# Patient Record
Sex: Female | Born: 1950 | Race: White | Hispanic: No | Marital: Married | Smoking: Former smoker
Health system: Southern US, Community
[De-identification: ages and names within clinical notes are randomized; demographics above are authoritative.]

## PROBLEM LIST (undated history)

## (undated) DIAGNOSIS — E785 Hyperlipidemia, unspecified: Secondary | ICD-10-CM

## (undated) DIAGNOSIS — F419 Anxiety disorder, unspecified: Secondary | ICD-10-CM

## (undated) DIAGNOSIS — I7300 Raynaud's syndrome without gangrene: Secondary | ICD-10-CM

## (undated) DIAGNOSIS — M8580 Other specified disorders of bone density and structure, unspecified site: Secondary | ICD-10-CM

## (undated) DIAGNOSIS — M47816 Spondylosis without myelopathy or radiculopathy, lumbar region: Secondary | ICD-10-CM

## (undated) DIAGNOSIS — H9193 Unspecified hearing loss, bilateral: Secondary | ICD-10-CM

## (undated) DIAGNOSIS — I8000 Phlebitis and thrombophlebitis of superficial vessels of unspecified lower extremity: Secondary | ICD-10-CM

## (undated) DIAGNOSIS — S5420XA Injury of radial nerve at forearm level, unspecified arm, initial encounter: Secondary | ICD-10-CM

## (undated) DIAGNOSIS — R7302 Impaired glucose tolerance (oral): Secondary | ICD-10-CM

## (undated) DIAGNOSIS — C801 Malignant (primary) neoplasm, unspecified: Secondary | ICD-10-CM

## (undated) DIAGNOSIS — K5792 Diverticulitis of intestine, part unspecified, without perforation or abscess without bleeding: Secondary | ICD-10-CM

## (undated) DIAGNOSIS — Z9889 Other specified postprocedural states: Secondary | ICD-10-CM

## (undated) DIAGNOSIS — I82409 Acute embolism and thrombosis of unspecified deep veins of unspecified lower extremity: Secondary | ICD-10-CM

## (undated) DIAGNOSIS — K5730 Diverticulosis of large intestine without perforation or abscess without bleeding: Secondary | ICD-10-CM

## (undated) DIAGNOSIS — R112 Nausea with vomiting, unspecified: Secondary | ICD-10-CM

## (undated) HISTORY — DX: Phlebitis and thrombophlebitis of superficial vessels of unspecified lower extremity: I80.00

## (undated) HISTORY — DX: Injury of radial nerve at forearm level, unspecified arm, initial encounter: S54.20XA

## (undated) HISTORY — DX: Impaired glucose tolerance (oral): R73.02

## (undated) HISTORY — DX: Anxiety disorder, unspecified: F41.9

## (undated) HISTORY — DX: Raynaud's syndrome without gangrene: I73.00

## (undated) HISTORY — DX: Acute embolism and thrombosis of unspecified deep veins of unspecified lower extremity: I82.409

## (undated) HISTORY — PX: BREAST SURGERY: SHX581

## (undated) HISTORY — DX: Spondylosis without myelopathy or radiculopathy, lumbar region: M47.816

## (undated) HISTORY — DX: Diverticulosis of large intestine without perforation or abscess without bleeding: K57.30

## (undated) HISTORY — DX: Diverticulitis of intestine, part unspecified, without perforation or abscess without bleeding: K57.92

## (undated) HISTORY — PX: TONSILLECTOMY: SHX5217

## (undated) HISTORY — DX: Malignant (primary) neoplasm, unspecified: C80.1

## (undated) HISTORY — DX: Unspecified hearing loss, bilateral: H91.93

## (undated) HISTORY — DX: Other specified postprocedural states: R11.2

## (undated) HISTORY — DX: Other specified postprocedural states: Z98.890

## (undated) HISTORY — DX: Hyperlipidemia, unspecified: E78.5

## (undated) HISTORY — DX: Other specified disorders of bone density and structure, unspecified site: M85.80

---

## 1975-06-17 DIAGNOSIS — I82409 Acute embolism and thrombosis of unspecified deep veins of unspecified lower extremity: Secondary | ICD-10-CM

## 1975-06-17 HISTORY — DX: Acute embolism and thrombosis of unspecified deep veins of unspecified lower extremity: I82.409

## 1981-06-16 HISTORY — PX: TOTAL ABDOMINAL HYSTERECTOMY W/ BILATERAL SALPINGOOPHORECTOMY: SHX83

## 1981-06-16 HISTORY — PX: HEMORRHOID SURGERY: SHX153

## 1998-06-07 ENCOUNTER — Other Ambulatory Visit: Admission: RE | Admit: 1998-06-07 | Discharge: 1998-06-07 | Payer: Self-pay | Admitting: Gynecology

## 1999-06-12 ENCOUNTER — Ambulatory Visit (HOSPITAL_COMMUNITY): Admission: RE | Admit: 1999-06-12 | Discharge: 1999-06-12 | Payer: Self-pay | Admitting: Neurosurgery

## 1999-06-12 ENCOUNTER — Encounter: Payer: Self-pay | Admitting: Neurosurgery

## 1999-06-24 ENCOUNTER — Other Ambulatory Visit: Admission: RE | Admit: 1999-06-24 | Discharge: 1999-06-24 | Payer: Self-pay | Admitting: Gynecology

## 1999-06-28 ENCOUNTER — Ambulatory Visit (HOSPITAL_COMMUNITY): Admission: RE | Admit: 1999-06-28 | Discharge: 1999-06-28 | Payer: Self-pay | Admitting: Neurosurgery

## 1999-06-28 ENCOUNTER — Encounter: Payer: Self-pay | Admitting: Neurosurgery

## 1999-07-12 ENCOUNTER — Ambulatory Visit (HOSPITAL_COMMUNITY): Admission: RE | Admit: 1999-07-12 | Discharge: 1999-07-12 | Payer: Self-pay | Admitting: Neurosurgery

## 1999-07-12 ENCOUNTER — Encounter: Payer: Self-pay | Admitting: Neurosurgery

## 2000-06-16 HISTORY — PX: BACK SURGERY: SHX140

## 2000-06-29 ENCOUNTER — Other Ambulatory Visit: Admission: RE | Admit: 2000-06-29 | Discharge: 2000-06-29 | Payer: Self-pay | Admitting: Gynecology

## 2000-09-26 ENCOUNTER — Ambulatory Visit (HOSPITAL_COMMUNITY): Admission: RE | Admit: 2000-09-26 | Discharge: 2000-09-26 | Payer: Self-pay | Admitting: Neurosurgery

## 2000-09-26 ENCOUNTER — Encounter: Payer: Self-pay | Admitting: Neurosurgery

## 2000-12-18 ENCOUNTER — Encounter: Payer: Self-pay | Admitting: Neurosurgery

## 2000-12-22 ENCOUNTER — Encounter: Payer: Self-pay | Admitting: Neurosurgery

## 2000-12-22 ENCOUNTER — Inpatient Hospital Stay (HOSPITAL_COMMUNITY): Admission: RE | Admit: 2000-12-22 | Discharge: 2000-12-26 | Payer: Self-pay | Admitting: Neurosurgery

## 2001-01-06 ENCOUNTER — Encounter: Admission: RE | Admit: 2001-01-06 | Discharge: 2001-01-06 | Payer: Self-pay | Admitting: Neurosurgery

## 2001-01-06 ENCOUNTER — Encounter: Payer: Self-pay | Admitting: Neurosurgery

## 2001-07-05 ENCOUNTER — Other Ambulatory Visit: Admission: RE | Admit: 2001-07-05 | Discharge: 2001-07-05 | Payer: Self-pay | Admitting: Gynecology

## 2002-07-14 ENCOUNTER — Other Ambulatory Visit: Admission: RE | Admit: 2002-07-14 | Discharge: 2002-07-14 | Payer: Self-pay | Admitting: Gynecology

## 2002-11-11 ENCOUNTER — Ambulatory Visit (HOSPITAL_COMMUNITY): Admission: RE | Admit: 2002-11-11 | Discharge: 2002-11-11 | Payer: Self-pay | Admitting: Internal Medicine

## 2003-07-31 ENCOUNTER — Other Ambulatory Visit: Admission: RE | Admit: 2003-07-31 | Discharge: 2003-07-31 | Payer: Self-pay | Admitting: Gynecology

## 2004-08-14 ENCOUNTER — Other Ambulatory Visit: Admission: RE | Admit: 2004-08-14 | Discharge: 2004-08-14 | Payer: Self-pay | Admitting: Gynecology

## 2004-10-15 ENCOUNTER — Ambulatory Visit: Payer: Self-pay | Admitting: Internal Medicine

## 2004-10-28 ENCOUNTER — Ambulatory Visit: Payer: Self-pay | Admitting: Internal Medicine

## 2004-11-05 ENCOUNTER — Ambulatory Visit (HOSPITAL_COMMUNITY): Admission: RE | Admit: 2004-11-05 | Discharge: 2004-11-05 | Payer: Self-pay | Admitting: Internal Medicine

## 2005-03-25 ENCOUNTER — Ambulatory Visit: Payer: Self-pay | Admitting: Internal Medicine

## 2005-05-01 ENCOUNTER — Ambulatory Visit (HOSPITAL_COMMUNITY): Admission: RE | Admit: 2005-05-01 | Discharge: 2005-05-02 | Payer: Self-pay | Admitting: Ophthalmology

## 2005-08-19 ENCOUNTER — Other Ambulatory Visit: Admission: RE | Admit: 2005-08-19 | Discharge: 2005-08-19 | Payer: Self-pay | Admitting: Gynecology

## 2006-08-24 ENCOUNTER — Other Ambulatory Visit: Admission: RE | Admit: 2006-08-24 | Discharge: 2006-08-24 | Payer: Self-pay | Admitting: Gynecology

## 2006-12-03 ENCOUNTER — Ambulatory Visit: Payer: Self-pay | Admitting: Internal Medicine

## 2006-12-03 LAB — CONVERTED CEMR LAB
ALT: 54 units/L — ABNORMAL HIGH (ref 0–40)
AST: 50 units/L — ABNORMAL HIGH (ref 0–37)
Basophils Absolute: 0 10*3/uL (ref 0.0–0.1)
Bilirubin Urine: NEGATIVE
Bilirubin, Direct: 0.1 mg/dL (ref 0.0–0.3)
CO2: 31 meq/L (ref 19–32)
Calcium: 9.4 mg/dL (ref 8.4–10.5)
Direct LDL: 139.4 mg/dL
Eosinophils Absolute: 0.1 10*3/uL (ref 0.0–0.6)
Eosinophils Relative: 2.5 % (ref 0.0–5.0)
GFR calc Af Amer: 95 mL/min
Glucose, Bld: 87 mg/dL (ref 70–99)
HCT: 41.3 % (ref 36.0–46.0)
Hemoglobin, Urine: NEGATIVE
Ketones, ur: NEGATIVE mg/dL
Lymphocytes Relative: 20.6 % (ref 12.0–46.0)
MCHC: 34 g/dL (ref 30.0–36.0)
MCV: 89.3 fL (ref 78.0–100.0)
Monocytes Absolute: 0.3 10*3/uL (ref 0.2–0.7)
Mucus, UA: NEGATIVE
Neutro Abs: 3.3 10*3/uL (ref 1.4–7.7)
Neutrophils Relative %: 68.6 % (ref 43.0–77.0)
Nitrite: NEGATIVE
RBC: 4.63 M/uL (ref 3.87–5.11)
Sodium: 144 meq/L (ref 135–145)
Total Protein, Urine: NEGATIVE mg/dL
Total Protein: 6.6 g/dL (ref 6.0–8.3)
Triglycerides: 181 mg/dL — ABNORMAL HIGH (ref 0–149)
Urine Glucose: NEGATIVE mg/dL
Urobilinogen, UA: 0.2 (ref 0.0–1.0)
WBC: 4.6 10*3/uL (ref 4.5–10.5)
pH: 6.5 (ref 5.0–8.0)

## 2006-12-07 ENCOUNTER — Ambulatory Visit: Payer: Self-pay | Admitting: Internal Medicine

## 2006-12-07 LAB — CONVERTED CEMR LAB
Albumin: 4.1 g/dL (ref 3.5–5.2)
Hep A IgM: NEGATIVE
Hep B C IgM: NEGATIVE
Hepatitis B Surface Ag: NEGATIVE
Total Bilirubin: 0.4 mg/dL (ref 0.3–1.2)
Total Protein: 6.7 g/dL (ref 6.0–8.3)

## 2006-12-09 ENCOUNTER — Ambulatory Visit: Payer: Self-pay | Admitting: Gastroenterology

## 2007-09-01 ENCOUNTER — Ambulatory Visit: Payer: Self-pay | Admitting: Internal Medicine

## 2007-09-01 DIAGNOSIS — I8000 Phlebitis and thrombophlebitis of superficial vessels of unspecified lower extremity: Secondary | ICD-10-CM | POA: Insufficient documentation

## 2007-09-01 DIAGNOSIS — E739 Lactose intolerance, unspecified: Secondary | ICD-10-CM

## 2007-09-01 DIAGNOSIS — K649 Unspecified hemorrhoids: Secondary | ICD-10-CM | POA: Insufficient documentation

## 2007-09-01 DIAGNOSIS — J189 Pneumonia, unspecified organism: Secondary | ICD-10-CM

## 2007-09-01 DIAGNOSIS — K5730 Diverticulosis of large intestine without perforation or abscess without bleeding: Secondary | ICD-10-CM | POA: Insufficient documentation

## 2007-09-01 DIAGNOSIS — E785 Hyperlipidemia, unspecified: Secondary | ICD-10-CM | POA: Insufficient documentation

## 2007-09-01 DIAGNOSIS — I82409 Acute embolism and thrombosis of unspecified deep veins of unspecified lower extremity: Secondary | ICD-10-CM | POA: Insufficient documentation

## 2007-09-01 DIAGNOSIS — Z8719 Personal history of other diseases of the digestive system: Secondary | ICD-10-CM

## 2007-09-01 DIAGNOSIS — I7300 Raynaud's syndrome without gangrene: Secondary | ICD-10-CM | POA: Insufficient documentation

## 2007-09-01 DIAGNOSIS — Z87898 Personal history of other specified conditions: Secondary | ICD-10-CM

## 2007-09-01 DIAGNOSIS — Z86718 Personal history of other venous thrombosis and embolism: Secondary | ICD-10-CM

## 2007-09-01 DIAGNOSIS — F411 Generalized anxiety disorder: Secondary | ICD-10-CM

## 2007-09-01 DIAGNOSIS — J309 Allergic rhinitis, unspecified: Secondary | ICD-10-CM

## 2007-09-01 DIAGNOSIS — J9801 Acute bronchospasm: Secondary | ICD-10-CM | POA: Insufficient documentation

## 2007-09-01 HISTORY — DX: Phlebitis and thrombophlebitis of superficial vessels of unspecified lower extremity: I80.00

## 2007-12-20 ENCOUNTER — Telehealth: Payer: Self-pay | Admitting: Internal Medicine

## 2007-12-20 ENCOUNTER — Ambulatory Visit: Payer: Self-pay | Admitting: Internal Medicine

## 2007-12-23 LAB — CONVERTED CEMR LAB
ALT: 17 units/L (ref 0–35)
Basophils Relative: 0.6 % (ref 0.0–1.0)
Bilirubin, Direct: 0.1 mg/dL (ref 0.0–0.3)
CO2: 31 meq/L (ref 19–32)
Calcium: 9.6 mg/dL (ref 8.4–10.5)
Eosinophils Relative: 3.1 % (ref 0.0–5.0)
Glucose, Bld: 91 mg/dL (ref 70–99)
Hemoglobin: 14.1 g/dL (ref 12.0–15.0)
Lymphocytes Relative: 25.1 % (ref 12.0–46.0)
Monocytes Relative: 8.3 % (ref 3.0–12.0)
Neutro Abs: 2.5 10*3/uL (ref 1.4–7.7)
Nitrite: NEGATIVE
RBC: 4.5 M/uL (ref 3.87–5.11)
Sodium: 146 meq/L — ABNORMAL HIGH (ref 135–145)
Specific Gravity, Urine: 1.015 (ref 1.000–1.03)
TSH: 1.89 microintl units/mL (ref 0.35–5.50)
Total CHOL/HDL Ratio: 5.2
Total Protein: 6.4 g/dL (ref 6.0–8.3)
WBC: 3.9 10*3/uL — ABNORMAL LOW (ref 4.5–10.5)
pH: 8 (ref 5.0–8.0)

## 2008-01-04 ENCOUNTER — Ambulatory Visit: Payer: Self-pay | Admitting: Internal Medicine

## 2008-01-04 DIAGNOSIS — J019 Acute sinusitis, unspecified: Secondary | ICD-10-CM

## 2008-03-03 ENCOUNTER — Ambulatory Visit: Payer: Self-pay | Admitting: Internal Medicine

## 2008-03-03 LAB — CONVERTED CEMR LAB
HDL: 43.7 mg/dL (ref >39.0)
LDL Cholesterol: 103 mg/dL — ABNORMAL HIGH (ref 0–99)
Total Bilirubin: 0.6 mg/dL (ref 0.3–1.2)
Total CHOL/HDL Ratio: 3.8
VLDL: 17 mg/dL (ref 0–40)

## 2008-03-13 ENCOUNTER — Telehealth (INDEPENDENT_AMBULATORY_CARE_PROVIDER_SITE_OTHER): Payer: Self-pay | Admitting: *Deleted

## 2008-03-14 ENCOUNTER — Ambulatory Visit: Payer: Self-pay | Admitting: Internal Medicine

## 2008-03-14 DIAGNOSIS — S5420XA Injury of radial nerve at forearm level, unspecified arm, initial encounter: Secondary | ICD-10-CM | POA: Insufficient documentation

## 2009-06-07 ENCOUNTER — Ambulatory Visit: Payer: Self-pay | Admitting: Internal Medicine

## 2009-06-07 DIAGNOSIS — R062 Wheezing: Secondary | ICD-10-CM

## 2009-06-16 HISTORY — PX: BREAST LUMPECTOMY: SHX2

## 2009-06-27 ENCOUNTER — Encounter: Payer: Self-pay | Admitting: Internal Medicine

## 2009-07-09 ENCOUNTER — Encounter: Payer: Self-pay | Admitting: Internal Medicine

## 2009-07-13 ENCOUNTER — Encounter: Payer: Self-pay | Admitting: Internal Medicine

## 2009-07-16 ENCOUNTER — Encounter: Payer: Self-pay | Admitting: Internal Medicine

## 2009-07-18 ENCOUNTER — Encounter: Admission: RE | Admit: 2009-07-18 | Discharge: 2009-07-18 | Payer: Self-pay | Admitting: Radiology

## 2009-07-18 ENCOUNTER — Ambulatory Visit: Payer: Self-pay | Admitting: Oncology

## 2009-07-23 ENCOUNTER — Encounter: Payer: Self-pay | Admitting: Internal Medicine

## 2009-07-25 ENCOUNTER — Telehealth: Payer: Self-pay | Admitting: Internal Medicine

## 2009-07-25 ENCOUNTER — Ambulatory Visit (HOSPITAL_COMMUNITY): Admission: RE | Admit: 2009-07-25 | Discharge: 2009-07-25 | Payer: Self-pay | Admitting: Oncology

## 2009-07-25 DIAGNOSIS — C50919 Malignant neoplasm of unspecified site of unspecified female breast: Secondary | ICD-10-CM | POA: Insufficient documentation

## 2009-08-01 ENCOUNTER — Encounter: Payer: Self-pay | Admitting: Internal Medicine

## 2009-08-01 LAB — CBC WITH DIFFERENTIAL/PLATELET
Basophils Absolute: 0 10*3/uL (ref 0.0–0.1)
Eosinophils Absolute: 0 10*3/uL (ref 0.0–0.5)
HCT: 39.4 % (ref 34.8–46.6)
HGB: 13.6 g/dL (ref 11.6–15.9)
LYMPH%: 24.2 % (ref 14.0–49.7)
MCV: 90 fL (ref 79.5–101.0)
MONO#: 0.2 10*3/uL (ref 0.1–0.9)
MONO%: 6.7 % (ref 0.0–14.0)
NEUT#: 2.5 10*3/uL (ref 1.5–6.5)
Platelets: 259 10*3/uL (ref 145–400)

## 2009-08-01 LAB — COMPREHENSIVE METABOLIC PANEL
Albumin: 4.7 g/dL (ref 3.5–5.2)
Alkaline Phosphatase: 46 U/L (ref 39–117)
BUN: 10 mg/dL (ref 6–23)
CO2: 26 mEq/L (ref 19–32)
Glucose, Bld: 90 mg/dL (ref 70–99)
Total Bilirubin: 0.4 mg/dL (ref 0.3–1.2)

## 2009-08-01 LAB — CANCER ANTIGEN 27.29: CA 27.29: 9 U/mL (ref 0–39)

## 2009-08-08 ENCOUNTER — Ambulatory Visit: Admission: RE | Admit: 2009-08-08 | Discharge: 2009-08-30 | Payer: Self-pay | Admitting: Radiation Oncology

## 2009-08-08 ENCOUNTER — Ambulatory Visit (HOSPITAL_COMMUNITY): Admission: RE | Admit: 2009-08-08 | Discharge: 2009-08-08 | Payer: Self-pay | Admitting: General Surgery

## 2009-08-10 ENCOUNTER — Encounter: Payer: Self-pay | Admitting: Internal Medicine

## 2009-08-15 ENCOUNTER — Encounter: Payer: Self-pay | Admitting: Oncology

## 2009-08-15 ENCOUNTER — Ambulatory Visit: Payer: Self-pay | Admitting: Cardiovascular Disease

## 2009-08-15 ENCOUNTER — Ambulatory Visit: Admission: RE | Admit: 2009-08-15 | Discharge: 2009-08-15 | Payer: Self-pay | Admitting: Oncology

## 2009-08-18 ENCOUNTER — Ambulatory Visit: Payer: Self-pay | Admitting: Oncology

## 2009-08-23 LAB — CBC WITH DIFFERENTIAL/PLATELET
BASO%: 2.2 % — ABNORMAL HIGH (ref 0.0–2.0)
EOS%: 10.8 % — ABNORMAL HIGH (ref 0.0–7.0)
HCT: 32.8 % — ABNORMAL LOW (ref 34.8–46.6)
LYMPH%: 48.4 % (ref 14.0–49.7)
MCH: 29.9 pg (ref 25.1–34.0)
MCHC: 34.1 g/dL (ref 31.5–36.0)
MONO#: 0.1 10*3/uL (ref 0.1–0.9)
NEUT%: 30 % — ABNORMAL LOW (ref 38.4–76.8)
Platelets: 114 10*3/uL — ABNORMAL LOW (ref 145–400)
RBC: 3.75 10*6/uL (ref 3.70–5.45)
WBC: 0.9 10*3/uL — CL (ref 3.9–10.3)
nRBC: 0 % (ref 0–0)

## 2009-08-26 ENCOUNTER — Encounter: Payer: Self-pay | Admitting: Internal Medicine

## 2009-08-30 ENCOUNTER — Encounter: Payer: Self-pay | Admitting: Internal Medicine

## 2009-08-30 LAB — CBC WITH DIFFERENTIAL/PLATELET
Basophils Absolute: 0 10*3/uL (ref 0.0–0.1)
EOS%: 0.1 % (ref 0.0–7.0)
HCT: 38.7 % (ref 34.8–46.6)
HGB: 12.9 g/dL (ref 11.6–15.9)
MCH: 29.5 pg (ref 25.1–34.0)
MCV: 88.6 fL (ref 79.5–101.0)
MONO%: 6.3 % (ref 0.0–14.0)
NEUT%: 80.5 % — ABNORMAL HIGH (ref 38.4–76.8)

## 2009-09-06 ENCOUNTER — Encounter: Payer: Self-pay | Admitting: Internal Medicine

## 2009-09-06 LAB — CBC WITH DIFFERENTIAL/PLATELET
Basophils Absolute: 0 10*3/uL (ref 0.0–0.1)
EOS%: 0 % (ref 0.0–7.0)
HGB: 10.3 g/dL — ABNORMAL LOW (ref 11.6–15.9)
MCH: 29.8 pg (ref 25.1–34.0)
MCV: 89 fL (ref 79.5–101.0)
MONO%: 20.7 % — ABNORMAL HIGH (ref 0.0–14.0)
RBC: 3.46 10*6/uL — ABNORMAL LOW (ref 3.70–5.45)
RDW: 12.5 % (ref 11.2–14.5)

## 2009-09-13 ENCOUNTER — Encounter: Payer: Self-pay | Admitting: Internal Medicine

## 2009-09-13 LAB — CBC WITH DIFFERENTIAL/PLATELET
BASO%: 0.9 % (ref 0.0–2.0)
EOS%: 0.1 % (ref 0.0–7.0)
LYMPH%: 10.2 % — ABNORMAL LOW (ref 14.0–49.7)
MCH: 30 pg (ref 25.1–34.0)
MCHC: 33.2 g/dL (ref 31.5–36.0)
MONO#: 1 10*3/uL — ABNORMAL HIGH (ref 0.1–0.9)
MONO%: 10.7 % (ref 0.0–14.0)
Platelets: 184 10*3/uL (ref 145–400)
RBC: 4.03 10*6/uL (ref 3.70–5.45)
WBC: 9.3 10*3/uL (ref 3.9–10.3)
nRBC: 0 % (ref 0–0)

## 2009-09-13 LAB — COMPREHENSIVE METABOLIC PANEL
ALT: 51 U/L — ABNORMAL HIGH (ref 0–35)
AST: 34 U/L (ref 0–37)
Alkaline Phosphatase: 110 U/L (ref 39–117)
Chloride: 103 mEq/L (ref 96–112)
Creatinine, Ser: 0.6 mg/dL (ref 0.40–1.20)
Total Bilirubin: 0.4 mg/dL (ref 0.3–1.2)

## 2009-09-18 ENCOUNTER — Ambulatory Visit: Payer: Self-pay | Admitting: Oncology

## 2009-09-20 ENCOUNTER — Encounter: Payer: Self-pay | Admitting: Internal Medicine

## 2009-09-20 LAB — CBC WITH DIFFERENTIAL/PLATELET
Basophils Absolute: 0 10*3/uL (ref 0.0–0.1)
EOS%: 0.1 % (ref 0.0–7.0)
Eosinophils Absolute: 0 10*3/uL (ref 0.0–0.5)
HCT: 31 % — ABNORMAL LOW (ref 34.8–46.6)
HGB: 10.5 g/dL — ABNORMAL LOW (ref 11.6–15.9)
MCH: 30.8 pg (ref 25.1–34.0)
MCV: 91.2 fL (ref 79.5–101.0)
MONO%: 4.7 % (ref 0.0–14.0)
NEUT%: 81.5 % — ABNORMAL HIGH (ref 38.4–76.8)
lymph#: 0.4 10*3/uL — ABNORMAL LOW (ref 0.9–3.3)

## 2009-09-27 ENCOUNTER — Encounter: Payer: Self-pay | Admitting: Internal Medicine

## 2009-09-27 LAB — CBC WITH DIFFERENTIAL/PLATELET
Basophils Absolute: 0.2 10*3/uL — ABNORMAL HIGH (ref 0.0–0.1)
EOS%: 0.1 % (ref 0.0–7.0)
HGB: 12.3 g/dL (ref 11.6–15.9)
LYMPH%: 9.8 % — ABNORMAL LOW (ref 14.0–49.7)
MCH: 30.3 pg (ref 25.1–34.0)
MCV: 90.4 fL (ref 79.5–101.0)
MONO%: 9.5 % (ref 0.0–14.0)
Platelets: 198 10*3/uL (ref 145–400)
RDW: 15.5 % — ABNORMAL HIGH (ref 11.2–14.5)

## 2009-10-04 ENCOUNTER — Ambulatory Visit (HOSPITAL_COMMUNITY): Admission: RE | Admit: 2009-10-04 | Discharge: 2009-10-04 | Payer: Self-pay | Admitting: Oncology

## 2009-10-08 ENCOUNTER — Encounter: Payer: Self-pay | Admitting: Internal Medicine

## 2009-10-08 LAB — CBC WITH DIFFERENTIAL/PLATELET
BASO%: 0.2 % (ref 0.0–2.0)
EOS%: 0.1 % (ref 0.0–7.0)
LYMPH%: 5.6 % — ABNORMAL LOW (ref 14.0–49.7)
MCH: 31.8 pg (ref 25.1–34.0)
MCHC: 34 g/dL (ref 31.5–36.0)
MCV: 93.6 fL (ref 79.5–101.0)
MONO#: 0.9 10*3/uL (ref 0.1–0.9)
MONO%: 5.6 % (ref 0.0–14.0)
NEUT%: 88.5 % — ABNORMAL HIGH (ref 38.4–76.8)
Platelets: 299 10*3/uL (ref 145–400)
RBC: 3.52 10*6/uL — ABNORMAL LOW (ref 3.70–5.45)
WBC: 16.6 10*3/uL — ABNORMAL HIGH (ref 3.9–10.3)

## 2009-10-08 LAB — COMPREHENSIVE METABOLIC PANEL
ALT: 29 U/L (ref 0–35)
Alkaline Phosphatase: 112 U/L (ref 39–117)
Creatinine, Ser: 0.6 mg/dL (ref 0.40–1.20)
Sodium: 142 mEq/L (ref 135–145)
Total Bilirubin: 0.5 mg/dL (ref 0.3–1.2)
Total Protein: 6.6 g/dL (ref 6.0–8.3)

## 2009-10-12 LAB — CBC WITH DIFFERENTIAL/PLATELET
BASO%: 0.4 % (ref 0.0–2.0)
Eosinophils Absolute: 0 10*3/uL (ref 0.0–0.5)
HCT: 36.3 % (ref 34.8–46.6)
LYMPH%: 7.4 % — ABNORMAL LOW (ref 14.0–49.7)
MCHC: 33.3 g/dL (ref 31.5–36.0)
MCV: 91.4 fL (ref 79.5–101.0)
MONO#: 1.3 10*3/uL — ABNORMAL HIGH (ref 0.1–0.9)
MONO%: 7.5 % (ref 0.0–14.0)
NEUT%: 84.6 % — ABNORMAL HIGH (ref 38.4–76.8)
Platelets: 179 10*3/uL (ref 145–400)
RBC: 3.97 10*6/uL (ref 3.70–5.45)

## 2009-10-18 ENCOUNTER — Ambulatory Visit: Payer: Self-pay | Admitting: Oncology

## 2009-10-19 ENCOUNTER — Encounter: Payer: Self-pay | Admitting: Internal Medicine

## 2009-10-19 LAB — CBC WITH DIFFERENTIAL/PLATELET
Eosinophils Absolute: 0 10*3/uL (ref 0.0–0.5)
LYMPH%: 14.2 % (ref 14.0–49.7)
MONO#: 0.4 10*3/uL (ref 0.1–0.9)
NEUT#: 4.4 10*3/uL (ref 1.5–6.5)
Platelets: 260 10*3/uL (ref 145–400)
RBC: 3.5 10*6/uL — ABNORMAL LOW (ref 3.70–5.45)
WBC: 5.7 10*3/uL (ref 3.9–10.3)
lymph#: 0.8 10*3/uL — ABNORMAL LOW (ref 0.9–3.3)

## 2009-10-26 ENCOUNTER — Encounter: Payer: Self-pay | Admitting: Internal Medicine

## 2009-10-26 LAB — CBC WITH DIFFERENTIAL/PLATELET
Basophils Absolute: 0 10*3/uL (ref 0.0–0.1)
EOS%: 0.1 % (ref 0.0–7.0)
HGB: 10.8 g/dL — ABNORMAL LOW (ref 11.6–15.9)
MCH: 32.1 pg (ref 25.1–34.0)
MCHC: 35 g/dL (ref 31.5–36.0)
MCV: 91.6 fL (ref 79.5–101.0)
MONO%: 7.4 % (ref 0.0–14.0)
RBC: 3.38 10*6/uL — ABNORMAL LOW (ref 3.70–5.45)
RDW: 16.6 % — ABNORMAL HIGH (ref 11.2–14.5)

## 2009-10-26 LAB — COMPREHENSIVE METABOLIC PANEL
AST: 55 U/L — ABNORMAL HIGH (ref 0–37)
Albumin: 3.7 g/dL (ref 3.5–5.2)
Alkaline Phosphatase: 58 U/L (ref 39–117)
BUN: 6 mg/dL (ref 6–23)
Potassium: 3.2 mEq/L — ABNORMAL LOW (ref 3.5–5.3)

## 2009-11-01 ENCOUNTER — Encounter: Payer: Self-pay | Admitting: Internal Medicine

## 2009-11-01 LAB — CBC WITH DIFFERENTIAL/PLATELET
Basophils Absolute: 0 10*3/uL (ref 0.0–0.1)
EOS%: 3 % (ref 0.0–7.0)
HCT: 33.5 % — ABNORMAL LOW (ref 34.8–46.6)
HGB: 11.4 g/dL — ABNORMAL LOW (ref 11.6–15.9)
LYMPH%: 21.3 % (ref 14.0–49.7)
MCH: 31 pg (ref 25.1–34.0)
MCV: 90.7 fL (ref 79.5–101.0)
MONO%: 12.4 % (ref 0.0–14.0)
NEUT%: 62.6 % (ref 38.4–76.8)
RDW: 16.6 % — ABNORMAL HIGH (ref 11.2–14.5)

## 2009-11-01 LAB — COMPREHENSIVE METABOLIC PANEL
AST: 26 U/L (ref 0–37)
Alkaline Phosphatase: 67 U/L (ref 39–117)
BUN: 9 mg/dL (ref 6–23)
Creatinine, Ser: 0.64 mg/dL (ref 0.40–1.20)

## 2009-11-06 ENCOUNTER — Inpatient Hospital Stay (HOSPITAL_COMMUNITY): Admission: EM | Admit: 2009-11-06 | Discharge: 2009-11-08 | Payer: Self-pay | Admitting: Emergency Medicine

## 2009-11-16 ENCOUNTER — Encounter: Payer: Self-pay | Admitting: Internal Medicine

## 2009-11-16 LAB — CBC WITH DIFFERENTIAL/PLATELET
Basophils Absolute: 0 10*3/uL (ref 0.0–0.1)
Eosinophils Absolute: 0.2 10*3/uL (ref 0.0–0.5)
HGB: 12 g/dL (ref 11.6–15.9)
MCV: 90 fL (ref 79.5–101.0)
MONO%: 13 % (ref 0.0–14.0)
NEUT#: 2.9 10*3/uL (ref 1.5–6.5)
RBC: 4.02 10*6/uL (ref 3.70–5.45)
RDW: 14.8 % — ABNORMAL HIGH (ref 11.2–14.5)
WBC: 4.4 10*3/uL (ref 3.9–10.3)
lymph#: 0.7 10*3/uL — ABNORMAL LOW (ref 0.9–3.3)

## 2009-11-21 ENCOUNTER — Ambulatory Visit: Payer: Self-pay | Admitting: Oncology

## 2009-11-23 ENCOUNTER — Encounter: Payer: Self-pay | Admitting: Internal Medicine

## 2009-11-23 LAB — CBC WITH DIFFERENTIAL/PLATELET
Basophils Absolute: 0 10*3/uL (ref 0.0–0.1)
Eosinophils Absolute: 0 10*3/uL (ref 0.0–0.5)
HGB: 10.7 g/dL — ABNORMAL LOW (ref 11.6–15.9)
LYMPH%: 53 % — ABNORMAL HIGH (ref 14.0–49.7)
MCV: 88.7 fL (ref 79.5–101.0)
MONO#: 0 10*3/uL — ABNORMAL LOW (ref 0.1–0.9)
MONO%: 4.5 % (ref 0.0–14.0)
NEUT#: 0.3 10*3/uL — CL (ref 1.5–6.5)
Platelets: 87 10*3/uL — ABNORMAL LOW (ref 145–400)
RBC: 3.63 10*6/uL — ABNORMAL LOW (ref 3.70–5.45)
RDW: 13.6 % (ref 11.2–14.5)
WBC: 0.7 10*3/uL — CL (ref 3.9–10.3)

## 2009-11-30 ENCOUNTER — Encounter: Payer: Self-pay | Admitting: Internal Medicine

## 2009-11-30 LAB — COMPREHENSIVE METABOLIC PANEL
ALT: 99 U/L — ABNORMAL HIGH (ref 0–35)
Alkaline Phosphatase: 130 U/L — ABNORMAL HIGH (ref 39–117)
CO2: 27 mEq/L (ref 19–32)
Creatinine, Ser: 0.66 mg/dL (ref 0.40–1.20)
Total Bilirubin: 0.2 mg/dL — ABNORMAL LOW (ref 0.3–1.2)

## 2009-11-30 LAB — CBC WITH DIFFERENTIAL/PLATELET
BASO%: 0 % (ref 0.0–2.0)
EOS%: 0.2 % (ref 0.0–7.0)
HCT: 29 % — ABNORMAL LOW (ref 34.8–46.6)
LYMPH%: 8.1 % — ABNORMAL LOW (ref 14.0–49.7)
MCH: 31.1 pg (ref 25.1–34.0)
MCHC: 34.7 g/dL (ref 31.5–36.0)
MCV: 89.8 fL (ref 79.5–101.0)
MONO#: 0.5 10*3/uL (ref 0.1–0.9)
MONO%: 4.5 % (ref 0.0–14.0)
NEUT%: 87.2 % — ABNORMAL HIGH (ref 38.4–76.8)
Platelets: 56 10*3/uL — ABNORMAL LOW (ref 145–400)
RBC: 3.23 10*6/uL — ABNORMAL LOW (ref 3.70–5.45)

## 2009-12-07 ENCOUNTER — Encounter: Payer: Self-pay | Admitting: Internal Medicine

## 2009-12-07 LAB — CBC WITH DIFFERENTIAL/PLATELET
Eosinophils Absolute: 0 10*3/uL (ref 0.0–0.5)
MONO#: 0.5 10*3/uL (ref 0.1–0.9)
NEUT#: 5.6 10*3/uL (ref 1.5–6.5)
Platelets: 190 10*3/uL (ref 145–400)
RBC: 3.54 10*6/uL — ABNORMAL LOW (ref 3.70–5.45)
RDW: 16.3 % — ABNORMAL HIGH (ref 11.2–14.5)
WBC: 6.9 10*3/uL (ref 3.9–10.3)

## 2009-12-07 LAB — COMPREHENSIVE METABOLIC PANEL
Albumin: 4.2 g/dL (ref 3.5–5.2)
CO2: 23 mEq/L (ref 19–32)
Glucose, Bld: 77 mg/dL (ref 70–99)
Potassium: 3.9 mEq/L (ref 3.5–5.3)
Sodium: 140 mEq/L (ref 135–145)
Total Protein: 6.4 g/dL (ref 6.0–8.3)

## 2009-12-10 ENCOUNTER — Encounter: Payer: Self-pay | Admitting: Internal Medicine

## 2009-12-10 LAB — CBC WITH DIFFERENTIAL/PLATELET
Eosinophils Absolute: 0 10*3/uL (ref 0.0–0.5)
MCV: 93.8 fL (ref 79.5–101.0)
MONO#: 0.1 10*3/uL (ref 0.1–0.9)
MONO%: 0.6 % (ref 0.0–14.0)
NEUT#: 21.2 10*3/uL — ABNORMAL HIGH (ref 1.5–6.5)
RBC: 3.25 10*6/uL — ABNORMAL LOW (ref 3.70–5.45)
RDW: 17 % — ABNORMAL HIGH (ref 11.2–14.5)
WBC: 21.8 10*3/uL — ABNORMAL HIGH (ref 3.9–10.3)

## 2009-12-14 ENCOUNTER — Encounter: Payer: Self-pay | Admitting: Internal Medicine

## 2009-12-14 LAB — CBC WITH DIFFERENTIAL/PLATELET
BASO%: 2.7 % — ABNORMAL HIGH (ref 0.0–2.0)
Basophils Absolute: 0.1 10*3/uL (ref 0.0–0.1)
Eosinophils Absolute: 0 10*3/uL (ref 0.0–0.5)
HCT: 30.3 % — ABNORMAL LOW (ref 34.8–46.6)
HGB: 10.2 g/dL — ABNORMAL LOW (ref 11.6–15.9)
LYMPH%: 27.5 % (ref 14.0–49.7)
MCHC: 33.7 g/dL (ref 31.5–36.0)
MONO#: 0.1 10*3/uL (ref 0.1–0.9)
NEUT%: 65.7 % (ref 38.4–76.8)
Platelets: 98 10*3/uL — ABNORMAL LOW (ref 145–400)
WBC: 2.2 10*3/uL — ABNORMAL LOW (ref 3.9–10.3)
lymph#: 0.6 10*3/uL — ABNORMAL LOW (ref 0.9–3.3)

## 2009-12-14 LAB — COMPREHENSIVE METABOLIC PANEL
BUN: 12 mg/dL (ref 6–23)
CO2: 25 mEq/L (ref 19–32)
Calcium: 8.8 mg/dL (ref 8.4–10.5)
Chloride: 106 mEq/L (ref 96–112)
Creatinine, Ser: 0.44 mg/dL (ref 0.40–1.20)
Glucose, Bld: 87 mg/dL (ref 70–99)
Total Bilirubin: 0.6 mg/dL (ref 0.3–1.2)

## 2009-12-21 ENCOUNTER — Encounter (HOSPITAL_COMMUNITY): Admission: RE | Admit: 2009-12-21 | Discharge: 2009-12-21 | Payer: Self-pay | Admitting: Oncology

## 2009-12-21 ENCOUNTER — Encounter: Payer: Self-pay | Admitting: Internal Medicine

## 2009-12-21 ENCOUNTER — Ambulatory Visit: Payer: Self-pay | Admitting: Surgery

## 2009-12-21 ENCOUNTER — Ambulatory Visit: Payer: Self-pay | Admitting: Oncology

## 2009-12-21 ENCOUNTER — Encounter: Payer: Self-pay | Admitting: Oncology

## 2009-12-21 LAB — CBC WITH DIFFERENTIAL/PLATELET
BASO%: 0.1 % (ref 0.0–2.0)
Eosinophils Absolute: 0 10*3/uL (ref 0.0–0.5)
HCT: 24.8 % — ABNORMAL LOW (ref 34.8–46.6)
LYMPH%: 5.7 % — ABNORMAL LOW (ref 14.0–49.7)
MCHC: 33.5 g/dL (ref 31.5–36.0)
MONO#: 0.5 10*3/uL (ref 0.1–0.9)
NEUT#: 6.4 10*3/uL (ref 1.5–6.5)
NEUT%: 87.7 % — ABNORMAL HIGH (ref 38.4–76.8)
Platelets: <6 10*3/uL — CL (ref 145–400)
WBC: 7.3 10*3/uL (ref 3.9–10.3)
lymph#: 0.4 10*3/uL — ABNORMAL LOW (ref 0.9–3.3)
nRBC: 0 % (ref 0–0)

## 2009-12-22 ENCOUNTER — Ambulatory Visit: Payer: Self-pay | Admitting: Oncology

## 2009-12-22 ENCOUNTER — Inpatient Hospital Stay (HOSPITAL_COMMUNITY): Admission: AD | Admit: 2009-12-22 | Discharge: 2009-12-23 | Payer: Self-pay | Admitting: Oncology

## 2009-12-24 LAB — CBC WITH DIFFERENTIAL/PLATELET
Basophils Absolute: 0 10*3/uL (ref 0.0–0.1)
Eosinophils Absolute: 0 10*3/uL (ref 0.0–0.5)
HGB: 10 g/dL — ABNORMAL LOW (ref 11.6–15.9)
MCV: 90.3 fL (ref 79.5–101.0)
MONO#: 0.9 10*3/uL (ref 0.1–0.9)
NEUT#: 7.7 10*3/uL — ABNORMAL HIGH (ref 1.5–6.5)
Platelets: 45 10*3/uL — ABNORMAL LOW (ref 145–400)
RBC: 3.16 10*6/uL — ABNORMAL LOW (ref 3.70–5.45)
RDW: 16 % — ABNORMAL HIGH (ref 11.2–14.5)
WBC: 9.4 10*3/uL (ref 3.9–10.3)

## 2009-12-28 ENCOUNTER — Encounter: Payer: Self-pay | Admitting: Internal Medicine

## 2009-12-28 LAB — COMPREHENSIVE METABOLIC PANEL
Albumin: 4 g/dL (ref 3.5–5.2)
BUN: 7 mg/dL (ref 6–23)
CO2: 30 mEq/L (ref 19–32)
Calcium: 8.9 mg/dL (ref 8.4–10.5)
Chloride: 103 mEq/L (ref 96–112)
Glucose, Bld: 89 mg/dL (ref 70–99)
Potassium: 3.9 mEq/L (ref 3.5–5.3)
Sodium: 140 mEq/L (ref 135–145)
Total Protein: 6.4 g/dL (ref 6.0–8.3)

## 2009-12-28 LAB — CBC WITH DIFFERENTIAL/PLATELET
Basophils Absolute: 0.1 10*3/uL (ref 0.0–0.1)
Eosinophils Absolute: 0 10*3/uL (ref 0.0–0.5)
HCT: 33.6 % — ABNORMAL LOW (ref 34.8–46.6)
LYMPH%: 6.3 % — ABNORMAL LOW (ref 14.0–49.7)
MCV: 91.8 fL (ref 79.5–101.0)
MONO#: 2.2 10*3/uL — ABNORMAL HIGH (ref 0.1–0.9)
MONO%: 16.6 % — ABNORMAL HIGH (ref 0.0–14.0)
NEUT#: 10.3 10*3/uL — ABNORMAL HIGH (ref 1.5–6.5)
NEUT%: 76.3 % (ref 38.4–76.8)
Platelets: 138 10*3/uL — ABNORMAL LOW (ref 145–400)
WBC: 13.5 10*3/uL — ABNORMAL HIGH (ref 3.9–10.3)

## 2009-12-31 ENCOUNTER — Ambulatory Visit (HOSPITAL_COMMUNITY): Admission: RE | Admit: 2009-12-31 | Discharge: 2009-12-31 | Payer: Self-pay | Admitting: Oncology

## 2010-01-24 ENCOUNTER — Ambulatory Visit: Payer: Self-pay | Admitting: Oncology

## 2010-01-28 ENCOUNTER — Encounter: Payer: Self-pay | Admitting: Internal Medicine

## 2010-01-28 LAB — CBC WITH DIFFERENTIAL/PLATELET
Eosinophils Absolute: 0.2 10*3/uL (ref 0.0–0.5)
HCT: 37.8 % (ref 34.8–46.6)
LYMPH%: 16.9 % (ref 14.0–49.7)
MONO#: 0.4 10*3/uL (ref 0.1–0.9)
NEUT#: 2.6 10*3/uL (ref 1.5–6.5)
Platelets: 181 10*3/uL (ref 145–400)
RBC: 4.06 10*6/uL (ref 3.70–5.45)
WBC: 3.9 10*3/uL (ref 3.9–10.3)
lymph#: 0.6 10*3/uL — ABNORMAL LOW (ref 0.9–3.3)

## 2010-02-08 ENCOUNTER — Ambulatory Visit (HOSPITAL_COMMUNITY): Admission: RE | Admit: 2010-02-08 | Discharge: 2010-02-09 | Payer: Self-pay | Admitting: General Surgery

## 2010-02-08 ENCOUNTER — Encounter (INDEPENDENT_AMBULATORY_CARE_PROVIDER_SITE_OTHER): Payer: Self-pay | Admitting: General Surgery

## 2010-02-15 ENCOUNTER — Ambulatory Visit
Admission: RE | Admit: 2010-02-15 | Discharge: 2010-05-02 | Payer: Self-pay | Source: Home / Self Care | Admitting: Radiation Oncology

## 2010-02-27 ENCOUNTER — Encounter: Admission: RE | Admit: 2010-02-27 | Discharge: 2010-04-04 | Payer: Self-pay | Admitting: General Surgery

## 2010-03-06 ENCOUNTER — Encounter: Payer: Self-pay | Admitting: Internal Medicine

## 2010-04-15 ENCOUNTER — Ambulatory Visit: Payer: Self-pay | Admitting: Oncology

## 2010-04-15 LAB — CBC WITH DIFFERENTIAL/PLATELET
Eosinophils Absolute: 0.1 10*3/uL (ref 0.0–0.5)
HCT: 38.5 % (ref 34.8–46.6)
HGB: 13.1 g/dL (ref 11.6–15.9)
LYMPH%: 19.8 % (ref 14.0–49.7)
MONO#: 0.3 10*3/uL (ref 0.1–0.9)
NEUT#: 1.9 10*3/uL (ref 1.5–6.5)
NEUT%: 66.8 % (ref 38.4–76.8)
Platelets: 131 10*3/uL — ABNORMAL LOW (ref 145–400)
WBC: 2.8 10*3/uL — ABNORMAL LOW (ref 3.9–10.3)

## 2010-04-15 LAB — COMPREHENSIVE METABOLIC PANEL
CO2: 27 mEq/L (ref 19–32)
Calcium: 9.3 mg/dL (ref 8.4–10.5)
Creatinine, Ser: 0.73 mg/dL (ref 0.40–1.20)
Glucose, Bld: 74 mg/dL (ref 70–99)
Total Bilirubin: 0.3 mg/dL (ref 0.3–1.2)
Total Protein: 6.3 g/dL (ref 6.0–8.3)

## 2010-04-18 ENCOUNTER — Ambulatory Visit (HOSPITAL_COMMUNITY): Admission: RE | Admit: 2010-04-18 | Discharge: 2010-04-18 | Payer: Self-pay | Admitting: Radiation Oncology

## 2010-05-14 ENCOUNTER — Encounter: Payer: Self-pay | Admitting: Internal Medicine

## 2010-05-22 ENCOUNTER — Encounter: Payer: Self-pay | Admitting: Internal Medicine

## 2010-07-07 ENCOUNTER — Encounter: Payer: Self-pay | Admitting: Oncology

## 2010-07-16 ENCOUNTER — Ambulatory Visit
Admission: RE | Admit: 2010-07-16 | Discharge: 2010-07-16 | Payer: Self-pay | Source: Home / Self Care | Attending: Internal Medicine | Admitting: Internal Medicine

## 2010-07-16 DIAGNOSIS — Z170 Estrogen receptor positive status [ER+]: Secondary | ICD-10-CM

## 2010-07-16 DIAGNOSIS — C50411 Malignant neoplasm of upper-outer quadrant of right female breast: Secondary | ICD-10-CM | POA: Insufficient documentation

## 2010-07-16 DIAGNOSIS — H918X9 Other specified hearing loss, unspecified ear: Secondary | ICD-10-CM | POA: Insufficient documentation

## 2010-07-16 NOTE — Letter (Signed)
[facility]   Im[facility] By: Lester [place] 10/03/2009 10:45:46  _____________________________________________________________________  External Attachment:    Type:   Image     Comment:   External Document

## 2010-07-16 NOTE — Letter (Signed)
[facility]   Imported By: Lester [place] 09/14/2009 11:47:12  _____________________________________________________________________  External Attachment:    Type:   Image     Comment:   External Document

## 2010-07-16 NOTE — Letter (Signed)
[facility]   Imported By: Sherian Rein 12/26/2009 13:16:19  _____________________________________________________________________  External Attachment:    Type:   Image     Comment:   External Document

## 2010-07-16 NOTE — Letter (Signed)
[facility]   Imported By: Sherian Rein 12/20/2009 11:19:11  _____________________________________________________________________  External Attachment:    Type:   Image     Comment:   External Document

## 2010-07-16 NOTE — Letter (Signed)
Summary: [facility]  [facility]   Imported By: Lennie Odor 03/19/2010 09:53:16  _____________________________________________________________________  External Attachment:    Type:   Image     Comment:   External Document

## 2010-07-16 NOTE — Letter (Signed)
[facility]   Imported By: Lennie Odor 01/01/2010 10:46:20  _____________________________________________________________________  External Attachment:    Type:   Image     Comment:   External Document

## 2010-07-16 NOTE — Letter (Signed)
[facility]   Imported By: Sherian Rein 01/07/2010 12:15:06  _____________________________________________________________________  External Attachment:    Type:   Image     Comment:   External Document

## 2010-07-16 NOTE — Letter (Signed)
[facility]   Imported By: Lennie Odor 08/24/2009 13:49:59  _____________________________________________________________________  External Attachment:    Type:   Image     Comment:   External Document

## 2010-07-16 NOTE — Letter (Signed)
[facility]   Imported By: Lennie Odor 12/12/2009 10:56:10  _____________________________________________________________________  External Attachment:    Type:   Image     Comment:   External Document

## 2010-07-16 NOTE — Letter (Signed)
[facility]   Imported By: Sherian Rein 11/08/2009 08:17:10  _____________________________________________________________________  External Attachment:    Type:   Image     Comment:   External Document

## 2010-07-16 NOTE — Letter (Signed)
[facility]   Imported By: Lester  09/14/2009 11:44:48  _____________________________________________________________________  External Attachment:    Type:   Image     Comment:   External Document

## 2010-07-16 NOTE — Letter (Signed)
[facility]   Imported By: Lester [place] 09/14/2009 11:45:51  _____________________________________________________________________  External Attachment:    Type:   Image     Comment:   External Document

## 2010-07-16 NOTE — Letter (Signed)
[facility]   Imported By: Sherian Rein 12/04/2009 15:08:11  _____________________________________________________________________  External Attachment:    Type:   Image     Comment:   External Document

## 2010-07-16 NOTE — Letter (Signed)
[facility]   Imported By: Sherian Rein 10/23/2009 14:15:23  _____________________________________________________________________  External Attachment:    Type:   Image     Comment:   External Document

## 2010-07-16 NOTE — Letter (Signed)
[facility]   Imported By: Lennie Odor 10/09/2009 11:01:33  _____________________________________________________________________  External Attachment:    Type:   Image     Comment:   External Document

## 2010-07-16 NOTE — Progress Notes (Signed)
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  Initial call taken by: Margaret Pyle, CMA,  July 25, 2009 3:44 PM  Follow-up for Phone Call        noted Follow-up by: Corwin Levins MD,  July 25, 2009 4:45 PM  New Problems: BREAST CANCER (ICD-174.9)   New Problems: BREAST CANCER (ICD-174.9)

## 2010-07-16 NOTE — Letter (Signed)
[facility]   Imported By: Sherian Rein 11/07/2009 14:58:28  _____________________________________________________________________  External Attachment:    Type:   Image     Comment:   External Document

## 2010-07-16 NOTE — Letter (Signed)
[facility]   Imported By: Lester [place] 09/19/2009 09:58:09  _____________________________________________________________________  External Attachment:    Type:   Image     Comment:   External Document

## 2010-07-16 NOTE — Letter (Signed)
[facility]   Imported By: Lester [place] 11/29/2009 08:16:47  _____________________________________________________________________  External Attachment:    Type:   Image     Comment:   External Document

## 2010-07-16 NOTE — Letter (Signed)
[facility]   Imported By: Lennie Odor 01/01/2010 10:50:42  _____________________________________________________________________  External Attachment:    Type:   Image     Comment:   External Document

## 2010-07-16 NOTE — Letter (Signed)
[facility]   Imported By: Sherian Rein 12/04/2009 15:07:16  _____________________________________________________________________  External Attachment:    Type:   Image     Comment:   External Document

## 2010-07-16 NOTE — Letter (Signed)
[facility]   Imported By: Lester [place] 09/27/2009 08:40:38  _____________________________________________________________________  External Attachment:    Type:   Image     Comment:   External Document

## 2010-07-16 NOTE — Letter (Signed)
Summary: [facility]  [facility]   Imported By: Lester [place] 02/28/2010 08:21:01  _____________________________________________________________________  External Attachment:    Type:   Image     Comment:   External Document

## 2010-07-16 NOTE — Letter (Signed)
[facility]   Imported By: Lester [place] 08/30/2009 08:44:58  _____________________________________________________________________  External Attachment:    Type:   Image     Comment:   External Document

## 2010-07-16 NOTE — Letter (Signed)
Summary: [facility]   Imported By: Sherian Rein 08/13/2009 14:16:28  _____________________________________________________________________  External Attachment:    Type:   Image     Comment:   External Document

## 2010-07-16 NOTE — Letter (Signed)
Summary: [facility]  [facility]   Imported By: Sherian Rein 07/13/2009 12:15:46  _____________________________________________________________________  External Attachment:    Type:   Image     Comment:   External Document

## 2010-07-18 NOTE — Letter (Signed)
Summary: [facility]   Imported By: Lester [place] 06/11/2010 09:08:00  _____________________________________________________________________  External Attachment:    Type:   Image     Comment:   External Document

## 2010-07-18 NOTE — Letter (Signed)
Summary: [facility]  [facility]   Imported By: Sherian Rein 06/05/2010 07:24:06  _____________________________________________________________________  External Attachment:    Type:   Image     Comment:   External Document

## 2010-07-22 ENCOUNTER — Other Ambulatory Visit: Payer: Self-pay | Admitting: Gynecology
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
## 2010-08-01 ENCOUNTER — Other Ambulatory Visit (HOSPITAL_COMMUNITY): Payer: Self-pay | Admitting: Radiology

## 2010-08-01 DIAGNOSIS — R922 Inconclusive mammogram: Secondary | ICD-10-CM

## 2010-08-01 DIAGNOSIS — C50919 Malignant neoplasm of unspecified site of unspecified female breast: Secondary | ICD-10-CM

## 2010-08-06 ENCOUNTER — Encounter (HOSPITAL_BASED_OUTPATIENT_CLINIC_OR_DEPARTMENT_OTHER): Payer: BC Managed Care – PPO | Admitting: Oncology

## 2010-08-06 ENCOUNTER — Other Ambulatory Visit: Payer: Self-pay | Admitting: Oncology

## 2010-08-06 DIAGNOSIS — Z170 Estrogen receptor positive status [ER+]: Secondary | ICD-10-CM

## 2010-08-06 DIAGNOSIS — C50219 Malignant neoplasm of upper-inner quadrant of unspecified female breast: Secondary | ICD-10-CM

## 2010-08-06 LAB — CBC WITH DIFFERENTIAL/PLATELET
Basophils Absolute: 0 10*3/uL (ref 0.0–0.1)
Eosinophils Absolute: 0.1 10*3/uL (ref 0.0–0.5)
HCT: 38.4 % (ref 34.8–46.6)
LYMPH%: 27.1 % (ref 14.0–49.7)
MCV: 93.3 fL (ref 79.5–101.0)
MONO#: 0.3 10*3/uL (ref 0.1–0.9)
MONO%: 8.2 % (ref 0.0–14.0)
NEUT#: 1.8 10*3/uL (ref 1.5–6.5)
NEUT%: 60.4 % (ref 38.4–76.8)
Platelets: 156 10*3/uL (ref 145–400)
WBC: 3.1 10*3/uL — ABNORMAL LOW (ref 3.9–10.3)

## 2010-08-06 LAB — COMPREHENSIVE METABOLIC PANEL
Albumin: 4.3 g/dL (ref 3.5–5.2)
BUN: 10 mg/dL (ref 6–23)
CO2: 31 mEq/L (ref 19–32)
Glucose, Bld: 99 mg/dL (ref 70–99)
Sodium: 141 mEq/L (ref 135–145)
Total Bilirubin: 0.4 mg/dL (ref 0.3–1.2)
Total Protein: 6.8 g/dL (ref 6.0–8.3)

## 2010-08-13 ENCOUNTER — Encounter: Payer: Self-pay | Admitting: Internal Medicine

## 2010-08-13 ENCOUNTER — Encounter (HOSPITAL_BASED_OUTPATIENT_CLINIC_OR_DEPARTMENT_OTHER): Payer: BC Managed Care – PPO | Admitting: Oncology

## 2010-08-13 DIAGNOSIS — Z170 Estrogen receptor positive status [ER+]: Secondary | ICD-10-CM

## 2010-08-13 DIAGNOSIS — C50219 Malignant neoplasm of upper-inner quadrant of unspecified female breast: Secondary | ICD-10-CM

## 2010-08-15 ENCOUNTER — Ambulatory Visit (HOSPITAL_COMMUNITY)
Admission: RE | Admit: 2010-08-15 | Discharge: 2010-08-15 | Disposition: A | Discharge status: Home / Self Care | Payer: BC Managed Care – PPO | Source: Ambulatory Visit | Attending: Radiology | Admitting: Radiology

## 2010-08-15 ENCOUNTER — Other Ambulatory Visit (HOSPITAL_COMMUNITY): Payer: Self-pay

## 2010-08-15 DIAGNOSIS — C50919 Malignant neoplasm of unspecified site of unspecified female breast: Secondary | ICD-10-CM

## 2010-08-15 DIAGNOSIS — R922 Inconclusive mammogram: Secondary | ICD-10-CM

## 2010-08-15 DIAGNOSIS — Z9010 Acquired absence of unspecified breast and nipple: Secondary | ICD-10-CM | POA: Insufficient documentation

## 2010-08-15 DIAGNOSIS — Z9221 Personal history of antineoplastic chemotherapy: Secondary | ICD-10-CM | POA: Insufficient documentation

## 2010-08-15 DIAGNOSIS — Z853 Personal history of malignant neoplasm of breast: Secondary | ICD-10-CM | POA: Insufficient documentation

## 2010-08-27 NOTE — Letter (Signed)
[facility]  [facility]   Imported By: Lennie Odor 08/21/2010 10:39:28  _____________________________________________________________________  External Attachment:    Type:   Image     Comment:   External Document

## 2010-08-30 LAB — COMPREHENSIVE METABOLIC PANEL
ALT: 25 U/L (ref 0–35)
AST: 30 U/L (ref 0–37)
Alkaline Phosphatase: 73 U/L (ref 39–117)
CO2: 33 mEq/L — ABNORMAL HIGH (ref 19–32)
Calcium: 9.2 mg/dL (ref 8.4–10.5)
Chloride: 106 mEq/L (ref 96–112)
GFR calc Af Amer: >60 mL/min (ref >60)
GFR calc non Af Amer: >60 mL/min (ref >60)
Glucose, Bld: 95 mg/dL (ref 70–99)
Potassium: 3.6 mEq/L (ref 3.5–5.1)
Sodium: 143 mEq/L (ref 135–145)

## 2010-08-30 LAB — DIFFERENTIAL
Basophils Absolute: 0 10*3/uL (ref 0.0–0.1)
Basophils Relative: 1 % (ref 0–1)
Eosinophils Absolute: 0.2 10*3/uL (ref 0.0–0.7)
Eosinophils Relative: 4 % (ref 0–5)
Lymphocytes Relative: 24 % (ref 12–46)
Lymphs Abs: 1.1 10*3/uL (ref 0.7–4.0)
Monocytes Absolute: 0.6 10*3/uL (ref 0.1–1.0)
Monocytes Relative: 12 % (ref 3–12)
Neutro Abs: 2.6 10*3/uL (ref 1.7–7.7)
Neutrophils Relative %: 58 % (ref 43–77)

## 2010-08-30 LAB — CBC
HCT: 37.6 % (ref 36.0–46.0)
Hemoglobin: 12.6 g/dL (ref 12.0–15.0)
MCHC: 33.5 g/dL (ref 30.0–36.0)
RBC: 4.09 MIL/uL (ref 3.87–5.11)
WBC: 4.5 10*3/uL (ref 4.0–10.5)

## 2010-08-30 LAB — PROTIME-INR: Prothrombin Time: 13.4 seconds (ref 11.6–15.2)

## 2010-08-30 LAB — SURGICAL PCR SCREEN: MRSA, PCR: NEGATIVE

## 2010-09-01 LAB — CBC
HCT: 25.9 % — ABNORMAL LOW (ref 36.0–46.0)
HCT: 26.9 % — ABNORMAL LOW (ref 36.0–46.0)
Hemoglobin: 9.6 g/dL — ABNORMAL LOW (ref 12.0–15.0)
MCH: 31.8 pg (ref 26.0–34.0)
MCV: 89.3 fL (ref 78.0–100.0)
Platelets: 37 10*3/uL — ABNORMAL LOW (ref 150–400)
RBC: 3.01 MIL/uL — ABNORMAL LOW (ref 3.87–5.11)
RDW: 15.6 % — ABNORMAL HIGH (ref 11.5–15.5)
WBC: 4 10*3/uL (ref 4.0–10.5)

## 2010-09-01 LAB — DIFFERENTIAL
Basophils Absolute: 0 10*3/uL (ref 0.0–0.1)
Basophils Relative: 2 % — ABNORMAL HIGH (ref 0–1)
Eosinophils Relative: 0 % (ref 0–5)
Lymphocytes Relative: 10 % — ABNORMAL LOW (ref 12–46)
Lymphs Abs: 0.4 10*3/uL — ABNORMAL LOW (ref 0.7–4.0)
Monocytes Relative: 6 % (ref 3–12)
Monocytes Relative: 9 % (ref 3–12)
Neutro Abs: 3.2 10*3/uL (ref 1.7–7.7)
Neutro Abs: 3.9 10*3/uL (ref 1.7–7.7)

## 2010-09-01 LAB — DIC (DISSEMINATED INTRAVASCULAR COAGULATION)PANEL
D-Dimer, Quant: 0.35 ug/mL-FEU (ref 0.00–0.48)
INR: 1.11 (ref 0.00–1.49)
Prothrombin Time: 14.2 seconds (ref 11.6–15.2)
aPTT: 31 seconds (ref 24–37)

## 2010-09-01 LAB — ABO/RH: ABO/RH(D): O POS

## 2010-09-01 LAB — BASIC METABOLIC PANEL
Chloride: 107 mEq/L (ref 96–112)
Creatinine, Ser: 0.55 mg/dL (ref 0.4–1.2)
GFR calc Af Amer: >60 mL/min (ref >60)
Sodium: 140 mEq/L (ref 135–145)

## 2010-09-01 LAB — PREPARE PLATELETS

## 2010-09-01 LAB — HEMOCCULT GUIAC POC 1CARD (OFFICE): Fecal Occult Bld: NEGATIVE

## 2010-09-01 LAB — PLT GLYCOPROTEIN (INDIRECT) AUTOABS: Plt GP Ia/IIa Autoabs: NOT DETECTED

## 2010-09-01 LAB — PLATELET COUNT: Platelets: 49 10*3/uL — ABNORMAL LOW (ref 150–400)

## 2010-09-01 LAB — CROSSMATCH: ABO/RH(D): O POS

## 2010-09-01 LAB — PLATELET ANTIBODIES, DIRECT: Platelet IgG Ab, Direct: NEGATIVE

## 2010-09-02 LAB — BLOOD GAS, ARTERIAL
Acid-Base Excess: 0.3 mmol/L (ref 0.0–2.0)
Drawn by: 324021
Expiratory PAP: 5
Inspiratory PAP: 14
Mode: POSITIVE
pCO2 arterial: 34.4 mmHg — ABNORMAL LOW (ref 35.0–45.0)
pH, Arterial: 7.448 — ABNORMAL HIGH (ref 7.350–7.400)
pO2, Arterial: 190 mmHg — ABNORMAL HIGH (ref 80.0–100.0)

## 2010-09-02 LAB — COMPREHENSIVE METABOLIC PANEL
AST: 36 U/L (ref 0–37)
Albumin: 3.7 g/dL (ref 3.5–5.2)
BUN: 8 mg/dL (ref 6–23)
Creatinine, Ser: 0.71 mg/dL (ref 0.4–1.2)
GFR calc Af Amer: >60 mL/min (ref >60)
Potassium: 4.3 mEq/L (ref 3.5–5.1)
Total Protein: 6 g/dL (ref 6.0–8.3)

## 2010-09-02 LAB — BASIC METABOLIC PANEL
CO2: 25 mEq/L (ref 19–32)
CO2: 26 mEq/L (ref 19–32)
Calcium: 8.7 mg/dL (ref 8.4–10.5)
Calcium: 8.8 mg/dL (ref 8.4–10.5)
Chloride: 111 mEq/L (ref 96–112)
Chloride: 111 mEq/L (ref 96–112)
Creatinine, Ser: 0.6 mg/dL (ref 0.4–1.2)
GFR calc Af Amer: >60 mL/min (ref >60)
GFR calc Af Amer: >60 mL/min (ref >60)
GFR calc non Af Amer: >60 mL/min (ref >60)
GFR calc non Af Amer: >60 mL/min (ref >60)
GFR calc non Af Amer: >60 mL/min (ref >60)
Glucose, Bld: 79 mg/dL (ref 70–99)
Glucose, Bld: 90 mg/dL (ref 70–99)
Potassium: 3.6 mEq/L (ref 3.5–5.1)
Potassium: 4.2 mEq/L (ref 3.5–5.1)
Sodium: 141 mEq/L (ref 135–145)
Sodium: 142 mEq/L (ref 135–145)
Sodium: 145 mEq/L (ref 135–145)

## 2010-09-02 LAB — CBC
HCT: 32.4 % — ABNORMAL LOW (ref 36.0–46.0)
HCT: 36.3 % (ref 36.0–46.0)
Hemoglobin: 11.8 g/dL — ABNORMAL LOW (ref 12.0–15.0)
Hemoglobin: 9.3 g/dL — ABNORMAL LOW (ref 12.0–15.0)
Hemoglobin: 9.8 g/dL — ABNORMAL LOW (ref 12.0–15.0)
MCHC: 32.5 g/dL (ref 30.0–36.0)
MCHC: 32.7 g/dL (ref 30.0–36.0)
MCV: 93.3 fL (ref 78.0–100.0)
MCV: 93.7 fL (ref 78.0–100.0)
Platelets: 234 10*3/uL (ref 150–400)
Platelets: 238 10*3/uL (ref 150–400)
RBC: 3.8 MIL/uL — ABNORMAL LOW (ref 3.87–5.11)
RDW: 16.2 % — ABNORMAL HIGH (ref 11.5–15.5)
RDW: 16.4 % — ABNORMAL HIGH (ref 11.5–15.5)
RDW: 16.5 % — ABNORMAL HIGH (ref 11.5–15.5)
RDW: 16.7 % — ABNORMAL HIGH (ref 11.5–15.5)
WBC: 6.6 10*3/uL (ref 4.0–10.5)

## 2010-09-02 LAB — DIFFERENTIAL
Basophils Absolute: 0.1 10*3/uL (ref 0.0–0.1)
Eosinophils Relative: 0 % (ref 0–5)
Eosinophils Relative: 2 % (ref 0–5)
Lymphocytes Relative: 1 % — ABNORMAL LOW (ref 12–46)
Lymphocytes Relative: 17 % (ref 12–46)
Lymphs Abs: 1.1 10*3/uL (ref 0.7–4.0)
Monocytes Absolute: 0 10*3/uL — ABNORMAL LOW (ref 0.1–1.0)
Monocytes Absolute: 0.3 10*3/uL (ref 0.1–1.0)
Monocytes Relative: 0 % — ABNORMAL LOW (ref 3–12)
Monocytes Relative: 4 % (ref 3–12)
Neutro Abs: 11.9 10*3/uL — ABNORMAL HIGH (ref 1.7–7.7)
Neutro Abs: 5 10*3/uL (ref 1.7–7.7)

## 2010-09-02 LAB — MRSA PCR SCREENING: MRSA by PCR: NEGATIVE

## 2010-09-04 ENCOUNTER — Telehealth: Payer: Self-pay

## 2010-09-04 NOTE — Telephone Encounter (Signed)
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

## 2010-09-04 NOTE — Telephone Encounter (Signed)
Phone call from patient

## 2010-09-05 LAB — BASIC METABOLIC PANEL
Calcium: 10 mg/dL (ref 8.4–10.5)
GFR calc Af Amer: >60 mL/min (ref >60)
GFR calc non Af Amer: >60 mL/min (ref >60)
Sodium: 145 mEq/L (ref 135–145)

## 2010-09-05 LAB — DIFFERENTIAL
Lymphocytes Relative: 26 % (ref 12–46)
Monocytes Absolute: 0.2 10*3/uL (ref 0.1–1.0)
Monocytes Relative: 7 % (ref 3–12)
Neutro Abs: 2.1 10*3/uL (ref 1.7–7.7)

## 2010-09-05 LAB — CBC
Hemoglobin: 13.8 g/dL (ref 12.0–15.0)
RBC: 4.47 MIL/uL (ref 3.87–5.11)
WBC: 3.3 10*3/uL — ABNORMAL LOW (ref 4.0–10.5)

## 2010-09-05 LAB — APTT: aPTT: 34 seconds (ref 24–37)

## 2010-09-18 ENCOUNTER — Encounter: Payer: Self-pay | Admitting: Internal Medicine

## 2010-09-18 DIAGNOSIS — M8580 Other specified disorders of bone density and structure, unspecified site: Secondary | ICD-10-CM

## 2010-09-18 HISTORY — DX: Other specified disorders of bone density and structure, unspecified site: M85.80

## 2010-10-23 ENCOUNTER — Other Ambulatory Visit: Payer: Self-pay | Admitting: Oncology

## 2010-10-23 ENCOUNTER — Encounter (HOSPITAL_BASED_OUTPATIENT_CLINIC_OR_DEPARTMENT_OTHER): Payer: BC Managed Care – PPO | Admitting: Oncology

## 2010-10-23 DIAGNOSIS — C50219 Malignant neoplasm of upper-inner quadrant of unspecified female breast: Secondary | ICD-10-CM

## 2010-10-23 DIAGNOSIS — Z170 Estrogen receptor positive status [ER+]: Secondary | ICD-10-CM

## 2010-10-23 LAB — CANCER ANTIGEN 27.29: CA 27.29: 17 U/mL (ref 0–39)

## 2010-10-23 LAB — CBC WITH DIFFERENTIAL/PLATELET
BASO%: 0.6 % (ref 0.0–2.0)
Basophils Absolute: 0 10*3/uL (ref 0.0–0.1)
HCT: 36.7 % (ref 34.8–46.6)
HGB: 12.6 g/dL (ref 11.6–15.9)
MCHC: 34.4 g/dL (ref 31.5–36.0)
MONO#: 0.3 10*3/uL (ref 0.1–0.9)
NEUT%: 55.7 % (ref 38.4–76.8)
RDW: 12.7 % (ref 11.2–14.5)
WBC: 3.5 10*3/uL — ABNORMAL LOW (ref 3.9–10.3)
lymph#: 1.1 10*3/uL (ref 0.9–3.3)

## 2010-10-23 LAB — COMPREHENSIVE METABOLIC PANEL
ALT: 16 U/L (ref 0–35)
Albumin: 4.3 g/dL (ref 3.5–5.2)
CO2: 32 mEq/L (ref 19–32)
Calcium: 9.3 mg/dL (ref 8.4–10.5)
Chloride: 102 mEq/L (ref 96–112)
Creatinine, Ser: 0.82 mg/dL (ref 0.40–1.20)
Potassium: 3.5 mEq/L (ref 3.5–5.3)

## 2010-10-28 IMAGING — PT NM PET TUM IMG INITIAL (PI) SKULL BASE T - THIGH
1 of 6 series · 1 of 25 positions shown · non-contrast
Comparison: CT scans dated 07/25/2009

CLINICAL DATA: Newly diagnosed breast cancer

NUCLEAR MEDICINE PET/CT
TECHNIQUE: 16.0 mCi F-18 FDG was injected intravenously.  Full-
ring PET imaging was performed from the skull base through the mid-
thighs.  CT data was obtained and used for attenuation correction
and anatomic localization only.  (This was not acquired as a
diagnostic CT examination.)  Data regarding site of injection,
patient weight, post injection waiting period, and fasting blood
sugar is available in the PACS documentation.

[Series 2: ct images · axial · 3.8mm · 0.98mm/px · 1 of 223 slices shown]
[im 223/223  brain]
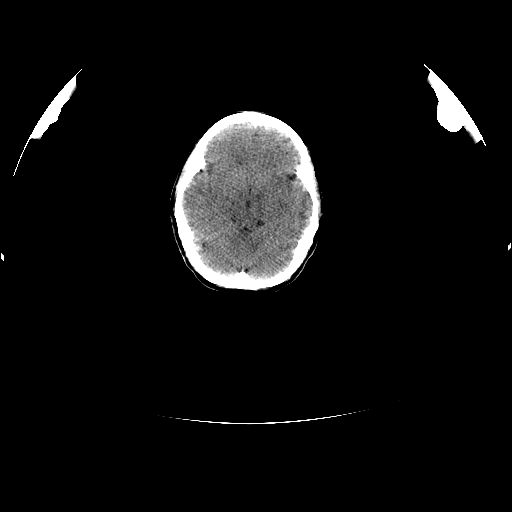

[1 of 25 positions shown; findings below may reference images not displayed]

FINDINGS: Posterior paraspinal activity along the upper cervical
spine is symmetric and thought to be incidental.  Moreover, there
is considerable activity in the supraclavicular adipose tissue
attributed to brown fat.  The patient also appears to have brown
fat in the axillary regions, with hypermetabolic activity
corresponding only to adipose tissue bilaterally.  Paraspinal
activity at the T2 level is bilaterally symmetric in the vicinity
of the transverse processes, and also probably due to adjacent
brown fat.

A right axillary structure resembling a lymph node measures the
x 2.5 cm but is not hypermetabolic.  This could represent a
hematoma if the patient has had recent axillary biopsy.  Anterior
to this structure, a smaller 1.3 by 1.0 cm structure is mildly
hypermetabolic, with a maximum standard uptake value of 4.0,
suspicious for malignant lymph node.  There is also a mildly
hypermetabolic left subpectoral lymph node adjacent to the
subclavian vasculature.  This only has a maximum standard uptake
value of 2.6 and is thus indeterminate, but the node itself appears
abnormally large

Bilateral breast implants noted.  In the upper portion of the right
breast, a focus of abnormal hypermetabolic activity has a maximum
standard uptake value of 3.9, likely representing the primary
breast cancer.

Postoperative findings in the lower lumbar spine noted. Mild
biapical scarring is present.
IMPRESSION: 1.  Right breast activity suspicious for malignancy.
2.  Right subpectoral and right axillary lymph nodes are mildly
hypermetabolic, probably representing metastatic disease.  There is
also a higher density structure in the right axilla which is not
hypermetabolic and may represent localized hemorrhage.
3.  Patchy paravertebral, axillary, supraclavicular, and lower neck
activity is compatible with benign brown fat.

## 2010-10-30 ENCOUNTER — Encounter (HOSPITAL_BASED_OUTPATIENT_CLINIC_OR_DEPARTMENT_OTHER): Payer: BC Managed Care – PPO | Admitting: Oncology

## 2010-10-30 DIAGNOSIS — Z170 Estrogen receptor positive status [ER+]: Secondary | ICD-10-CM

## 2010-10-30 DIAGNOSIS — C50219 Malignant neoplasm of upper-inner quadrant of unspecified female breast: Secondary | ICD-10-CM

## 2010-11-01 NOTE — H&P (Signed)
[place]. [facility]  Patient:    Jaime Young, Jaime Young                       MRN: [id number] Adm. Date:  12/22/00 Attending:  Payton Doughty, M.D.                         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  HISTORY OF PRESENT ILLNESS:  This is a 60 year old right-handed white female who has had low back pain.  An MRI in the past showed bulging disk.  Told at that time she probably would need an operation sometime in the future.  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  She has been having increased difficulties, complaints down her legs when driving when seated, and bladder has not been affected.  I visited with her in December 2000, tried epidural steroids which helped for a while.  However, she has had continued deterioration in her back pain.  MRI shows profound changes at both L4-5 and L5-S1, and she is now admitted for a lumbar laminectomy, diskectomy, and posterior lumbar body fusion.  PAST MEDICAL HISTORY:  Hormone replacement.  Estradiol 1 mg q.d. and vitamins.  CURRENT MEDICATIONS: 1. Vioxx p.r.n. 2. Tylenol p.r.n. 3. Oxycodone p.r.n.  ALLERGIES:  PENICILLIN, SULFA, CODEINE.  PAST SURGICAL HISTORY: 1. Hysterectomy in 1983. 2. Hemorrhoidectomy in 1983.  SOCIAL HISTORY:  She does not smoke or drink.  She is an Scientist, water quality for [employer].  FAMILY HISTORY:  Mom died at 76, daddy died at 59.  REVIEW OF SYSTEMS:  Remarkable for back pain, wearing glasses.  PHYSICAL EXAMINATION:  HEENT:  Within normal limits.  NECK:  She has reasonable range of motion of the neck.  CHEST:  Clear.  CARDIAC:  Regular rate and rhythm, no murmur.  ABDOMEN:  Nontender, no hepatosplenomegaly.  EXTREMITIES:  Without clubbing or cyanosis.  GASTROURINARY:  Deferred.  NEUROLOGIC:  Peripheral pulses are good.  She is awake, alert  and oriented. Cranial nerves II-XII grossly intact.  Motor exam shows 5/5 strength throughout the upper and lower extremities.  On strength testing of the upper extremities, pulling causes a great deal more discomfort than pushing.  Lower extremity strength is full, with sensory deficits in S1 distribution.  Deep tendon reflexes are 2 at the knees, 1 at the ankles.  Toes are downgoing bilaterally.  Straight leg raise is mildly positive on the left.  Straight leg raising on the right is positive for right leg pain.  LABORATORY DATA:  Her MRI shows profound changes at both L4-5 and L5-S1  in terms of facet arthropathy and degenerative change.  IMPRESSION:  Spondylosis with intractable back pain.  PLAN:  A lumbar laminectomy, diskectomy, posterior lumbar interbody fusion with a ______ cage.  The risks and benefits of this approach have been discussed with her, and she wishes to proceed.DD:  12/22/00 TD:  12/22/00 Job: [id number]

## 2010-11-01 NOTE — Discharge Summary (Signed)
[place]  Patient:    ALINE, WESCHE Visit Number: [id number] MRN: [id number]          Service Type: SUR Location: [id number] Attending Physician:  Emeterio Reeve Admit Date:  12/22/2000 Discharge Date: 12/26/2000                             Discharge Summary  For full details of this admission, please refer to Dr. Rolan Bucco typed history and physical.  HOSPITAL COURSE:  Dr. Channing Mutters admitted the patient to [facility] on December 22, 2000, with the diagnosis of L4-5 and L5-S1 spondylosis.  On the day of admission, he performed L4-5, L5-S1 laminectomy, diskectomy, posterior lumbar interbody fusion with insertion Ray threaded fusion cages.  The surgery went well without complications (for full details of this operation, please refer to the typed operative note).  POSTOPERATIVE COURSE:  The patients postoperative course was essentially unremarkable and by December 26, 2000, the patient was afebrile, her vital signs were stable, she was eating well, ambulating well, and her wound was healing well without signs of infection.  She was requested discharge to home.  She was therefore discharged home on December 26, 2000.  DISCHARGE INSTRUCTIONS:  The patient was given written discharge instructions and instructed to follow up with Dr. Channing Mutters for suture removal.  DISCHARGE MEDICATIONS:  The patient was discharged to resume her outpatient medical regimen and given a prescription for Darvocet-N 100, #60, to use p.r.n. for pain.  DIAGNOSIS:  L4-5 and L5-S1 spondylosis.  PROCEDURES:  L4-5 and L5-S1 laminectomy, diskectomy, and posterior lumbar interbody fusion with Ray threaded fusion cages. Attending Physician:  Emeterio Reeve DD:  02/11/01 TD:  02/12/01 Job: [id number]

## 2010-11-01 NOTE — Op Note (Signed)
Jaime Young, Jaime Young              ACCOUNT NO.:  [id number]   MEDICAL RECORD NO.:  [id number]          PATIENT TYPE:  OIB   LOCATION:  5705                         FACILITY:  [facility]   PHYSICIAN:  John D. Ashley Royalty, M.D. 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   PROCEDURES:  Pars plana vitrectomy, retinal photocoagulation, membrane peel,  intravitreal Kenalog injection, left eye.   SURGEON:  Alan Mulder, MD   ASSISTANT:  Bryan Lemma. Lundquist, PA   ANESTHESIA:  General.   DETAILS:  Usual prep and drape, peritomies at 10, 2 and 4 o'clock, 5-mm  infusion port anchored into place at 4 o'clock, the contact lens ring  anchored into place at 6 and 12 o'clock, Provisc placed on the corneal  surface and the flat contact lens was placed.  Pars plana vitrectomy was  begun just behind the crystalline lens.  Vitreous strands were encountered  and carefully removed under low suction and rapid cutting.  The vitrectomy  was carried down to the macular surface, where a white glistening membrane  was encountered.  The 27-gauge pick and the 20-gauge MVR blade were used to  engage the membrane and lift it free.  The silicone-tipped suction device  and the lighted pick were used to engage the membrane and extend its  elevation out to the equator for 360 degrees.  Once the membrane was  elevated, it was removed with a vitreous cutter.  The contact lens ring was  removed and the biome viewing system was moved into place.  The vitrectomy  was carried out into the far periphery, where all membranes were removed.  The indirect ophthalmoscope laser was moved into place and 936 burns placed  around the retinal periphery with a power between 600 and 800 milliwatts,  1000 microns each and 0.1 seconds each.  Intravitreal Kenalog, 0.1 mL, was  then  injected into the vitreous cavity.  The instruments were removed from  the eye and 9-0 nylon was used to close the sclerotomy sites; they were  tested and found to be tight.  The conjunctiva was closed with wet-field  cautery.  Polymyxin and gentamicin were irrigated into Tenon's space.  Atropine solution was applied.  Decadron 10 mg was injected to the lower  subconjunctival space.  Marcaine was injected around the globe for postop  pain.  The closing tension was 15 with a Barraquer keratometer.  TobraDex  ophthalmic ointment, a patch and shield were placed.  The patient was  awakened and taken to Recovery in satisfactory condition.   COMPLICATIONS:  None.   DURATION:  One hour.     Beulah Gandy. Ashley Royalty, M.D.  Electronically Signed    JDM/MEDQ  D:  05/01/2005  T:  05/02/2005  Job:  [id number]

## 2010-11-17 ENCOUNTER — Encounter: Payer: Self-pay | Admitting: Internal Medicine

## 2010-11-17 DIAGNOSIS — R7302 Impaired glucose tolerance (oral): Secondary | ICD-10-CM

## 2010-11-17 DIAGNOSIS — Z0000 Encounter for general adult medical examination without abnormal findings: Secondary | ICD-10-CM | POA: Insufficient documentation

## 2010-11-17 HISTORY — DX: Impaired glucose tolerance (oral): R73.02

## 2010-11-18 ENCOUNTER — Other Ambulatory Visit: Payer: Self-pay | Admitting: Internal Medicine

## 2010-11-18 ENCOUNTER — Other Ambulatory Visit (INDEPENDENT_AMBULATORY_CARE_PROVIDER_SITE_OTHER): Payer: BC Managed Care – PPO

## 2010-11-18 DIAGNOSIS — Z0000 Encounter for general adult medical examination without abnormal findings: Secondary | ICD-10-CM

## 2010-11-18 LAB — CBC WITH DIFFERENTIAL/PLATELET
Basophils Absolute: 0 10*3/uL (ref 0.0–0.1)
Eosinophils Absolute: 0.1 10*3/uL (ref 0.0–0.7)
HCT: 39 % (ref 36.0–46.0)
Hemoglobin: 13.3 g/dL (ref 12.0–15.0)
Lymphs Abs: 0.6 10*3/uL — ABNORMAL LOW (ref 0.7–4.0)
MCHC: 34.2 g/dL (ref 30.0–36.0)
MCV: 96.7 fl (ref 78.0–100.0)
Monocytes Absolute: 0.2 10*3/uL (ref 0.1–1.0)
Monocytes Relative: 6 % (ref 3.0–12.0)
Neutro Abs: 3 10*3/uL (ref 1.4–7.7)
RDW: 13.1 % (ref 11.5–14.6)

## 2010-11-18 LAB — HEPATIC FUNCTION PANEL
ALT: 21 U/L (ref 0–35)
AST: 27 U/L (ref 0–37)
Alkaline Phosphatase: 47 U/L (ref 39–117)
Bilirubin, Direct: 0.1 mg/dL (ref 0.0–0.3)
Total Bilirubin: 0.5 mg/dL (ref 0.3–1.2)

## 2010-11-18 LAB — URINALYSIS
Nitrite: NEGATIVE
Total Protein, Urine: NEGATIVE
Urine Glucose: NEGATIVE
pH: 7.5 (ref 5.0–8.0)

## 2010-11-18 LAB — BASIC METABOLIC PANEL
BUN: 11 mg/dL (ref 6–23)
Calcium: 8.8 mg/dL (ref 8.4–10.5)
GFR: 84.82 mL/min (ref >60.00)
Glucose, Bld: 85 mg/dL (ref 70–99)
Potassium: 4 mEq/L (ref 3.5–5.1)
Sodium: 143 mEq/L (ref 135–145)

## 2010-11-18 LAB — LIPID PANEL: Cholesterol: 185 mg/dL (ref 0–200)

## 2010-11-19 ENCOUNTER — Encounter (INDEPENDENT_AMBULATORY_CARE_PROVIDER_SITE_OTHER): Payer: Self-pay | Admitting: General Surgery

## 2010-11-19 ENCOUNTER — Encounter: Payer: Self-pay | Admitting: Internal Medicine

## 2010-11-22 ENCOUNTER — Encounter: Payer: Self-pay | Admitting: Internal Medicine

## 2010-11-22 ENCOUNTER — Ambulatory Visit (INDEPENDENT_AMBULATORY_CARE_PROVIDER_SITE_OTHER): Payer: BC Managed Care – PPO | Admitting: Internal Medicine

## 2010-11-22 VITALS — BP 112/70 | HR 65 | Temp 98.1°F | Ht 62.0 in | Wt 128.2 lb

## 2010-11-22 DIAGNOSIS — R609 Edema, unspecified: Secondary | ICD-10-CM

## 2010-11-22 DIAGNOSIS — Z0000 Encounter for general adult medical examination without abnormal findings: Secondary | ICD-10-CM

## 2010-11-22 DIAGNOSIS — R7309 Other abnormal glucose: Secondary | ICD-10-CM

## 2010-11-22 DIAGNOSIS — R600 Localized edema: Secondary | ICD-10-CM | POA: Insufficient documentation

## 2010-11-22 DIAGNOSIS — R7302 Impaired glucose tolerance (oral): Secondary | ICD-10-CM

## 2010-11-22 MED ORDER — FUROSEMIDE 20 MG PO TABS: 20.0000 mg | ORAL_TABLET | Freq: Every day | ORAL | Status: DC

## 2010-11-22 NOTE — Assessment & Plan Note (Signed)
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
## 2010-11-22 NOTE — Patient Instructions (Signed)
Take all new medications as prescribed Continue all other medications as before You will be contacted regarding the referral for: echocardiogram Please keep your appointments with your specialists as you have planned - Dr Magrinat/oncology Please return in 1 year for your yearly visit, or sooner if needed, with Lab testing done 3-5 days before

## 2010-11-22 NOTE — Assessment & Plan Note (Signed)
Likely some element of venous insuff, but  Cant r/o other such as diast dysfunction or other cardiac s/p chemo - for echo to further eval, and lasix 20 mg qd

## 2010-11-28 ENCOUNTER — Encounter: Payer: Self-pay | Admitting: *Deleted

## 2010-12-03 ENCOUNTER — Ambulatory Visit (HOSPITAL_COMMUNITY): Payer: BC Managed Care – PPO | Attending: Internal Medicine

## 2010-12-03 DIAGNOSIS — E785 Hyperlipidemia, unspecified: Secondary | ICD-10-CM | POA: Insufficient documentation

## 2010-12-03 DIAGNOSIS — I059 Rheumatic mitral valve disease, unspecified: Secondary | ICD-10-CM | POA: Insufficient documentation

## 2010-12-03 DIAGNOSIS — I079 Rheumatic tricuspid valve disease, unspecified: Secondary | ICD-10-CM | POA: Insufficient documentation

## 2010-12-03 DIAGNOSIS — R609 Edema, unspecified: Secondary | ICD-10-CM

## 2011-01-01 ENCOUNTER — Encounter (INDEPENDENT_AMBULATORY_CARE_PROVIDER_SITE_OTHER): Payer: Self-pay | Admitting: General Surgery

## 2011-01-23 ENCOUNTER — Other Ambulatory Visit: Payer: Self-pay | Admitting: Oncology

## 2011-01-23 ENCOUNTER — Encounter: Payer: BC Managed Care – PPO | Admitting: Oncology

## 2011-01-23 DIAGNOSIS — Z170 Estrogen receptor positive status [ER+]: Secondary | ICD-10-CM

## 2011-01-23 DIAGNOSIS — C50219 Malignant neoplasm of upper-inner quadrant of unspecified female breast: Secondary | ICD-10-CM

## 2011-01-23 LAB — COMPREHENSIVE METABOLIC PANEL
Albumin: 4.2 g/dL (ref 3.5–5.2)
BUN: 17 mg/dL (ref 6–23)
CO2: 29 mEq/L (ref 19–32)
Calcium: 9.1 mg/dL (ref 8.4–10.5)
Chloride: 104 mEq/L (ref 96–112)
Glucose, Bld: 89 mg/dL (ref 70–99)
Potassium: 3.8 mEq/L (ref 3.5–5.3)
Sodium: 140 mEq/L (ref 135–145)
Total Protein: 6.7 g/dL (ref 6.0–8.3)

## 2011-01-23 LAB — CBC WITH DIFFERENTIAL/PLATELET
Basophils Absolute: 0 10*3/uL (ref 0.0–0.1)
Eosinophils Absolute: 0.1 10*3/uL (ref 0.0–0.5)
HGB: 13.4 g/dL (ref 11.6–15.9)
MCV: 94.5 fL (ref 79.5–101.0)
MONO#: 0.3 10*3/uL (ref 0.1–0.9)
NEUT#: 2.3 10*3/uL (ref 1.5–6.5)
RBC: 4.11 10*6/uL (ref 3.70–5.45)
RDW: 12.9 % (ref 11.2–14.5)
WBC: 3.7 10*3/uL — ABNORMAL LOW (ref 3.9–10.3)
lymph#: 1 10*3/uL (ref 0.9–3.3)

## 2011-01-30 ENCOUNTER — Encounter (HOSPITAL_BASED_OUTPATIENT_CLINIC_OR_DEPARTMENT_OTHER): Payer: BC Managed Care – PPO | Admitting: Oncology

## 2011-01-30 DIAGNOSIS — C50219 Malignant neoplasm of upper-inner quadrant of unspecified female breast: Secondary | ICD-10-CM

## 2011-01-30 DIAGNOSIS — Z170 Estrogen receptor positive status [ER+]: Secondary | ICD-10-CM

## 2011-01-31 ENCOUNTER — Other Ambulatory Visit: Payer: Self-pay | Admitting: Oncology

## 2011-01-31 DIAGNOSIS — C50919 Malignant neoplasm of unspecified site of unspecified female breast: Secondary | ICD-10-CM

## 2011-02-03 ENCOUNTER — Encounter (INDEPENDENT_AMBULATORY_CARE_PROVIDER_SITE_OTHER): Payer: Self-pay | Admitting: General Surgery

## 2011-02-03 ENCOUNTER — Ambulatory Visit (INDEPENDENT_AMBULATORY_CARE_PROVIDER_SITE_OTHER): Payer: BC Managed Care – PPO | Admitting: General Surgery

## 2011-02-03 DIAGNOSIS — Z853 Personal history of malignant neoplasm of breast: Secondary | ICD-10-CM

## 2011-02-03 NOTE — Progress Notes (Signed)
Subjective:     Patient ID: Jaime Young, female   DOB: 02/18/1951, 60 y.o.   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  She reports no complaints since our last visit.  She reports some soreness in her right breast at the operative site.  She is currently on tamoxifen and tolerating that well.  She has a negative mammogram in February and a normal MRI in March.    Review of Systems     Objective:   Physical Exam  Constitutional: She appears well-developed and well-nourished.  Neck: Neck supple. No thyromegaly present.  Pulmonary/Chest: Right breast exhibits no inverted nipple, no mass, no nipple discharge, no skin change and no tenderness. Left breast exhibits no inverted nipple, no mass, no nipple discharge, no skin change and no tenderness. Breasts are symmetrical.         Well healed right axillary incision, no axillary adenopathy bilaterally Bilateral implants  Lymphadenopathy:    She has no cervical adenopathy.       Assessment:    History of right breast cancer s/p lumpectomy and alnd    Plan:    She is doing well on her antiestrogen therapy.  She has normal exam and radiologic studies.  She is also following up with Dr. Darnelle Catalan and I will have her see me in a year for an exam.  I think the soreness she has in the right breast is normal postoperative.  I asked her to call if she has any concerns before her next visit.

## 2011-02-08 IMAGING — CR DG NECK SOFT TISSUE
2 series · 2 of 2 positions shown · non-contrast
Comparison: None.

CLINICAL DATA: Cough, shortness of breath, history of breast cancer

NECK SOFT TISSUES - 1+ VIEW

[w soft tissue neck * (1 of 2)]
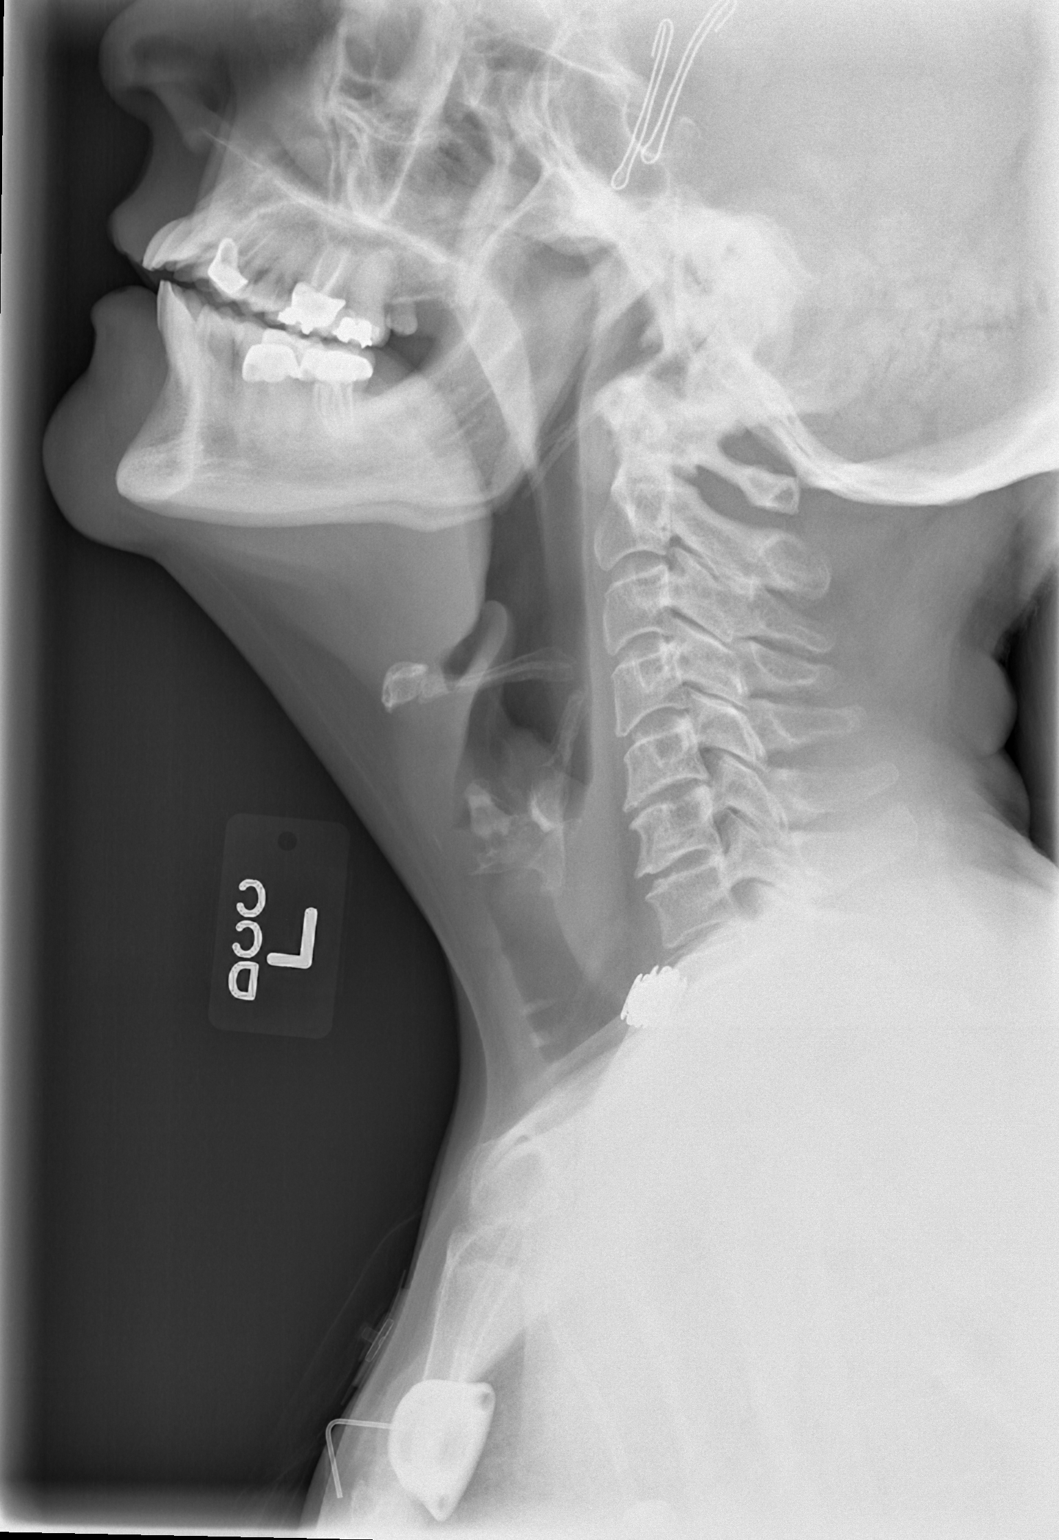

[w soft tissue neck * (2 of 2)]
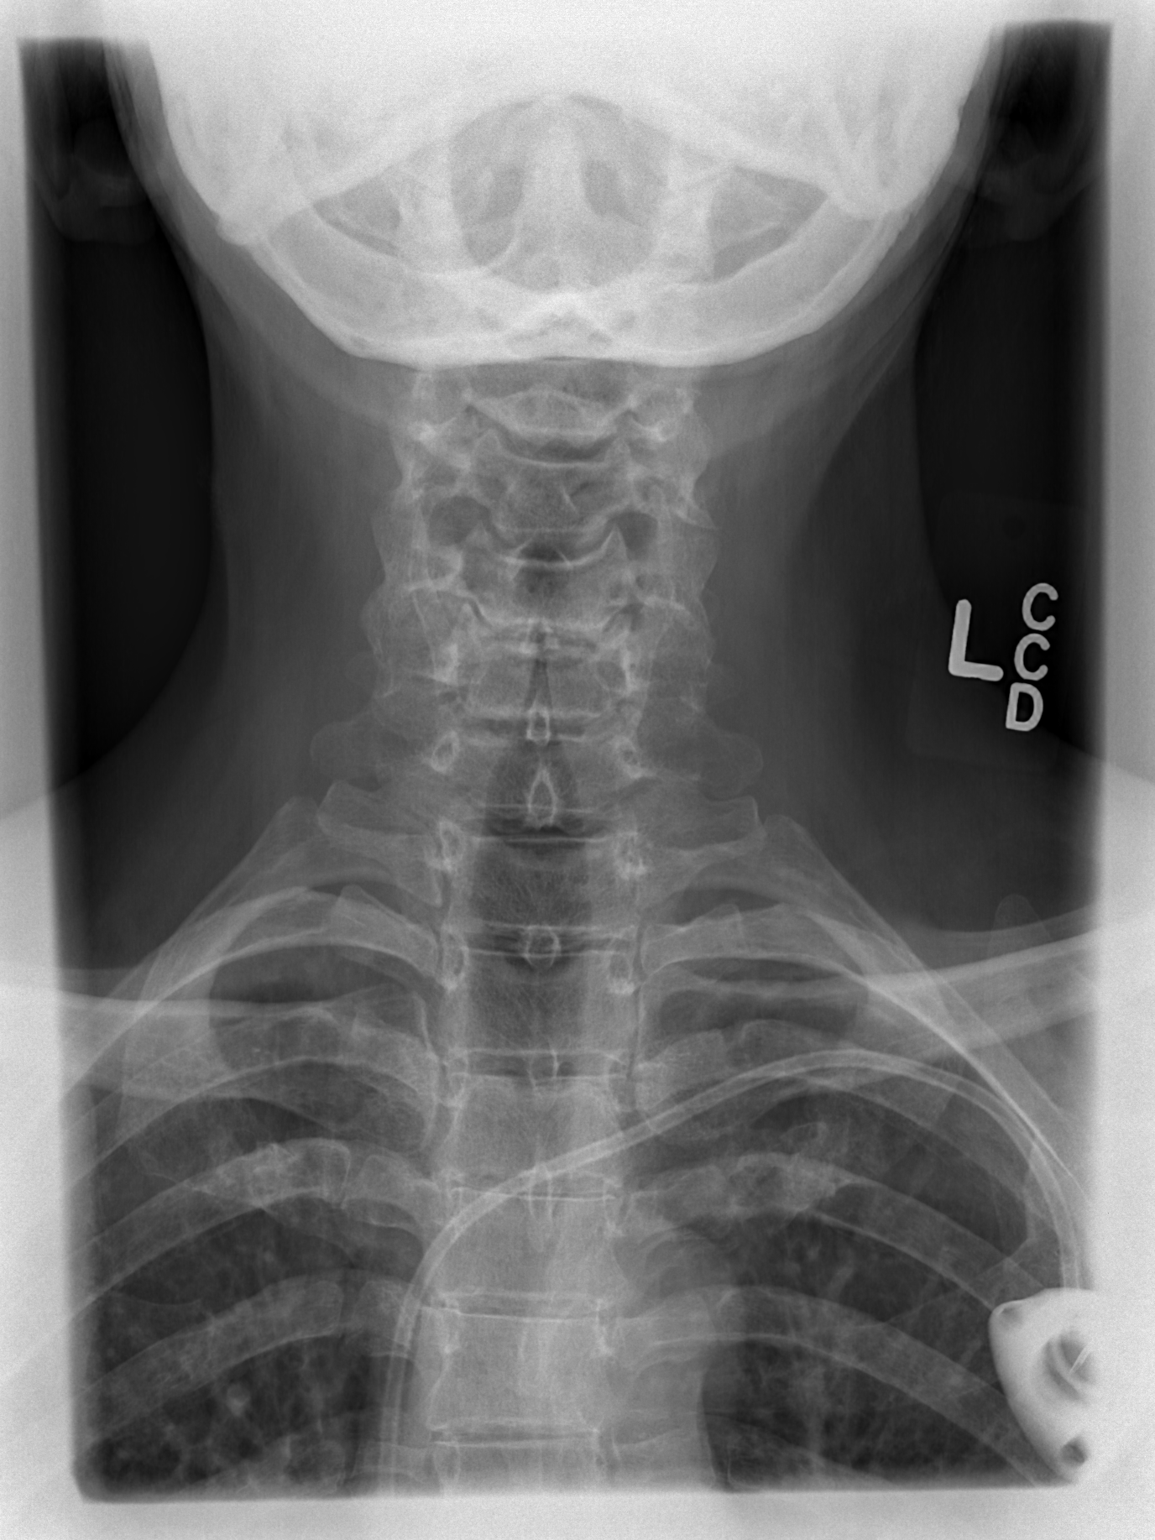

[2 of 2 positions shown; findings below may reference images not displayed]

FINDINGS: The epiglottis is normal in appearance. No precervical
soft tissue widening is present. The airway appears patent.  Port-A-
Cath partly visualized.  Mild inferior cervical spine disc
degenerative change partly visualized.
IMPRESSION: No abnormality of the soft tissue of the neck visualized.

## 2011-03-13 ENCOUNTER — Telehealth: Payer: Self-pay

## 2011-03-13 NOTE — Telephone Encounter (Signed)
Called the patient informed of MD's instructions. 

## 2011-03-13 NOTE — Telephone Encounter (Signed)
Patient has health screening form to complete and includes getting hgba1c for completion. The patient would like to come to the lab today and have the lab done.

## 2011-03-13 NOTE — Telephone Encounter (Signed)
I would have to decline, since there is no medical reason for having the test done  It is only done for those persons who have had an elevated BS, to assess overall control, and I dont think she has had this in the past   (we cant order tests simply b/c it was listed on a form)

## 2011-03-25 ENCOUNTER — Ambulatory Visit (INDEPENDENT_AMBULATORY_CARE_PROVIDER_SITE_OTHER): Payer: BC Managed Care – PPO | Admitting: Internal Medicine

## 2011-03-25 ENCOUNTER — Encounter: Payer: Self-pay | Admitting: Internal Medicine

## 2011-03-25 VITALS — BP 118/68 | HR 78 | Temp 98.8°F | Ht 62.0 in

## 2011-03-25 DIAGNOSIS — J029 Acute pharyngitis, unspecified: Secondary | ICD-10-CM

## 2011-03-25 MED ORDER — AZITHROMYCIN 250 MG PO TABS: ORAL_TABLET | ORAL | Status: AC

## 2011-03-25 MED ORDER — PREDNISONE (PAK) 10 MG PO TABS: 10.0000 mg | ORAL_TABLET | ORAL | Status: AC

## 2011-03-25 NOTE — Patient Instructions (Signed)
It was good to see you today. Pred pak and Zpack (if needed to take IF you develop worsening symptoms or fever -it does not appear necessary to use antibiotics at this time) Your prescription(s) have been submitted to your pharmacy. Please take as directed and contact our office if you believe you are having problem(s) with the medication(s). Use tylenol or advil as needed for aches and fatigue symptoms and stay hydrated Sore Throat Sore throats may be caused by bacteria and viruses. They may also be caused by:  Smoking.   Pollution.   Allergies.  If a sore throat is due to Strep infection (a bacterial infection), you may need:  A throat swab and/or   A culture test to verify the strep infection.  You will need one of these:  An antibiotic shot.   Oral medicine for a full 10 days to cure it.  Strep infection is very contagious. A doctor should check any close contacts who have a sore throat or fever. A sore throat caused by a virus infection will usually last only 3-4 days. Antibiotics will not treat a viral sore throat.   Infectious mononucleosis (a viral disease), however, can cause a sore throat that last for up to 3 weeks. Mononucleosis can be diagnosed with blood tests. You must have been sick for at least 1 week for the test to give accurate results. HOME CARE INSTRUCTIONS  To treat a sore throat, take mild pain medicine.   Increase your fluids.   Eat a soft diet.   Do not smoke.   Gargling with warm water or salt water (1 tsp. salt in 8 oz. water) can be helpful.   Try throat sprays or lozenges or sucking on hard candy will also ease the symptoms.  Call your doctor if your sore throat lasts longer than 1 week.  SEEK IMMEDIATE MEDICAL CARE IF YOU HAVE:    Breathing difficulty.   Increased swelling in the throat.   Pain so severe you are unable to swallow fluids or your saliva.   A severe headache, high fever, vomiting, or red rash.  Document Released: 07/10/2004  Document Re-Released: 08/29/2008 [facility] Patient Information 2011 [place], [place].

## 2011-03-25 NOTE — Progress Notes (Signed)
  Subjective:    Patient ID: Jaime Young, female    DOB: 11-Feb-1951, 60 y.o.   MRN: [id number]  Sore Throat  This is a new problem. The current episode started in the past 7 days. The problem has been gradually worsening. Neither side of throat is experiencing more pain than the other. There has been no fever. The pain is moderate. Associated symptoms include congestion (nasal), ear pain (right), swollen glands and trouble swallowing. Pertinent negatives include no coughing, diarrhea or shortness of breath. She has tried gargles for the symptoms. The treatment provided no relief.   Past Medical History  Diagnosis Date  . Osteopenia 09/18/2010  . Impaired glucose tolerance 11/17/2010  . Hyperlipidemia   . Anxiety   . Allergic rhinitis   . DJD (degenerative joint disease) of lumbar spine   . Diverticulitis   . Diverticulosis of colon   . Migraine   . Raynaud's disease   . DVT (deep venous thrombosis) 1977  . Cancer     stage I right breast cancer  . RADIAL NERVE INJURY   . RAYNAUD'S DISEASE   . SUPERFICIAL PHLEBITIS 09/01/2007     Review of Systems  HENT: Positive for ear pain (right), congestion (nasal) and trouble swallowing.   Respiratory: Negative for cough and shortness of breath.   Gastrointestinal: Negative for diarrhea.       Objective:   Physical Exam BP 118/68  Pulse 78  Temp(Src) 98.8 F (37.1 C) (Oral)  Ht 5\' 2"  (1.575 m)  SpO2 98% Constitutional: She is well-developed and well-nourished. No distress. hoarse HENT: Head: Normocephalic and atraumatic. Mild Ears: B TMs ok, no erythema or effusion; Nose: Nose normal. Mouth/Throat: Oropharynx is red but clear and moist. No oropharyngeal exudate.  Eyes: Conjunctivae and EOM are normal. Pupils are equal, round, and reactive to light. No scleral icterus.  Neck: Normal range of motion. Neck supple. No JVD  Present, mild anterior LAD with tenderness. No thyromegaly present.  Cardiovascular: Normal rate, regular rhythm and  normal heart sounds.  No murmur heard. No BLE edema. Pulmonary/Chest: Effort normal and breath sounds normal. No respiratory distress. She has few exp wheezes. Psychiatric: She has a normal mood and affect. Her behavior is normal. Judgment and thought content normal.   Lab Results  Component Value Date   WBC 4.0* 11/18/2010   HGB 13.4 01/23/2011   HCT 38.9 01/23/2011   PLT 152 01/23/2011   GLUCOSE 89 01/23/2011   CHOL 185 11/18/2010   TRIG 114.0 11/18/2010   HDL 51.20 11/18/2010   LDLDIRECT 160.6 12/20/2007   LDLCALC 111* 11/18/2010   ALT 12 01/23/2011   AST 18 01/23/2011   NA 140 01/23/2011   K 3.8 01/23/2011   CL 104 01/23/2011   CREATININE 0.72 01/23/2011   BUN 17 01/23/2011   CO2 29 01/23/2011   TSH 6.85* 11/18/2010   INR 1.00 02/04/2010       Assessment & Plan:   Pharyngitis, acute - likely viral but associated with hoarseness and "sinus drainage" - tx with pred as before to help inflammation and empiric Zpak to use if fever or increase sputum/congestion - reviewed same with pt who understands and agrees

## 2011-03-27 ENCOUNTER — Telehealth: Payer: Self-pay

## 2011-03-27 MED ORDER — BENZONATATE 100 MG PO CAPS: ORAL_CAPSULE | ORAL | Status: DC

## 2011-03-27 NOTE — Telephone Encounter (Signed)
Done per emr 

## 2011-03-27 NOTE — Telephone Encounter (Signed)
Called patient informed prescription sent to pharmacy

## 2011-03-27 NOTE — Telephone Encounter (Signed)
The patient saw Dr. Felicity Coyer on  03/25/2011 and has developed a cough since that OV. She cannot take OTC meds. And would like a prescription sent in for her cough. Call back number is [phone] or [phone]

## 2011-04-30 DIAGNOSIS — H18599 Other hereditary corneal dystrophies, unspecified eye: Secondary | ICD-10-CM | POA: Insufficient documentation

## 2011-05-22 ENCOUNTER — Telehealth: Payer: Self-pay | Admitting: Oncology

## 2011-05-22 NOTE — Telephone Encounter (Signed)
called pt and scheduled appts for [phone]

## 2011-06-17 HISTORY — PX: PARS PLANA VITRECTOMY: SHX2166

## 2011-06-24 DIAGNOSIS — H35359 Cystoid macular degeneration, unspecified eye: Secondary | ICD-10-CM | POA: Insufficient documentation

## 2011-06-24 DIAGNOSIS — H35379 Puckering of macula, unspecified eye: Secondary | ICD-10-CM | POA: Insufficient documentation

## 2011-07-24 ENCOUNTER — Other Ambulatory Visit (HOSPITAL_BASED_OUTPATIENT_CLINIC_OR_DEPARTMENT_OTHER): Payer: BC Managed Care – PPO | Admitting: Lab

## 2011-07-24 DIAGNOSIS — Z170 Estrogen receptor positive status [ER+]: Secondary | ICD-10-CM

## 2011-07-24 DIAGNOSIS — C50219 Malignant neoplasm of upper-inner quadrant of unspecified female breast: Secondary | ICD-10-CM

## 2011-07-24 LAB — COMPREHENSIVE METABOLIC PANEL
ALT: 10 U/L (ref 0–35)
Albumin: 4.2 g/dL (ref 3.5–5.2)
CO2: 28 mEq/L (ref 19–32)
Calcium: 9.3 mg/dL (ref 8.4–10.5)
Chloride: 104 mEq/L (ref 96–112)
Glucose, Bld: 81 mg/dL (ref 70–99)
Sodium: 141 mEq/L (ref 135–145)
Total Protein: 6.9 g/dL (ref 6.0–8.3)

## 2011-07-24 LAB — CBC WITH DIFFERENTIAL/PLATELET
BASO%: 0.4 % (ref 0.0–2.0)
Eosinophils Absolute: 0.1 10*3/uL (ref 0.0–0.5)
HCT: 38.7 % (ref 34.8–46.6)
MCHC: 33.8 g/dL (ref 31.5–36.0)
MONO#: 0.3 10*3/uL (ref 0.1–0.9)
NEUT#: 2.3 10*3/uL (ref 1.5–6.5)
NEUT%: 55.9 % (ref 38.4–76.8)
Platelets: 152 10*3/uL (ref 145–400)
RBC: 4.04 10*6/uL (ref 3.70–5.45)
WBC: 4.2 10*3/uL (ref 3.9–10.3)
lymph#: 1.5 10*3/uL (ref 0.9–3.3)

## 2011-07-31 ENCOUNTER — Ambulatory Visit (HOSPITAL_BASED_OUTPATIENT_CLINIC_OR_DEPARTMENT_OTHER): Payer: BC Managed Care – PPO | Admitting: Physician Assistant

## 2011-07-31 ENCOUNTER — Telehealth: Payer: Self-pay | Admitting: Oncology

## 2011-07-31 ENCOUNTER — Encounter: Payer: Self-pay | Admitting: Physician Assistant

## 2011-07-31 VITALS — BP 113/71 | HR 74 | Temp 97.7°F | Wt 127.3 lb

## 2011-07-31 DIAGNOSIS — Z853 Personal history of malignant neoplasm of breast: Secondary | ICD-10-CM

## 2011-07-31 MED ORDER — TAMOXIFEN CITRATE 20 MG PO TABS: 20.0000 mg | ORAL_TABLET | Freq: Every day | ORAL | Status: DC

## 2011-07-31 NOTE — Telephone Encounter (Signed)
gve the pt her may,aug 2013 appt calendar with the scan appts

## 2011-07-31 NOTE — Progress Notes (Signed)
Hematology and Oncology Follow Up Visit  Jaime Young [id number] 06/19/1950 61 y.o. 07/31/2011    HPI: Jaime Young herself palpated a mass in her right breast, and brought it to Dr. Johnn Hai attention.  He set her up for a screening diagnostic mammography on January 24 of this year.  Dr. Samuella Cota performed the procedure, found no definite mammographic abnormality corresponding to the palpable complaint.  The ultrasound of the right breast did show a mixed echogenic mass at approximately 12 o'clock, extending to the implant capsule, and measuring 2.5 cm.  There was at least one abnormal lymph node in the right axilla, measuring 2.2 cm.    With this information, the patient was recalled for biopsy performed January 31, and this showed ([id number]) an invasive ductal carcinoma with micropapillary features with the lymph node biopsy positive for a very similar looking tumor.  Grade of this tumor is not estimated.  The prognostic panel showed it to be estrogen receptor positive at 56%, progesterone receptor positive at 25%, with a borderline MIB-1 at 19%.  There was no amplification of HER2 by CISH with a ratio of 1.50.    With this information, the patient met with Dr. Dwain Sarna, and bilateral breast MRIs were obtained July 18, 2009.  There was an enhancing 2.8 cm mass corresponding to the previously biopsied malignancy, and an enlarged right axillary lymph node measuring 2.1 cm.  The left breast was unremarkable.   Jaime Young was treated in the neoadjuvant setting, and received 4 cycles of dose dense doxorubicin/cyclophosphamide followed by 3 doses of weekly paclitaxel. 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 This was followed by carboplatin and gemcitabine for 2 cycles, but with very poor tolerance. Chemotherapy was completed in July of 2011.  The patient underwent right lumpectomy and axillary lymph node dissection in August of 2011, for residual 1.5 cm invasive ductal carcinoma, grade  2, with 2 of 11 lymph nodes involved. Margins were negative.  Patient proceeded to radiation therapy, completed in November of 2011, at which time she began on letrozole. This was poorly tolerated and was discontinued in April of 2012. She began on tamoxifen in May of 2012, 20 mg daily, which she is tolerating well. The plan is to continue tamoxifen for a minimum of 2-3 years at which point we may switch her to exemestane.  Interim History:   Jaime Young returns today for routine followup of her right breast carcinoma. Interval history is basically unremarkable, with the exception of a lot of stress at work. Jaime Young is working lots of hours, and is under a lot of pressure. This is causing her some anxiety, and occasionally some problems sleeping.  Otherwise, her family is doing great, and she is enjoying spending time with her newest granddaughter, Jaime Young, who is now 44 months old.  Jaime Young is tolerating the tamoxifen well. She has some occasional hot flashes. Her skin is a little dry, and she has some vaginal dryness. 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 She does still have some numbness in the right axilla and upper arm secondary to her surgery. Her right breast is somewhat tender at times. She's noted no abnormalities in the breast. She's had no change in vision and continues to be followed by Dr. Eustaquio Maize. She's had no peripheral swelling, and no evidence of abnormal clotting.  A detailed review of systems is otherwise noncontributory as noted below.  Review of Systems: Constitutional: anxiety, fatigue, no weight loss, fever, night sweats Eyes: uses glasses [id number] Cardiovascular: no  chest pain or dyspnea on exertion Respiratory: no cough, shortness of breath, or wheezing Neurological: no TIA or stroke symptoms negative Dermatological: negative Gastrointestinal: no abdominal pain, change in bowel habits, or black or bloody stools Genito-Urinary: no dysuria, trouble  voiding, or hematuria Hematological and Lymphatic: negative Breast: negative Musculoskeletal: negative Remaining ROS negative.  PAST MEDICAL HISTORY:  The past medical history is significant for endometriosis.  The patient being status post TAH-BSO in 1983.  She underwent a hemorrhoidectomy remotely under [employer].  Of course she has breast implants in place.  These were removed and replaced in 2001 because of contractures.  She is status post tonsillectomy and adenoidectomy.    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 Eustaquio Maize.  She has also had left eye cataract surgery.  She has a history of migraines, a history of mild hypercholesterolemia, and a history of osteopenia.   FAMILY HISTORY:  The patient's  father died at the age of 62 from what sounds like a gastroesophageal junction tumor.  The patient's  mother died at the age of 37 after multiple strokes.  The patient has five brothers and three sisters, all of her siblings are half-siblings.  The problems are all on her father's side.  The sister, [name], had breast and ovarian cancer.  She is deceased, and was never tested for BRCA1 and 2.  Second sister had throat cancer.  There is no other history of cancer in the immediate family to her knowledge.    GYNECOLOGIC HISTORY:  She is GXP1.  First pregnancy to term age 15.  She took estrogen replacement between 1983 and 2010.    SOCIAL HISTORY:  She works for [employer] here in town.  Her husband, Jaime Young, works for [employer] in the Teaching laboratory technician.  Daughter, [name], now 65, is a stay-at-home mom with two boys and a girl at home.  The patient attends a [employer].    Medications:   I have reviewed the patient's current medications.  Current Outpatient Prescriptions  Medication Sig Dispense Refill  . Calcium Carbonate  (CALCIUM 600) 1500 MG TABS Take 1 tablet by mouth daily.        . Cholecalciferol (VITAMIN D3) 1000 UNITS tablet Take 1,000 Units by mouth daily.        . Coenzyme Q10 100 MG capsule Take 100 mg by mouth daily.        . nepafenac (NEVANAC) 0.1 % ophthalmic suspension Place 1 drop into both eyes 2 (two) times daily.        . Omega-3 Fatty Acids (FISH OIL) 1000 MG CAPS Take 1 capsule by mouth daily.        . Red Yeast Rice 600 MG TABS Take 1 tablet by mouth daily.        . tamoxifen (NOLVADEX) 20 MG tablet Take 1 tablet (20 mg total) by mouth daily.  90 tablet  3  . DISCONTD: tamoxifen (NOLVADEX) 20 MG tablet Take by mouth daily.          Allergies:  Allergies  Allergen Reactions  . Ciprofloxacin   . Codeine   . Penicillins   . Sulfonamide Derivatives     Physical Exam: Filed Vitals:   07/31/11 1510  BP: 113/71  Pulse: 74  Temp: 97.7 F (36.5 C)   HEENT:  Sclerae anicteric, conjunctivae pink.  Oropharynx clear.  No mucositis or candidiasis.   Nodes:  No cervical, supraclavicular, or axillary lymphadenopathy palpated.  Breast Exam:  Right breast status post lumpectomy. No suspicious nodularities or skin changes. No evidence of local recurrence. Left breast is benign.   Lungs:  Clear to auscultation bilaterally.  No crackles, rhonchi, or wheezes.   Heart:  Regular rate and rhythm.  No gallops, murmurs, or rubs. Abdomen:  Soft, nontender.  Positive bowel sounds.  No organomegaly or masses palpated.   Musculoskeletal:  No focal spinal tenderness to palpation.  Extremities:  Benign.  No peripheral edema or cyanosis.   Skin:  Benign.   Neuro:  Nonfocal.   Lab Results: Lab Results  Component Value Date   WBC 4.2 07/24/2011   HGB 13.1 07/24/2011   HCT 38.7 07/24/2011   MCV 95.8 07/24/2011   PLT 152 07/24/2011   NEUTROABS 2.3 07/24/2011     Chemistry      Component Value Date/Time   NA 141 07/24/2011 1609   K 3.4* 07/24/2011 1609   CL 104 07/24/2011 1609   CO2 28 07/24/2011 1609   BUN 11  07/24/2011 1609   CREATININE 0.76 07/24/2011 1609      Component Value Date/Time   CALCIUM 9.3 07/24/2011 1609   ALKPHOS 39 07/24/2011 1609   AST 17 07/24/2011 1609   ALT 10 07/24/2011 1609   BILITOT 0.3 07/24/2011 1609        Radiological Studies:  Mammogram is scheduled at [facility] for February 28.   Assessment:  A 61 year old [place] woman   (1)  status post right breast and axillary lymph node biopsy January 2011, both positive for a clinical T2 N1 invasive ductal carcinoma which was estrogen and progesterone receptor-positive, HER-2/neu-negative with an MIB-1 of 19%.   (2) Neoadjuvantly she received 4 cycles of dose-dense doxorubicin and cyclophosphamide followed by 3 doses of weekly paclitaxel discontinued secondary to neuropathy (which has resolved), followed by carboplatin and gemcitabine for 2 cycles with very poor tolerance.    (3)  She completed chemotherapy in July 2011 and underwent right lumpectomy and axillary lymph node dissection August 2011 for a residual 1.5 cm invasive ductal carcinoma, grade 2, involving 2/11 lymph nodes.  Margins were negative.    (4)  After completing radiation therapy in November 2011, she started letrozole but this had to be discontinued April 2012 because of tolerance issues.  She was started on tamoxifen May 2012.   Plan:  With regards to her breast cancer, Jaime Young is doing extremely well with no evidence of disease recurrence at this time. She'll continue on tamoxifen as before and I have refilled this prescription for her.  Cindy and I spent time together discussing her anxiety. We did discuss the possibility of an anti-anxiety medication such as Ativan, or perhaps something she would take every day like Celexa. At this time, she does not want to take another medication but would like to "think about it for the future". If we did start her on something, I would recommend Celexa, perhaps beginning at a low dose of 10 mg daily which I think she would  tolerate well.  I will see Jaime Young again in 3 months. She will then have restaging scans at the 2 year mark in August of 2013, including a chest CT and bone scan. She'll see Dr. Darnelle Catalan soon thereafter to review those results, and if all is well, we will likely begin seeing her on a every 6 month basis at that time.   This  plan was reviewed with the patient, who voices understanding and agreement.  She knows to call with any changes or problems.    Shaiden Aldous, PA-C 07/31/2011

## 2011-08-04 ENCOUNTER — Other Ambulatory Visit: Payer: Self-pay | Admitting: Gynecology

## 2011-08-21 ENCOUNTER — Encounter: Payer: Self-pay | Admitting: Oncology

## 2011-10-28 ENCOUNTER — Other Ambulatory Visit (HOSPITAL_BASED_OUTPATIENT_CLINIC_OR_DEPARTMENT_OTHER): Payer: BC Managed Care – PPO

## 2011-10-28 DIAGNOSIS — Z853 Personal history of malignant neoplasm of breast: Secondary | ICD-10-CM

## 2011-10-28 DIAGNOSIS — D649 Anemia, unspecified: Secondary | ICD-10-CM

## 2011-10-28 LAB — CBC WITH DIFFERENTIAL/PLATELET
BASO%: 0.5 % (ref 0.0–2.0)
LYMPH%: 31.3 % (ref 14.0–49.7)
MCHC: 34 g/dL (ref 31.5–36.0)
MONO#: 0.3 10*3/uL (ref 0.1–0.9)
NEUT#: 2.4 10*3/uL (ref 1.5–6.5)
RBC: 4.1 10*6/uL (ref 3.70–5.45)
RDW: 12.6 % (ref 11.2–14.5)
WBC: 3.9 10*3/uL (ref 3.9–10.3)
lymph#: 1.2 10*3/uL (ref 0.9–3.3)
nRBC: 0 % (ref 0–0)

## 2011-10-28 LAB — COMPREHENSIVE METABOLIC PANEL
ALT: 10 U/L (ref 0–35)
AST: 17 U/L (ref 0–37)
Calcium: 8.8 mg/dL (ref 8.4–10.5)
Chloride: 103 mEq/L (ref 96–112)
Creatinine, Ser: 0.75 mg/dL (ref 0.50–1.10)
Potassium: 3.4 mEq/L — ABNORMAL LOW (ref 3.5–5.3)
Sodium: 140 mEq/L (ref 135–145)
Total Protein: 6.6 g/dL (ref 6.0–8.3)

## 2011-11-04 ENCOUNTER — Ambulatory Visit (HOSPITAL_BASED_OUTPATIENT_CLINIC_OR_DEPARTMENT_OTHER): Payer: BC Managed Care – PPO | Admitting: Physician Assistant

## 2011-11-04 ENCOUNTER — Encounter: Payer: Self-pay | Admitting: Physician Assistant

## 2011-11-04 VITALS — BP 118/72 | HR 76 | Temp 98.2°F | Ht 62.0 in | Wt 129.1 lb

## 2011-11-04 DIAGNOSIS — Z79810 Long term (current) use of selective estrogen receptor modulators (SERMs): Secondary | ICD-10-CM

## 2011-11-04 DIAGNOSIS — Z853 Personal history of malignant neoplasm of breast: Secondary | ICD-10-CM

## 2011-11-04 DIAGNOSIS — Z170 Estrogen receptor positive status [ER+]: Secondary | ICD-10-CM

## 2011-11-04 DIAGNOSIS — C50919 Malignant neoplasm of unspecified site of unspecified female breast: Secondary | ICD-10-CM

## 2011-11-04 DIAGNOSIS — E876 Hypokalemia: Secondary | ICD-10-CM

## 2011-11-04 MED ORDER — POTASSIUM CHLORIDE CRYS ER 10 MEQ PO TBCR: 10.0000 meq | EXTENDED_RELEASE_TABLET | Freq: Every day | ORAL | Status: DC

## 2011-11-04 NOTE — Progress Notes (Signed)
ID: Jaime Young   DOB: 1950-07-06  MR#: [id number]  CSN#:[id number]  HISTORY OF PRESENT ILLNESS: Jaime Young herself palpated a mass in her right breast, and brought it to Dr. Johnn Hai attention. He set her up for a screening diagnostic mammography on January 24 of this year. Dr. Samuella Cota performed the procedure, found no definite mammographic abnormality corresponding to the palpable complaint. The ultrasound of the right breast did show a mixed echogenic mass at approximately 12 o'clock, extending to the implant capsule, and measuring 2.5 cm. There was at least one abnormal lymph node in the right axilla, measuring 2.2 cm.  With this information, the patient was recalled for biopsy performed January 31, and this showed ([id number]) an invasive ductal carcinoma with micropapillary features with the lymph node biopsy positive for a very similar looking tumor. Grade of this tumor is not estimated. The prognostic panel showed it to be estrogen receptor positive at 56%, progesterone receptor positive at 25%, with a borderline MIB-1 at 19%. There was no amplification of HER2 by CISH with a ratio of 1.50.  With this information, the patient met with Dr. Dwain Sarna, and bilateral breast MRIs were obtained July 18, 2009. There was an enhancing 2.8 cm mass corresponding to the previously biopsied malignancy, and an enlarged right axillary lymph node measuring 2.1 cm. The left breast was unremarkable.  Jaime Young was treated in the neoadjuvant setting, and received 4 cycles of dose dense doxorubicin/cyclophosphamide followed by 3 doses of weekly paclitaxel. 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 This was followed by carboplatin and gemcitabine for 2 cycles, but with very poor tolerance. Chemotherapy was completed in July of 2011.  The patient underwent right lumpectomy and axillary lymph node dissection in August of 2011, for residual 1.5 cm invasive ductal carcinoma, grade 2, with 2 of 11 lymph nodes  involved. Margins were negative.  Patient proceeded to radiation therapy, completed in November of 2011, at which time she began on letrozole. This was poorly tolerated and was discontinued in April of 2012. She began on tamoxifen in May of 2012, 20 mg daily, which she is tolerating well. The plan is to continue tamoxifen for a minimum of 2-3 years at which point we may switch her to exemestane.  INTERVAL HISTORY: Jaime Young returns today for routine followup of her right breast carcinoma. Interval history is unremarkable. Her family is doing well. She and Jaime Young recently took a weekend trip to [place] and [place] and are [employer] trip this summer. Jaime Young is still working a lot hours, and is under a lot of stress at work. She seems to be coping with this in a healthy way. She's participating in a "health challenge" at work, and is trying to walk 70,000 steps per day.  Jaime Young continues on tamoxifen, 20 mg daily. She's had no increased hot flashes. No vaginal dryness, discharge, or bleeding. She has chronic peripheral swelling, but nothing that has changed, and no evidence of abnormal clotting. No change in vision.  REVIEW OF SYSTEMS: Jaime Young also denies any recent illnesses and has had no fevers or chills. No rashes or abnormal bleeding. No nausea or change in bowel habits. No chest pain or shortness of breath. No abnormal headaches or dizziness. No unusual myalgias, arthralgias, or bony pain. She does have occasional cramping in her toes.  A detailed review of systems is otherwise noncontributory.  PAST MEDICAL HISTORY: Past Medical History  Diagnosis Date  . Osteopenia 09/18/2010  . Impaired glucose tolerance 11/17/2010  . Hyperlipidemia   .  Anxiety   . Allergic rhinitis   . DJD (degenerative joint disease) of lumbar spine   . Diverticulitis   . Diverticulosis of colon   . Migraine   . Raynaud's disease   . DVT (deep venous thrombosis) 1977  . Cancer     stage I right breast cancer  . RADIAL  NERVE INJURY   . RAYNAUD'S DISEASE   . SUPERFICIAL PHLEBITIS 09/01/2007    PAST SURGICAL HISTORY: Past Surgical History  Procedure Date  . Total abdominal hysterectomy w/ bilateral salpingoophorectomy 1983    secondary to edometriosis  . Young surgery 2002  . Hemorrhoid surgery 1983  . Tonsillectomy   . Breast surgery 1989/2001    implants  . Breast lumpectomy     axillary node dissection    FAMILY HISTORY Family History  Problem Relation Age of Onset  . Stroke Mother   . Heart disease Father   . Cancer Father   . Cancer Sister   . Hypertension Mother   . Breast cancer Other   . Bone cancer Other   . Stomach cancer Other   The patient's father died at the age of 62 from what sounds like a gastroesophageal junction tumor. The patient's mother died at the age of 15 after multiple strokes. The patient has five brothers and three sisters, all of her siblings are half-siblings. The problems are all on her father's side. The sister, Jaime Young, had breast and ovarian cancer. She is deceased, and was never tested for BRCA1 and 2. Second sister had throat cancer. There is no other history of cancer in the immediate family to her knowledge.    GYNECOLOGIC HISTORY: She is GXP1. First pregnancy to term age 23. She took estrogen replacement between 1983 and 2010.   SOCIAL HISTORY: She works for [employer] here in town. Her husband, Jaime Young, works for [employer] in the Teaching laboratory technician. Daughter, Jaime Young, now 74, is a stay-at-home mom with two boys and a girl at home. The patient attends a [employer].     ADVANCED DIRECTIVES:  HEALTH MAINTENANCE: History  Substance Use Topics  . Smoking status: Former Games developer  . Smokeless tobacco: Not on file  . Alcohol Use: No     Colonoscopy:  PAP:  Bone density:  Lipid panel: controlled  Allergies  Allergen Reactions  . Ciprofloxacin   . Codeine   . Penicillins   . Sulfonamide Derivatives     Current Outpatient  Prescriptions  Medication Sig Dispense Refill  . Calcium Carbonate (CALCIUM 600) 1500 MG TABS Take 1 tablet by mouth daily.        . Cholecalciferol (VITAMIN D3) 1000 UNITS tablet Take 1,000 Units by mouth daily.        . Coenzyme Q10 100 MG capsule Take 100 mg by mouth daily.        . nepafenac (NEVANAC) 0.1 % ophthalmic suspension Place 1 drop into both eyes daily.       . Omega-3 Fatty Acids (FISH OIL) 1000 MG CAPS Take 1 capsule by mouth daily.        . Red Yeast Rice 600 MG TABS Take 1 tablet by mouth daily.        . tamoxifen (NOLVADEX) 20 MG tablet Take 1 tablet (20 mg total) by mouth daily.  90 tablet  3  . potassium chloride SA (K-DUR,KLOR-CON) 10 MEQ tablet Take 1 tablet (10 mEq total) by mouth daily.  90 tablet  1    OBJECTIVE: Filed Vitals:  11/04/11 1509  BP: 118/72  Pulse: 76  Temp: 98.2 F (36.8 C)     Body mass index is 23.61 kg/(m^2).    ECOG FS: 0  Filed Weights   11/04/11 1509  Weight: 129 lb 1.6 oz (58.559 kg)   Physical Exam: HEENT:  Sclerae anicteric, conjunctivae pink.  Oropharynx clear.    Nodes:  No cervical, supraclavicular, or axillary lymphadenopathy palpated.  Breast Exam:  Implants are intact bilaterally. Right breast is status post lumpectomy with no suspicious nodularity or skin changes. No evidence of local recurrence. Left breast is benign. Lungs:  Clear to auscultation bilaterally.  No crackles, rhonchi, or wheezes.   Heart:  Regular rate and rhythm.   Abdomen:  Soft, nontender.  Positive bowel sounds.  No organomegaly or masses palpated.   Musculoskeletal:  No focal spinal tenderness to palpation.  Extremities:  Benign.  No peripheral edema or cyanosis.   Skin:  Benign.   Neuro:  Nonfocal. Alert and oriented x3.    LAB RESULTS: Lab Results  Component Value Date   WBC 3.9 10/28/2011   NEUTROABS 2.4 10/28/2011   HGB 13.3 10/28/2011   HCT 39.0 10/28/2011   MCV 95.0 10/28/2011   PLT 154 10/28/2011      Chemistry      Component Value  Date/Time   NA 140 10/28/2011 1552   K 3.4* 10/28/2011 1552   CL 103 10/28/2011 1552   CO2 29 10/28/2011 1552   BUN 11 10/28/2011 1552   CREATININE 0.75 10/28/2011 1552      Component Value Date/Time   CALCIUM 8.8 10/28/2011 1552   ALKPHOS 36* 10/28/2011 1552   AST 17 10/28/2011 1552   ALT 10 10/28/2011 1552   BILITOT 0.2* 10/28/2011 1552       Lab Results  Component Value Date   LABCA2 10 10/28/2011    STUDIES:  Most recent bilateral mammogram at [facility] on 08/11/2011 was unremarkable.  ASSESSMENT: A 61 year old [place] woman  (1) status post right breast and axillary lymph node biopsy January 2011, both positive for a clinical T2 N1 invasive ductal carcinoma which was estrogen and progesterone receptor-positive, HER-2/neu-negative with an MIB-1 of 19%.  (2) Neoadjuvantly she received 4 cycles of dose-dense doxorubicin and cyclophosphamide followed by 3 doses of weekly paclitaxel discontinued secondary to neuropathy (which has resolved), followed by carboplatin and gemcitabine for 2 cycles with very poor tolerance.  (3) She completed chemotherapy in July 2011 and underwent right lumpectomy and axillary lymph node dissection August 2011 for a residual 1.5 cm invasive ductal carcinoma, grade 2, involving 2/11 lymph nodes. Margins were negative.  (4) After completing radiation therapy in November 2011, she started letrozole but this had to be discontinued April 2012 because of tolerance issues. She was started on tamoxifen May 2012.   PLAN: Jaime Young continues to tolerate the tamoxifen well and will continue as before. I am also starting her on a low dose potassium supplement for her history of mild but slightly symptomatic hypokalemia. Overall she seems to be doing extremely well, and she will see Dr. Darnelle Catalan again in 3 months. Prior to that appointment in August, she will have restaging scans at the 2 year mark, and these have already been scheduled for August 13.  Cindy voices understanding  and agreement with our plan and will call with any changes or problems.   Juanangel Soderholm    11/04/2011

## 2011-11-28 ENCOUNTER — Other Ambulatory Visit: Payer: Self-pay | Admitting: Medical Oncology

## 2012-01-27 ENCOUNTER — Other Ambulatory Visit (HOSPITAL_BASED_OUTPATIENT_CLINIC_OR_DEPARTMENT_OTHER): Payer: BC Managed Care – PPO | Admitting: Lab

## 2012-01-27 ENCOUNTER — Ambulatory Visit (HOSPITAL_COMMUNITY)
Admission: RE | Admit: 2012-01-27 | Discharge: 2012-01-27 | Disposition: A | Discharge status: Home / Self Care | Payer: BC Managed Care – PPO | Source: Ambulatory Visit | Attending: Oncology | Admitting: Oncology

## 2012-01-27 ENCOUNTER — Encounter (HOSPITAL_COMMUNITY): Payer: Self-pay

## 2012-01-27 ENCOUNTER — Other Ambulatory Visit: Payer: Self-pay | Admitting: Oncology

## 2012-01-27 ENCOUNTER — Encounter (HOSPITAL_COMMUNITY)
Admission: RE | Admit: 2012-01-27 | Discharge: 2012-01-27 | Disposition: A | Discharge status: Home / Self Care | Payer: BC Managed Care – PPO | Source: Ambulatory Visit | Attending: Oncology | Admitting: Oncology

## 2012-01-27 DIAGNOSIS — Z923 Personal history of irradiation: Secondary | ICD-10-CM | POA: Insufficient documentation

## 2012-01-27 DIAGNOSIS — C50219 Malignant neoplasm of upper-inner quadrant of unspecified female breast: Secondary | ICD-10-CM

## 2012-01-27 DIAGNOSIS — Z9221 Personal history of antineoplastic chemotherapy: Secondary | ICD-10-CM | POA: Insufficient documentation

## 2012-01-27 DIAGNOSIS — C50919 Malignant neoplasm of unspecified site of unspecified female breast: Secondary | ICD-10-CM

## 2012-01-27 DIAGNOSIS — J984 Other disorders of lung: Secondary | ICD-10-CM | POA: Insufficient documentation

## 2012-01-27 DIAGNOSIS — Y842 Radiological procedure and radiotherapy as the cause of abnormal reaction of the patient, or of later complication, without mention of misadventure at the time of the procedure: Secondary | ICD-10-CM | POA: Insufficient documentation

## 2012-01-27 DIAGNOSIS — T66XXXA Radiation sickness, unspecified, initial encounter: Secondary | ICD-10-CM | POA: Insufficient documentation

## 2012-01-27 LAB — CMP (CANCER CENTER ONLY)
ALT(SGPT): 22 U/L (ref 10–47)
Albumin: 3.9 g/dL (ref 3.3–5.5)
BUN, Bld: 12 mg/dL (ref 7–22)
CO2: 30 mEq/L (ref 18–33)
Calcium: 9.2 mg/dL (ref 8.0–10.3)
Chloride: 102 mEq/L (ref 98–108)
Creat: 1 mg/dl (ref 0.6–1.2)
Potassium: 4 mEq/L (ref 3.3–4.7)

## 2012-01-27 LAB — CBC WITH DIFFERENTIAL/PLATELET
BASO%: 0.7 % (ref 0.0–2.0)
EOS%: 1.6 % (ref 0.0–7.0)
HCT: 40.7 % (ref 34.8–46.6)
MCHC: 34 g/dL (ref 31.5–36.0)
MONO#: 0.3 10*3/uL (ref 0.1–0.9)
NEUT%: 63.6 % (ref 38.4–76.8)
RBC: 4.26 10*6/uL (ref 3.70–5.45)
RDW: 13.1 % (ref 11.2–14.5)
WBC: 4.1 10*3/uL (ref 3.9–10.3)
lymph#: 1.1 10*3/uL (ref 0.9–3.3)

## 2012-01-27 LAB — CANCER ANTIGEN 27.29: CA 27.29: 14 U/mL (ref 0–39)

## 2012-01-27 MED ORDER — TECHNETIUM TC 99M MEDRONATE IV KIT
25.0000 | PACK | Freq: Once | INTRAVENOUS | Status: AC | PRN
  Administered 2012-01-27: 25 via INTRAVENOUS

## 2012-01-27 MED ORDER — IOHEXOL 300 MG/ML  SOLN
80.0000 mL | Freq: Once | INTRAMUSCULAR | Status: AC | PRN
  Administered 2012-01-27: 80 mL via INTRAVENOUS

## 2012-02-05 ENCOUNTER — Ambulatory Visit (HOSPITAL_BASED_OUTPATIENT_CLINIC_OR_DEPARTMENT_OTHER): Payer: BC Managed Care – PPO | Admitting: Oncology

## 2012-02-05 DIAGNOSIS — C773 Secondary and unspecified malignant neoplasm of axilla and upper limb lymph nodes: Secondary | ICD-10-CM

## 2012-02-05 DIAGNOSIS — Z853 Personal history of malignant neoplasm of breast: Secondary | ICD-10-CM

## 2012-02-05 DIAGNOSIS — Z170 Estrogen receptor positive status [ER+]: Secondary | ICD-10-CM

## 2012-02-05 DIAGNOSIS — C50419 Malignant neoplasm of upper-outer quadrant of unspecified female breast: Secondary | ICD-10-CM

## 2012-02-05 NOTE — Progress Notes (Signed)
ID: Jaime Young   DOB: [birth year]  MR#: [id number]  CSN#:[id number]  HISTORY OF PRESENT ILLNESS:  She herself palpated a mass in her right breast, and brought it to Dr. Johnn Hai attention.  He set her up for a screening diagnostic mammography on January 24 of this year.  Dr. Samuella Cota performed the procedure, found no definite mammographic abnormality corresponding to the palpable complaint.  The ultrasound of the right breast did show a mixed echogenic mass at approximately 12 o'clock, extending to the implant capsule, and measuring 2.5 cm.  There was at least one abnormal lymph node in the right axilla, measuring 2.2 cm.    With this information, the patient was recalled for biopsy performed January 31, and this showed ([id number]) an invasive ductal carcinoma with micropapillary features with the lymph node biopsy positive for a very similar looking tumor.  Grade of this tumor is not estimated.  The prognostic panel showed it to be estrogen receptor positive at 56%, progesterone receptor positive at 25%, with a borderline MIB-1 at 19%.  There was no amplification of HER2 by CISH with a ratio of 1.50.    With this information, the patient met with Dr. Dwain Sarna, and bilateral breast MRIs were obtained July 18, 2009.  There was an enhancing 2.8 cm mass corresponding to the previously biopsied malignancy, and an enlarged right axillary lymph node measuring 2.1 cm.  The left breast was unremarkable.  Dr. Dwain Sarna discussed these findings with the patient, and suggested neoadjuvant treatment.  Her only option would be a modified radical mastectomy.  He felt it was most reasonable to start with chemotherapy with a view to possibly making lumpectomy and radiation therapy possible.  Her subsequent history is as detailed below.  INTERVAL HISTORY: Ruvi returns today with her husband Brett Canales for followup of her breast cancer. The interval history is generally stable. She continues to work for [employer].   REVIEW OF SYSTEMS: She has had some headaches, which she attributes to work related stress. She's also complaining of some hearing loss. She had her uterus cleaned in Dr. Raphael Gibney office, but that did not help. In addition she's had 2 teeth recently cracked. She thinks she may be grinding her teeth at night, and again secondary to stress. She describes herself is fatigued, but she still uses her treadmill and takes walks regularly. She has rare palpitations, which is not a new symptom for her. Not long ago they took a week off, and she had plenty of energy and felt considerably better. She has some soreness in her right axilla at times. Overall though a detailed review of systems was not suggestive of disease recurrence.  PAST MEDICAL HISTORY: Past Medical History  Diagnosis Date  . Osteopenia 09/18/2010  . Impaired glucose tolerance 11/17/2010  . Hyperlipidemia   . Anxiety   . Allergic rhinitis   . DJD (degenerative joint disease) of lumbar spine   . Diverticulitis   . Diverticulosis of colon   . Migraine   . Raynaud's disease   . DVT (deep venous thrombosis) 1977  . Cancer     stage I right breast cancer  . RADIAL NERVE INJURY   . RAYNAUD'S DISEASE   . SUPERFICIAL PHLEBITIS 09/01/2007  The past medical history is significant for endometriosis.  The patient being status post TAH-BSO in 1983.  She underwent a hemorrhoidectomy remotely under [employer].  Of course she has breast implants in place.  These were removed and replaced in 2001 because  of contractures.  She is status post tonsillectomy and adenoidectomy.    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 Eustaquio Maize.  She has also had left eye cataract surgery.  She has a history of migraines, a history of mild hypercholesterolemia, and a history of osteopenia.   PAST SURGICAL HISTORY: Past  Surgical History  Procedure Date  . Total abdominal hysterectomy w/ bilateral salpingoophorectomy 1983    secondary to edometriosis  . Young surgery 2002  . Hemorrhoid surgery 1983  . Tonsillectomy   . Breast surgery 1989/2001    implants  . Breast lumpectomy     axillary node dissection    FAMILY HISTORY Family History  Problem Relation Age of Onset  . Stroke Mother   . Heart disease Father   . Cancer Father   . Cancer Sister   . Hypertension Mother   . Breast cancer Other   . Bone cancer Other   . Stomach cancer Other   The patient's  father died at the age of 68 from what sounds like a gastroesophageal junction tumor.  The patient's  mother died at the age of 81 after multiple strokes.  The patient has five brothers and three sisters, all of her siblings are half-siblings.  The problems are all on her father's side.  The sister, [name], had breast and ovarian cancer.  She is deceased, and was never tested for BRCA1 and 2.  Second sister had throat cancer.  There is no other history of cancer in the immediate family to her knowledge.    GYNECOLOGIC HISTORY: She is GXP1.  First pregnancy to term age 61.  She took estrogen replacement between 1983 and 2010.  Just went off the [place] patch.  She has not noticed significant problems with hot flashes so far, but does feel a lot more emotional she says, and it is possible that the hormone withdrawal may be contributing to that, although her current situation is quite emotional.   SOCIAL HISTORY: She works for the [employer] here in town.  Her husband, Brett Canales, works for [employer] in the Teaching laboratory technician.  Daughter, [name], now 62, is a stay-at-home mom with two boys at home.  The patient attends a [employer].     ADVANCED DIRECTIVES: Not in place  HEALTH MAINTENANCE: History  Substance Use Topics  . Smoking status: Former Games developer  . Smokeless tobacco: Not on file  . Alcohol Use: No     Colonoscopy:  PAP:  Bone  density:  Lipid panel:  Allergies  Allergen Reactions  . Ciprofloxacin   . Codeine   . Penicillins   . Sulfonamide Derivatives     Current Outpatient Prescriptions  Medication Sig Dispense Refill  . Calcium Carbonate (CALCIUM 600) 1500 MG TABS Take 1 tablet by mouth daily.        . Cholecalciferol (VITAMIN D3) 1000 UNITS tablet Take 1,000 Units by mouth daily.        . Coenzyme Q10 100 MG capsule Take 100 mg by mouth daily.        . nepafenac (NEVANAC) 0.1 % ophthalmic suspension Place 1 drop into both eyes daily.       . Omega-3 Fatty Acids (FISH OIL) 1000 MG CAPS Take 1 capsule by mouth daily.        . potassium chloride SA (K-DUR,KLOR-CON) 10 MEQ tablet Take 1 tablet (10  mEq total) by mouth daily.  90 tablet  1  . Red Yeast Rice 600 MG TABS Take 1 tablet by mouth daily.        . tamoxifen (NOLVADEX) 20 MG tablet Take 1 tablet (20 mg total) by mouth daily.  90 tablet  3    OBJECTIVE: middle-aged white woman in no acute distress  Weight was 129.5, blood pressure 112/79, heart rate 73, respiratory rate 18, and temperature 98.2   ECOG FS:  Sclerae unicteric Oropharynx clear No cervical or supraclavicular adenopathy Lungs no rales or rhonchi Heart regular rate and rhythm Abd benign MSK no focal spinal tenderness, no peripheral edema Neuro: nonfocal Breasts: the right breast is status post lumpectomy and radiation; there is no evidence of disease recurrence. The left breast is unremarkable.  LAB RESULTS: Lab Results  Component Value Date   WBC 4.1 01/27/2012   NEUTROABS 2.6 01/27/2012   HGB 13.8 01/27/2012   HCT 40.7 01/27/2012   MCV 95.6 01/27/2012   PLT 167 01/27/2012      Chemistry      Component Value Date/Time   NA 142 01/27/2012 0942   NA 140 10/28/2011 1552   K 4.0 01/27/2012 0942   K 3.4* 10/28/2011 1552   CL 102 01/27/2012 0942   CL 103 10/28/2011 1552   CO2 30 01/27/2012 0942   CO2 29 10/28/2011 1552   BUN 12 01/27/2012 0942   BUN 11 10/28/2011 1552   CREATININE  1.0 01/27/2012 0942   CREATININE 0.75 10/28/2011 1552      Component Value Date/Time   CALCIUM 9.2 01/27/2012 0942   CALCIUM 8.8 10/28/2011 1552   ALKPHOS 41 01/27/2012 0942   ALKPHOS 36* 10/28/2011 1552   AST 27 01/27/2012 0942   AST 17 10/28/2011 1552   ALT 10 10/28/2011 1552   BILITOT 0.50 01/27/2012 0942   BILITOT 0.2* 10/28/2011 1552       Lab Results  Component Value Date   LABCA2 14 01/27/2012    No components found with this basename: [id number]    No results found for this basename: INR:1;PROTIME:1 in the last 168 hours  Urinalysis    Component Value Date/Time   COLORURINE LT. YELLOW 11/18/2010 0745   APPEARANCEUR CLEAR 11/18/2010 0745   LABSPEC <=1.005 11/18/2010 0745   PHURINE 7.5 11/18/2010 0745   HGBUR NEGATIVE 11/18/2010 0745   BILIRUBINUR NEGATIVE 11/18/2010 0745   KETONESUR NEGATIVE 11/18/2010 0745   UROBILINOGEN 0.2 11/18/2010 0745   NITRITE NEGATIVE 11/18/2010 0745   LEUKOCYTESUR NEGATIVE 11/18/2010 0745    STUDIES: Ct Chest W Contrast  01/27/2012  *RADIOLOGY REPORT*  Clinical Data: Breast cancer.  Prior chemo and radiation.  CT CHEST WITH CONTRAST  Technique:  Multidetector CT imaging of the chest was performed following the standard protocol during bolus administration of intravenous contrast.  Contrast: 80mL OMNIPAQUE IOHEXOL 300 MG/ML  SOLN  Comparison: 04/18/2010  Findings: Changes of prior bilateral breast implants and right axillary lymph node dissection.  No mediastinal, hilar, or axillary adenopathy.  Heart is normal size. Aorta is normal caliber.  No visible chest wall mass.  Densities noted in the apices bilaterally, right greater than left. This is increased in the right apex since prior study and may be related to radiation fibrosis.  Lungs otherwise clear.  No pleural or pericardial effusion.  Imaging into the upper abdomen shows no acute findings.  No acute or focal bony abnormality.  IMPRESSION: Probable postradiation scarring in the right apex, increased since prior  study.  Postsurgical changes in the right breast and axilla. Bilateral breast implants.  No acute findings.  Original Report Authenticated By: Cyndie Chime, M.D.   Nm Bone Scan Whole Body  01/27/2012  *RADIOLOGY REPORT*  Clinical Data: Breast cancer.  Restaging.  NUCLEAR MEDICINE WHOLE BODY BONE SCINTIGRAPHY  Technique:  Whole body anterior and posterior images were obtained approximately 3 hours after intravenous injection of radiopharmaceutical.  Radiopharmaceutical: CURIE TC-MDP TECHNETIUM TC 68M MEDRONATE IV KIT  Comparison: PET CT 07/25/2009  Findings: No areas of abnormal osseous uptake seen to suggest bony metastatic disease.  Soft tissue activity is normal.  IMPRESSION: No evidence of osseous metastatic disease.  Original Report Authenticated By: Cyndie Chime, M.D.    ASSESSMENT: 61 y.o.  woman status post right breast and axillary lymph node biopsy January 2011, both positive for a clinical T2 N1 invasive ductal carcinoma which was estrogen and progesterone receptor-positive, HER-2/neu-negative with an MIB-1 of 19%.  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  Margins were negative.   (3)  completed radiation therapy in November 2011, then started letrozole but this had to be discontinued April 2012 because of tolerance issues.   (4) on tamoxifen as of May 2012  PLAN: there is no evidence of disease recurrence, now 2 years out from her definitive surgery. She understands that from this point we are not going to do a CAT scan for other scans except to evaluate specific  symptoms. We are going to start seeing her on an every 6 month basis. The plan is to do tamoxifen for 5 years, and then consider switching to an aromatase inhibitor versus continuing on tamoxifen an additional 5 years.  Trivia was very pleased with the results of her scans, and has a good understanding of the overall treatment plan. She knows to call for any problems that may develop before the next visit.   MAGRINAT,GUSTAV C    02/06/2012

## 2012-02-06 ENCOUNTER — Telehealth: Payer: Self-pay | Admitting: Oncology

## 2012-02-06 NOTE — Telephone Encounter (Signed)
lmonvm advising the pt of her feb 2014 appts

## 2012-04-21 ENCOUNTER — Telehealth: Payer: Self-pay

## 2012-04-21 DIAGNOSIS — Z0000 Encounter for general adult medical examination without abnormal findings: Secondary | ICD-10-CM

## 2012-04-21 NOTE — Telephone Encounter (Signed)
Put order in for labs. 

## 2012-04-22 DIAGNOSIS — I7300 Raynaud's syndrome without gangrene: Secondary | ICD-10-CM | POA: Insufficient documentation

## 2012-05-20 ENCOUNTER — Other Ambulatory Visit: Payer: Self-pay | Admitting: Physician Assistant

## 2012-07-05 ENCOUNTER — Other Ambulatory Visit (INDEPENDENT_AMBULATORY_CARE_PROVIDER_SITE_OTHER): Payer: BC Managed Care – PPO

## 2012-07-05 DIAGNOSIS — Z0000 Encounter for general adult medical examination without abnormal findings: Secondary | ICD-10-CM

## 2012-07-05 LAB — LIPID PANEL
HDL: 43.6 mg/dL (ref >39.00)
LDL Cholesterol: 113 mg/dL — ABNORMAL HIGH (ref 0–99)
Total CHOL/HDL Ratio: 4
VLDL: 34.4 mg/dL (ref 0.0–40.0)

## 2012-07-05 LAB — BASIC METABOLIC PANEL
Calcium: 8.7 mg/dL (ref 8.4–10.5)
GFR: 79.4 mL/min (ref >60.00)
Potassium: 4.2 mEq/L (ref 3.5–5.1)
Sodium: 141 mEq/L (ref 135–145)

## 2012-07-05 LAB — CBC WITH DIFFERENTIAL/PLATELET
Basophils Absolute: 0 10*3/uL (ref 0.0–0.1)
HCT: 38.4 % (ref 36.0–46.0)
Lymphs Abs: 0.9 10*3/uL (ref 0.7–4.0)
MCV: 93.8 fl (ref 78.0–100.0)
Monocytes Absolute: 0.3 10*3/uL (ref 0.1–1.0)
Neutrophils Relative %: 58.9 % (ref 43.0–77.0)
Platelets: 166 10*3/uL (ref 150.0–400.0)
RDW: 12.9 % (ref 11.5–14.6)

## 2012-07-05 LAB — HEPATIC FUNCTION PANEL
AST: 23 U/L (ref 0–37)
Albumin: 4 g/dL (ref 3.5–5.2)
Alkaline Phosphatase: 34 U/L — ABNORMAL LOW (ref 39–117)
Total Protein: 6.4 g/dL (ref 6.0–8.3)

## 2012-07-05 LAB — URINALYSIS, ROUTINE W REFLEX MICROSCOPIC
Hgb urine dipstick: NEGATIVE
Ketones, ur: NEGATIVE
Leukocytes, UA: NEGATIVE
Urine Glucose: NEGATIVE
Urobilinogen, UA: 0.2 (ref 0.0–1.0)

## 2012-07-05 LAB — TSH: TSH: 3.4 u[IU]/mL (ref 0.35–5.50)

## 2012-07-12 ENCOUNTER — Encounter: Payer: Self-pay | Admitting: Internal Medicine

## 2012-07-12 ENCOUNTER — Ambulatory Visit (INDEPENDENT_AMBULATORY_CARE_PROVIDER_SITE_OTHER): Payer: BC Managed Care – PPO | Admitting: Internal Medicine

## 2012-07-12 VITALS — BP 110/82 | HR 65 | Temp 98.5°F | Ht 62.0 in | Wt 133.2 lb

## 2012-07-12 DIAGNOSIS — Z2911 Encounter for prophylactic immunotherapy for respiratory syncytial virus (RSV): Secondary | ICD-10-CM

## 2012-07-12 DIAGNOSIS — E785 Hyperlipidemia, unspecified: Secondary | ICD-10-CM

## 2012-07-12 DIAGNOSIS — Z0000 Encounter for general adult medical examination without abnormal findings: Secondary | ICD-10-CM

## 2012-07-12 DIAGNOSIS — Z23 Encounter for immunization: Secondary | ICD-10-CM

## 2012-07-12 NOTE — Progress Notes (Signed)
Subjective:    Patient ID: Jaime Young, female    DOB: [birth year], 62 y.o.   MRN: [id number]  HPI  Here for wellness and f/u;  Overall doing ok;  Pt denies CP, worsening SOB, DOE, wheezing, orthopnea, PND, worsening LE edema, palpitations, dizziness or syncope.  Pt denies neurological change such as new headache, facial or extremity weakness.  Pt denies polydipsia, polyuria, or low sugar symptoms. Pt states overall good compliance with treatment and medications, good tolerability, and has been trying to follow lower cholesterol diet.  Pt denies worsening depressive symptoms, suicidal ideation or panic. No fever, night sweats, wt loss, loss of appetite, or other constitutional symptoms.  Pt states good ability with ADL's, has low fall risk, home safety reviewed and adequate, no other significant changes in hearing or vision, and only occasionally active with exercise.  No acute complaints. Admits to some dietary excess and gained several lbs.  Past Medical History  Diagnosis Date  . Osteopenia 09/18/2010  . Impaired glucose tolerance 11/17/2010  . Hyperlipidemia   . Anxiety   . Allergic rhinitis   . DJD (degenerative joint disease) of lumbar spine   . Diverticulitis   . Diverticulosis of colon   . Migraine   . Raynaud's disease   . DVT (deep venous thrombosis) 1977  . Cancer     stage I right breast cancer  . RADIAL NERVE INJURY   . RAYNAUD'S DISEASE   . SUPERFICIAL PHLEBITIS 09/01/2007   Past Surgical History  Procedure Date  . Total abdominal hysterectomy w/ bilateral salpingoophorectomy 1983    secondary to edometriosis  . Back surgery 2002  . Hemorrhoid surgery 1983  . Tonsillectomy   . Breast surgery 1989/2001    implants  . Breast lumpectomy     axillary node dissection    reports that she has quit smoking. She does not have any smokeless tobacco history on file. She reports that she does not drink alcohol or use illicit drugs. family history includes Bone cancer in her  other; Breast cancer in her other; Cancer in her father and sister; Heart disease in her father; Hypertension in her mother; Stomach cancer in her other; and Stroke in her mother. Allergies  Allergen Reactions  . Ciprofloxacin   . Codeine   . Penicillins   . Sulfonamide Derivatives    Current Outpatient Prescriptions on File Prior to Visit  Medication Sig Dispense Refill  . Calcium Carbonate (CALCIUM 600) 1500 MG TABS Take 1 tablet by mouth daily.        . nepafenac (NEVANAC) 0.1 % ophthalmic suspension Place 1 drop into both eyes daily.       . potassium chloride SA (K-DUR,KLOR-CON) 10 MEQ tablet Take 1 tablet (10 mEq total) by mouth daily.  90 tablet  1  . tamoxifen (NOLVADEX) 20 MG tablet Take 1 tablet (20 mg total) by mouth daily.  90 tablet  3   Review of Systems Constitutional: Negative for diaphoresis, activity change, appetite change or unexpected weight change.  HENT: Negative for hearing loss, ear pain, facial swelling, mouth sores and neck stiffness.   Eyes: Negative for pain, redness and visual disturbance.  Respiratory: Negative for shortness of breath and wheezing.   Cardiovascular: Negative for chest pain and palpitations.  Gastrointestinal: Negative for diarrhea, blood in stool, abdominal distention or other pain Genitourinary: Negative for hematuria, flank pain or change in urine volume.  Musculoskeletal: Negative for myalgias and joint swelling.  Skin: Negative for color change and wound.  Neurological: Negative for syncope and numbness. other than noted Hematological: Negative for adenopathy.  Psychiatric/Behavioral: Negative for hallucinations, self-injury, decreased concentration and agitation.      Objective:   Physical Exam BP 110/82  Pulse 65  Temp 98.5 F (36.9 C) (Oral)  Ht 5\' 2"  (1.575 m)  Wt 133 lb 4 oz (60.442 kg)  BMI 24.37 kg/m2  SpO2 98% VS noted,  Constitutional: Pt is oriented to person, place, and time. Appears well-developed and  well-nourished.  Head: Normocephalic and atraumatic.  Right Ear: External ear normal.  Left Ear: External ear normal.  Nose: Nose normal.  Mouth/Throat: Oropharynx is clear and moist.  Eyes: Conjunctivae and EOM are normal. Pupils are equal, round, and reactive to light.  Neck: Normal range of motion. Neck supple. No JVD present. No tracheal deviation present.  Cardiovascular: Normal rate, regular rhythm, normal heart sounds and intact distal pulses.   Pulmonary/Chest: Effort normal and breath sounds normal.  Abdominal: Soft. Bowel sounds are normal. There is no tenderness. No HSM  Musculoskeletal: Normal range of motion. Exhibits no edema.  Lymphadenopathy:  Has no cervical adenopathy.  Neurological: Pt is alert and oriented to person, place, and time. Pt has normal reflexes. No cranial nerve deficit.  Skin: Skin is warm and dry. No rash noted.  Psychiatric:  Has  normal mood and affect. Behavior is normal.     Assessment & Plan:

## 2012-07-12 NOTE — Assessment & Plan Note (Signed)
ECG reviewed as per emr, for cont'd diet, declines statin

## 2012-07-12 NOTE — Assessment & Plan Note (Signed)

## 2012-07-12 NOTE — Patient Instructions (Addendum)
Your EKG and labs were OK today Please continue your efforts at being more active, low cholesterol diet, and weight control. Please continue all other medications as before, and refills have been done if requested. Please have the pharmacy call with any other refills you may need. You are otherwise up to date with prevention measures today. Please remember to followup with your GYN for the yearly pap smear and/or mammogram as you do You had the shingles shot today Thank you for enrolling in MyChart. Please follow the instructions below to securely access your online medical record. MyChart allows you to send messages to your doctor, view your test results, renew your prescriptions, schedule appointments, and more. To Log into My Chart online, please go by [employer] or [employer] to [email or web address], or download the MyChart App from the [employer] of [employer] .  Your Username is: [email or web address]  (pass [email or web address]) Please send a practice Message on Mychart later today. Please return in 1 year for your yearly visit, or sooner if needed, with Lab testing done 3-5 days before

## 2012-08-02 ENCOUNTER — Other Ambulatory Visit (HOSPITAL_BASED_OUTPATIENT_CLINIC_OR_DEPARTMENT_OTHER): Payer: BC Managed Care – PPO | Admitting: Lab

## 2012-08-02 DIAGNOSIS — Z853 Personal history of malignant neoplasm of breast: Secondary | ICD-10-CM

## 2012-08-02 LAB — COMPREHENSIVE METABOLIC PANEL (CC13)
AST: 21 U/L (ref 5–34)
Alkaline Phosphatase: 39 U/L — ABNORMAL LOW (ref 40–150)
BUN: 12.5 mg/dL (ref 7.0–26.0)
Calcium: 9.3 mg/dL (ref 8.4–10.4)
Creatinine: 0.8 mg/dL (ref 0.6–1.1)

## 2012-08-02 LAB — CBC & DIFF AND RETIC
BASO%: 0.2 % (ref 0.0–2.0)
LYMPH%: 31.2 % (ref 14.0–49.7)
MCHC: 34.3 g/dL (ref 31.5–36.0)
MONO#: 0.2 10*3/uL (ref 0.1–0.9)
NEUT#: 2.8 10*3/uL (ref 1.5–6.5)
RBC: 4.16 10*6/uL (ref 3.70–5.45)
RDW: 12.3 % (ref 11.2–14.5)
Retic %: 1.46 % (ref 0.70–2.10)
Retic Ct Abs: 60.74 10*3/uL (ref 33.70–90.70)
WBC: 4.5 10*3/uL (ref 3.9–10.3)
lymph#: 1.4 10*3/uL (ref 0.9–3.3)

## 2012-08-03 LAB — CANCER ANTIGEN 27.29: CA 27.29: 13 U/mL (ref 0–39)

## 2012-08-09 ENCOUNTER — Ambulatory Visit (HOSPITAL_BASED_OUTPATIENT_CLINIC_OR_DEPARTMENT_OTHER): Payer: BC Managed Care – PPO | Admitting: Oncology

## 2012-08-09 ENCOUNTER — Other Ambulatory Visit: Payer: BC Managed Care – PPO | Admitting: Lab

## 2012-08-09 ENCOUNTER — Telehealth: Payer: Self-pay | Admitting: Oncology

## 2012-08-09 VITALS — BP 129/80 | HR 68 | Temp 98.0°F | Resp 18 | Ht 62.0 in | Wt 134.1 lb

## 2012-08-09 MED ORDER — TAMOXIFEN CITRATE 20 MG PO TABS: 20.0000 mg | ORAL_TABLET | Freq: Every day | ORAL | Status: DC

## 2012-08-09 NOTE — Progress Notes (Signed)
ID: Jaime Young   DOB: 19-Oct-1950  MR#: [id number]  CSN#:[id number]  HISTORY OF PRESENT ILLNESS:  She herself palpated a mass in her right breast, and brought it to Dr. Johnn Hai attention.  He set her up for a screening diagnostic mammography on January 24 of this year.  Dr. Samuella Cota performed the procedure, found no definite mammographic abnormality corresponding to the palpable complaint.  The ultrasound of the right breast did show a mixed echogenic mass at approximately 12 o'clock, extending to the implant capsule, and measuring 2.5 cm.  There was at least one abnormal lymph node in the right axilla, measuring 2.2 cm.    With this information, the patient was recalled for biopsy performed January 31, and this showed ([id number]) an invasive ductal carcinoma with micropapillary features with the lymph node biopsy positive for a very similar looking tumor.  Grade of this tumor is not estimated.  The prognostic panel showed it to be estrogen receptor positive at 56%, progesterone receptor positive at 25%, with a borderline MIB-1 at 19%.  There was no amplification of HER2 by C[facility] with a ratio of 1.50.    With this information, the patient met with Dr. Dwain Sarna, and bilateral breast MRIs were obtained July 18, 2009.  There was an enhancing 2.8 cm mass corresponding to the previously biopsied malignancy, and an enlarged right axillary lymph node measuring 2.1 cm.  The left breast was unremarkable.  Dr. Dwain Sarna discussed these findings with the patient, and suggested neoadjuvant treatment.  Her only option would be a modified radical mastectomy.  He felt it was most reasonable to start with chemotherapy with a view to possibly making lumpectomy and radiation therapy possible.  Her subsequent history is as detailed below.  INTERVAL HISTORY: [name] returns today with her husband Brett Canales for followup of her breast cancer. The interval history is unremarkable. Since her last visit here she has  retired.  REVIEW OF SYSTEMS: She is concerned about discomfort in the right axilla and right lateral breast 62. This is a soreness is fairly constant. It has been their "quite a long time". He doesn't get better or worse and she can't feel anything suspicious in that area. She is tolerating the tamoxifen with no side effects other than vaginal dryness. That is discussed further below. She is walking 30 minutes 4-5 days a week. She is concerned about gaining weight. Otherwise a detailed review of systems today was noncontributory.  PAST MEDICAL HISTORY: Past Medical History  Diagnosis Date  . Osteopenia 09/18/2010  . Impaired glucose tolerance 11/17/2010  . Hyperlipidemia   . Anxiety   . Allergic rhinitis   . DJD (degenerative joint disease) of lumbar spine   . Diverticulitis   . Diverticulosis of colon   . Migraine   . Raynaud's disease   . DVT (deep venous thrombosis) 1977  . Cancer     stage I right breast cancer  . RADIAL NERVE INJURY   . RAYNAUD'S DISEASE   . SUPERFICIAL PHLEBITIS 09/01/2007  The past medical history is significant for endometriosis.  The patient being status post TAH-BSO in 1983.  She underwent a hemorrhoidectomy remotely under [employer].  Of course she has breast implants in place.  These were removed and replaced in 2001 because of contractures.  She is status post tonsillectomy and adenoidectomy.    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 Eustaquio Maize.  She has also had left eye cataract surgery.  She has a history of migraines, a history of mild hypercholesterolemia, and a history of osteopenia.   PAST SURGICAL HISTORY: Past Surgical History  Procedure Laterality Date  . Total abdominal hysterectomy w/ bilateral salpingoophorectomy  1983    secondary to edometriosis  . Young surgery  2002  . Hemorrhoid surgery  1983   . Tonsillectomy    . Breast surgery  1989/2001    implants  . Breast lumpectomy      axillary node dissection    FAMILY HISTORY Family History  Problem Relation Age of Onset  . Stroke Mother   . Heart disease Father   . Cancer Father   . Cancer Sister   . Hypertension Mother   . Breast cancer Other   . Bone cancer Other   . Stomach cancer Other   The patient's  father died at the age of 57 from what sounds like a gastroesophageal junction tumor.  The patient's  mother died at the age of 73 after multiple strokes.  The patient has five brothers and three sisters, all of her siblings are half-siblings.  The problems are all on her father's side.  The sister, [name], had breast and ovarian cancer.  She is deceased, and was never tested for BRCA1 and 2.  Second sister had throat cancer.  There is no other history of cancer in the immediate family to her knowledge.    GYNECOLOGIC HISTORY: She is GXP1.  First pregnancy to term age 62.  She took estrogen replacement between 1983 and 2010.  Just went off the Climara patch.  She has not noticed significant problems with hot flashes so far, but does feel a lot more emotional she says, and it is possible that the hormone withdrawal may be contributing to that, although her current situation is quite emotional.   SOCIAL HISTORY: She worked for the [employer] here in town but has retired.  Her husband, Brett Canales, works for [employer] in the Teaching laboratory technician.  Daughter, [name], is a stay-at-home mom. [name] has 3 grandchildren.  The patient attends a [employer].     ADVANCED DIRECTIVES: Not in place  HEALTH MAINTENANCE: History  Substance Use Topics  . Smoking status: Former Games developer  . Smokeless tobacco: Not on file  . Alcohol Use: No     Colonoscopy:  PAP:  Bone density:  Lipid panel:  Allergies  Allergen Reactions  . Ciprofloxacin   . Codeine   . Penicillins   . Sulfonamide Derivatives     Current Outpatient  Prescriptions  Medication Sig Dispense Refill  . Calcium Carbonate (CALCIUM 600) 1500 MG TABS Take 1 tablet by mouth daily.        . Multiple Vitamin (MULTIVITAMIN) tablet Take 1 tablet by mouth daily.      . nepafenac (NEVANAC) 0.1 % ophthalmic suspension Place 1 drop into both eyes daily.       . potassium chloride SA (K-DUR,KLOR-CON) 10 MEQ tablet Take 1 tablet (10 mEq total) by mouth daily.  90 tablet  1  . tamoxifen (NOLVADEX) 20 MG tablet Take 1 tablet (20 mg total) by mouth daily.  90 tablet  3   No current facility-administered medications for this visit.    OBJECTIVE: middle-aged white woman who appears well  Filed Vitals:   08/09/12 1526  BP: 129/80  Pulse: 68  Temp: 98 F (36.7 C)  Resp: 18    Sclerae unicteric  Oropharynx clear No cervical or supraclavicular adenopathy Lungs no rales or rhonchi Heart regular rate and rhythm Abd benign MSK no focal spinal tenderness, no peripheral edema Neuro: nonfocal, well oriented, positive affect Breasts: the right breast is status post lumpectomy and radiation; there is no evidence of disease recurrence. The right axilla is entirely benign. ER no masses, swelling, or erythema. The left breast is unremarkable.  LAB RESULTS: Lab Results  Component Value Date   WBC 4.5 08/02/2012   NEUTROABS 2.8 08/02/2012   HGB 13.3 08/02/2012   HCT 38.8 08/02/2012   MCV 93.3 08/02/2012   PLT 159 08/02/2012      Chemistry      Component Value Date/Time   NA 144 08/02/2012 1513   NA 141 07/05/2012 0740   NA 142 01/27/2012 0942   K 3.6 08/02/2012 1513   K 4.2 07/05/2012 0740   K 4.0 01/27/2012 0942   CL 105 08/02/2012 1513   CL 103 07/05/2012 0740   CL 102 01/27/2012 0942   CO2 32* 08/02/2012 1513   CO2 31 07/05/2012 0740   CO2 30 01/27/2012 0942   BUN 12.5 08/02/2012 1513   BUN 12 07/05/2012 0740   BUN 12 01/27/2012 0942   CREATININE 0.8 08/02/2012 1513   CREATININE 0.8 07/05/2012 0740   CREATININE 1.0 01/27/2012 0942      Component Value  Date/Time   CALCIUM 9.3 08/02/2012 1513   CALCIUM 8.7 07/05/2012 0740   CALCIUM 9.2 01/27/2012 0942   ALKPHOS 39* 08/02/2012 1513   ALKPHOS 34* 07/05/2012 0740   ALKPHOS 41 01/27/2012 0942   AST 21 08/02/2012 1513   AST 23 07/05/2012 0740   AST 27 01/27/2012 0942   ALT 17 08/02/2012 1513   ALT 16 07/05/2012 0740   BILITOT 0.31 08/02/2012 1513   BILITOT 0.4 07/05/2012 0740   BILITOT 0.50 01/27/2012 0942       Lab Results  Component Value Date   LABCA2 13 08/02/2012    No components found with this basename: [id number]    No results found for this basename: INR,  in the last 168 hours  Urinalysis    Component Value Date/Time   COLORURINE LT. YELLOW 07/05/2012 0740   APPEARANCEUR CLEAR 07/05/2012 0740   LABSPEC 1.010 07/05/2012 0740   PHURINE 8.0 07/05/2012 0740   HGBUR NEGATIVE 07/05/2012 0740   BILIRUBINUR NEGATIVE 07/05/2012 0740   KETONESUR NEGATIVE 07/05/2012 0740   UROBILINOGEN 0.2 07/05/2012 0740   NITRITE NEGATIVE 07/05/2012 0740   LEUKOCYTESUR NEGATIVE 07/05/2012 0740    STUDIES: No results found. Mammography is scheduled for the first week of March 2014   ASSESSMENT: 62 y.o. [place] woman status post right breast and axillary lymph node biopsy January 2011, both positive for a clinical T2 N1 invasive ductal carcinoma which was estrogen and progesterone receptor-positive, HER-2/neu-negative with an MIB-1 of 19%.  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  Margins were negative.   (3)  completed radiation therapy in November 2011, then started letrozole but this had to be  discontinued April 2012 because of tolerance issues.   (4) on tamoxifen as of May 2012  PLAN: The plan  is to continue tamoxifen for a minimum of 5 years, more likely 14 and. While on tamoxifen, Sidda is welcome to use local estrogen preparations such as Vagifem or Estring to treat the vaginal dryness she is experiencing. She is going to discuss this with her gynecologist at the next visit, which is shortly coming up.  Otherwise she will see [place] again in one year. She knows to call for any problems that may develop before that visit.  Chrysta Fulcher C    08/09/2012

## 2012-08-09 NOTE — Telephone Encounter (Signed)
gv pt appt schedule for March 2015.  °

## 2012-08-12 ENCOUNTER — Other Ambulatory Visit: Payer: Self-pay | Admitting: Gynecology

## 2012-08-19 ENCOUNTER — Other Ambulatory Visit: Payer: Self-pay | Admitting: Physician Assistant

## 2012-11-05 ENCOUNTER — Other Ambulatory Visit: Payer: Self-pay | Admitting: Dermatology

## 2012-11-18 ENCOUNTER — Other Ambulatory Visit: Payer: Self-pay | Admitting: Physician Assistant

## 2012-11-18 ENCOUNTER — Other Ambulatory Visit: Payer: Self-pay | Admitting: Dermatology

## 2013-01-12 DIAGNOSIS — H1130 Conjunctival hemorrhage, unspecified eye: Secondary | ICD-10-CM | POA: Insufficient documentation

## 2013-02-11 ENCOUNTER — Other Ambulatory Visit: Payer: Self-pay | Admitting: Physician Assistant

## 2013-02-23 ENCOUNTER — Other Ambulatory Visit: Payer: Self-pay | Admitting: Dermatology

## 2013-03-05 ENCOUNTER — Ambulatory Visit (INDEPENDENT_AMBULATORY_CARE_PROVIDER_SITE_OTHER): Payer: BC Managed Care – PPO | Admitting: Family Medicine

## 2013-03-05 VITALS — BP 114/72 | HR 91 | Temp 98.1°F | Resp 17 | Ht 63.0 in | Wt 138.0 lb

## 2013-03-05 DIAGNOSIS — M7710 Lateral epicondylitis, unspecified elbow: Secondary | ICD-10-CM

## 2013-03-05 DIAGNOSIS — M25521 Pain in right elbow: Secondary | ICD-10-CM

## 2013-03-05 DIAGNOSIS — M25529 Pain in unspecified elbow: Secondary | ICD-10-CM

## 2013-03-05 DIAGNOSIS — M7711 Lateral epicondylitis, right elbow: Secondary | ICD-10-CM

## 2013-03-05 NOTE — Progress Notes (Signed)
Subjective:    Patient ID: Jaime Young, female    DOB: 25-Jan-1951, 62 y.o.   MRN: [id number]  HPI Jaime Young is a 62 y.o. female PCP: Oliver Barre, MD  C/o R elbow pain - sore to point of waking up at night. On outside of elbow. Noted about 3-4 weeks ago. Hurts to lift with R arm.  Burning/sharp pain.Works in yard regularly, but unknown if any increase or change in activity. NKI. R hand dominant.   Tx: Salonpas, Aspercreme. Ibuprofen 2 times per day.   Hx of R axillary LAD for breast cancer in 2011.    Past Medical History  Diagnosis Date  . Osteopenia 09/18/2010  . Impaired glucose tolerance 11/17/2010  . Hyperlipidemia   . Anxiety   . Allergic rhinitis   . DJD (degenerative joint disease) of lumbar spine   . Diverticulitis   . Diverticulosis of colon   . Migraine   . Raynaud's disease   . DVT (deep venous thrombosis) 1977  . Cancer     stage I right breast cancer  . RADIAL NERVE INJURY   . RAYNAUD'S DISEASE   . SUPERFICIAL PHLEBITIS 09/01/2007   Past Surgical History  Procedure Laterality Date  . Total abdominal hysterectomy w/ bilateral salpingoophorectomy  1983    secondary to edometriosis  . Back surgery  2002  . Hemorrhoid surgery  1983  . Tonsillectomy    . Breast surgery  1989/2001    implants  . Breast lumpectomy      axillary node dissection   Allergies  Allergen Reactions  . Ciprofloxacin   . Codeine   . Penicillins   . Sulfonamide Derivatives    Prior to Admission medications   Medication Sig Start Date End Date Taking? Authorizing Provider  ibuprofen (ADVIL,MOTRIN) 200 MG tablet Take 200 mg by mouth every 6 (six) hours as needed for pain.   Yes Historical Provider, MD   History   Social History  . Marital Status: Married    Spouse Name: N/A    Number of Children: 1  . Years of Education: N/A   Occupational History  . ACCOUNT EXECUTIVE   . Systems developer    Social History Main Topics  . Smoking status: Former Games developer  . Smokeless  tobacco: Not on file  . Alcohol Use: No  . Drug Use: No  . Sexual Activity: Not on file   Other Topics Concern  . Not on file   Social History Narrative  . No narrative on file   Review of Systems  Musculoskeletal: Positive for arthralgias.  Skin: Negative for rash.  Neurological: Negative for weakness.       Objective:   Physical Exam  Vitals reviewed. Constitutional: She is oriented to person, place, and time. She appears well-developed and well-nourished. No distress.  Pulmonary/Chest: Effort normal.  Musculoskeletal:       Right elbow: She exhibits normal range of motion, no swelling and no effusion. Tenderness found. Lateral epicondyle (ttp over ECRB to lat epicondyle. pain repruced with resisted wrist extension, lessened with counterforrce to ecrb distally. ) tenderness noted. No radial head, no medial epicondyle and no olecranon process tenderness noted.  Neurological: She is alert and oriented to person, place, and time.  Skin: Skin is warm. No rash noted.  Psychiatric: She has a normal mood and affect. Her behavior is normal.   nvi distally.  Wrist and elbow nt, forearm nt, no axillary or epitrochlear LAD.     Assessment &  Plan:  Jaime Young is a 62 y.o. female Elbow pain, right, Lateral epicondylitis (tennis elbow), right. Typical exam/sx's.  Improved with counterforce pressure. Discussed condition and treatment, typical course, and trigger avoidance. Ice massage, otc counterforce brace discussed, pictures shown and how to apply. HEP as below, rtc precautions.  Patient Instructions  counterforce brace for elbow as discussed. Ice massage, other treatments as below. Avoid carrying and other repetitive activities with affected arm as discussed.   If not improving in next 2-3 weeks, can recheck for other treatment options. Return to the clinic or go to the nearest emergency room if any of your symptoms worsen or new symptoms occur. Lateral Epicondylitis (Tennis  Elbow) with Rehab Lateral epicondylitis involves inflammation and pain around the outer portion of the elbow. The pain is caused by inflammation of the tendons in the forearm that bring back (extend) the wrist. Lateral epicondylittis is also called tennis elbow, because it is very common in tennis players. However, it may occur in any individual who extends the wrist repetitively. If lateral epicondylitis is left untreated, it may become a chronic problem. SYMPTOMS   Pain, tenderness, and inflammation on the outer (lateral) side of the elbow.  Pain or weakness with gripping activities.  Pain that increases with wrist twisting motions (playing tennis, using a screwdriver, opening a door or a jar).  Pain with lifting objects, including a coffee cup. CAUSES  Lateral epicondylitis is caused by inflammation of the tendons that extend the wrist. Causes of injury may include:  Repetitive stress and strain on the muscles and tendons that extend the wrist.  Sudden change in activity level or intensity.  Incorrect grip in racquet sports.  Incorrect grip size of racquet (often too large).  Incorrect hitting position or technique (usually backhand, leading with the elbow).  Using a racket that is too heavy. RISK INCREASES WITH:  Sports or occupations that require repetitive and/or strenuous forearm and wrist movements (tennis, squash, racquetball, carpentry).  Poor wrist and forearm strength and flexibility.  Failure to warm up properly before activity.  Resuming activity before healing, rehabilitation, and conditioning are complete. PREVENTION   Warm up and stretch properly before activity.  Maintain physical fitness:  Strength, flexibility, and endurance.  Cardiovascular fitness.  Wear and use properly fitted equipment.  Learn and use proper technique and have a coach correct improper technique.  Wear a tennis elbow (counterforce) brace. PROGNOSIS  The course of this  condition depends on the degree of the injury. If treated properly, acute cases (symptoms lasting less than 4 weeks) are often resolved in 2 to 6 weeks. Chronic (longer lasting cases) often resolve in 3 to 6 months, but may require physical therapy. RELATED COMPLICATIONS   Frequently recurring symptoms, resulting in a chronic problem. Properly treating the problem the first time decreases frequency of recurrence.  Chronic inflammation, scarring tendon degeneration, and partial tendon tear, requiring surgery.  Delayed healing or resolution of symptoms. TREATMENT  Treatment first involves the use of ice and medicine, to reduce pain and inflammation. Strengthening and stretching exercises may help reduce discomfort, if performed regularly. These exercises may be performed at home, if the condition is an acute injury. Chronic cases may require a referral to a physical therapist for evaluation and treatment. Your caregiver may advise a corticosteroid injection, to help reduce inflammation. Rarely, surgery is needed. MEDICATION  If pain medicine is needed, nonsteroidal anti-inflammatory medicines (aspirin and ibuprofen), or other minor pain relievers (acetaminophen), are often advised.  Do not  take pain medicine for 7 days before surgery.  Prescription pain relievers may be given, if your caregiver thinks they are needed. Use only as directed and only as much as you need.  Corticosteroid injections may be recommended. These injections should be reserved only for the most severe cases, because they can only be given a certain number of times. HEAT AND COLD  Cold treatment (icing) should be applied for 10 to 15 minutes every 2 to 3 hours for inflammation and pain, and immediately after activity that aggravates your symptoms. Use ice packs or an ice massage.  Heat treatment may be used before performing stretching and strengthening activities prescribed by your caregiver, physical therapist, or  athletic trainer. Use a heat pack or a warm water soak. SEEK MEDICAL CARE IF: Symptoms get worse or do not improve in 2 weeks, despite treatment. EXERCISES  RANGE OF MOTION (ROM) AND STRETCHING EXERCISES - Epicondylitis, Lateral (Tennis Elbow) These exercises may help you when beginning to rehabilitate your injury. Your symptoms may go away with or without further involvement from your physician, physical therapist or athletic trainer. While completing these exercises, remember:   Restoring tissue flexibility helps normal motion to return to the joints. This allows healthier, less painful movement and activity.  An effective stretch should be held for at least 30 seconds.  A stretch should never be painful. You should only feel a gentle lengthening or release in the stretched tissue. RANGE OF MOTION  Wrist Flexion, Active-Assisted  Extend your right / left elbow with your fingers pointing down.*  Gently pull the back of your hand towards you, until you feel a gentle stretch on the top of your forearm.  Hold this position for __________ seconds. Repeat __________ times. Complete this exercise __________ times per day.  *If directed by your physician, physical therapist or athletic trainer, complete this stretch with your elbow bent, rather than extended. RANGE OF MOTION  Wrist Extension, Active-Assisted  Extend your right / left elbow and turn your palm upwards.*  Gently pull your palm and fingertips back, so your wrist extends and your fingers point more toward the ground.  You should feel a gentle stretch on the inside of your forearm.  Hold this position for __________ seconds. Repeat __________ times. Complete this exercise __________ times per day. *If directed by your physician, physical therapist or athletic trainer, complete this stretch with your elbow bent, rather than extended. STRETCH - Wrist Flexion  Place the back of your right / left hand on a tabletop, leaving your  elbow slightly bent. Your fingers should point away from your body.  Gently press the back of your hand down onto the table by straightening your elbow. You should feel a stretch on the top of your forearm.  Hold this position for __________ seconds. Repeat __________ times. Complete this stretch __________ times per day.  STRETCH  Wrist Extension   Place your right / left fingertips on a tabletop, leaving your elbow slightly bent. Your fingers should point backwards.  Gently press your fingers and palm down onto the table by straightening your elbow. You should feel a stretch on the inside of your forearm.  Hold this position for __________ seconds. Repeat __________ times. Complete this stretch __________ times per day.  STRENGTHENING EXERCISES - Epicondylitis, Lateral (Tennis Elbow) These exercises may help you when beginning to rehabilitate your injury. They may resolve your symptoms with or without further involvement from your physician, physical therapist or athletic trainer. While completing these  exercises, remember:   Muscles can gain both the endurance and the strength needed for everyday activities through controlled exercises.  Complete these exercises as instructed by your physician, physical therapist or athletic trainer. Increase the resistance and repetitions only as guided.  You may experience muscle soreness or fatigue, but the pain or discomfort you are trying to eliminate should never worsen during these exercises. If this pain does get worse, stop and make sure you are following the directions exactly. If the pain is still present after adjustments, discontinue the exercise until you can discuss the trouble with your caregiver. STRENGTH Wrist Flexors  Sit with your right / left forearm palm-up and fully supported on a table or countertop. Your elbow should be resting below the height of your shoulder. Allow your wrist to extend over the edge of the surface.  Loosely  holding a __________ weight, or a piece of rubber exercise band or tubing, slowly curl your hand up toward your forearm.  Hold this position for __________ seconds. Slowly lower the wrist back to the starting position in a controlled manner. Repeat __________ times. Complete this exercise __________ times per day.  STRENGTH  Wrist Extensors  Sit with your right / left forearm palm-down and fully supported on a table or countertop. Your elbow should be resting below the height of your shoulder. Allow your wrist to extend over the edge of the surface.  Loosely holding a __________ weight, or a piece of rubber exercise band or tubing, slowly curl your hand up toward your forearm.  Hold this position for __________ seconds. Slowly lower the wrist back to the starting position in a controlled manner. Repeat __________ times. Complete this exercise __________ times per day.  STRENGTH - Ulnar Deviators  Stand with a ____________________ weight in your right / left hand, or sit while holding a rubber exercise band or tubing, with your healthy arm supported on a table or countertop.  Move your wrist, so that your pinkie travels toward your forearm and your thumb moves away from your forearm.  Hold this position for __________ seconds and then slowly lower the wrist back to the starting position. Repeat __________ times. Complete this exercise __________ times per day STRENGTH - Radial Deviators  Stand with a ____________________ weight in your right / left hand, or sit while holding a rubber exercise band or tubing, with your injured arm supported on a table or countertop.  Raise your hand upward in front of you or pull up on the rubber tubing.  Hold this position for __________ seconds and then slowly lower the wrist back to the starting position. Repeat __________ times. Complete this exercise __________ times per day. STRENGTH  Forearm Supinators   Sit with your right / left forearm supported  on a table, keeping your elbow below shoulder height. Rest your hand over the edge, palm down.  Gently grip a hammer or a soup ladle.  Without moving your elbow, slowly turn your palm and hand upward to a "thumbs-up" position.  Hold this position for __________ seconds. Slowly return to the starting position. Repeat __________ times. Complete this exercise __________ times per day.  STRENGTH  Forearm Pronators   Sit with your right / left forearm supported on a table, keeping your elbow below shoulder height. Rest your hand over the edge, palm up.  Gently grip a hammer or a soup ladle.  Without moving your elbow, slowly turn your palm and hand upward to a "thumbs-up" position.  Hold this  position for __________ seconds. Slowly return to the starting position. Repeat __________ times. Complete this exercise __________ times per day.  STRENGTH - Grip  Grasp a tennis ball, a dense sponge, or a large, rolled sock in your hand.  Squeeze as hard as you can, without increasing any pain.  Hold this position for __________ seconds. Release your grip slowly. Repeat __________ times. Complete this exercise __________ times per day.  STRENGTH - Elbow Extensors, Isometric  Stand or sit upright, on a firm surface. Place your right / left arm so that your palm faces your stomach, and it is at the height of your waist.  Place your opposite hand on the underside of your forearm. Gently push up as your right / left arm resists. Push as hard as you can with both arms, without causing any pain or movement at your right / left elbow. Hold this stationary position for __________ seconds. Gradually release the tension in both arms. Allow your muscles to relax completely before repeating. Document Released: 06/02/2005 Document Revised: 08/25/2011 Document Reviewed: 09/14/2008 [facility] Patient Information 2014 [place], [place].

## 2013-03-05 NOTE — Patient Instructions (Signed)
counterforce brace for elbow as discussed. Ice massage, other treatments as below. Avoid carrying and other repetitive activities with affected arm as discussed.   If not improving in next 2-3 weeks, can recheck for other treatment options. Return to the clinic or go to the nearest emergency room if any of your symptoms worsen or new symptoms occur. Lateral Epicondylitis (Tennis Elbow) with Rehab Lateral epicondylitis involves inflammation and pain around the outer portion of the elbow. The pain is caused by inflammation of the tendons in the forearm that bring back (extend) the wrist. Lateral epicondylittis is also called tennis elbow, because it is very common in tennis players. However, it may occur in any individual who extends the wrist repetitively. If lateral epicondylitis is left untreated, it may become a chronic problem. SYMPTOMS   Pain, tenderness, and inflammation on the outer (lateral) side of the elbow.  Pain or weakness with gripping activities.  Pain that increases with wrist twisting motions (playing tennis, using a screwdriver, opening a door or a jar).  Pain with lifting objects, including a coffee cup. CAUSES  Lateral epicondylitis is caused by inflammation of the tendons that extend the wrist. Causes of injury may include:  Repetitive stress and strain on the muscles and tendons that extend the wrist.  Sudden change in activity level or intensity.  Incorrect grip in racquet sports.  Incorrect grip size of racquet (often too large).  Incorrect hitting position or technique (usually backhand, leading with the elbow).  Using a racket that is too heavy. RISK INCREASES WITH:  Sports or occupations that require repetitive and/or strenuous forearm and wrist movements (tennis, squash, racquetball, carpentry).  Poor wrist and forearm strength and flexibility.  Failure to warm up properly before activity.  Resuming activity before healing, rehabilitation, and  conditioning are complete. PREVENTION   Warm up and stretch properly before activity.  Maintain physical fitness:  Strength, flexibility, and endurance.  Cardiovascular fitness.  Wear and use properly fitted equipment.  Learn and use proper technique and have a coach correct improper technique.  Wear a tennis elbow (counterforce) brace. PROGNOSIS  The course of this condition depends on the degree of the injury. If treated properly, acute cases (symptoms lasting less than 4 weeks) are often resolved in 2 to 6 weeks. Chronic (longer lasting cases) often resolve in 3 to 6 months, but may require physical therapy. RELATED COMPLICATIONS   Frequently recurring symptoms, resulting in a chronic problem. Properly treating the problem the first time decreases frequency of recurrence.  Chronic inflammation, scarring tendon degeneration, and partial tendon tear, requiring surgery.  Delayed healing or resolution of symptoms. TREATMENT  Treatment first involves the use of ice and medicine, to reduce pain and inflammation. Strengthening and stretching exercises may help reduce discomfort, if performed regularly. These exercises may be performed at home, if the condition is an acute injury. Chronic cases may require a referral to a physical therapist for evaluation and treatment. Your caregiver may advise a corticosteroid injection, to help reduce inflammation. Rarely, surgery is needed. MEDICATION  If pain medicine is needed, nonsteroidal anti-inflammatory medicines (aspirin and ibuprofen), or other minor pain relievers (acetaminophen), are often advised.  Do not take pain medicine for 7 days before surgery.  Prescription pain relievers may be given, if your caregiver thinks they are needed. Use only as directed and only as much as you need.  Corticosteroid injections may be recommended. These injections should be reserved only for the most severe cases, because they can only be given  a certain  number of times. HEAT AND COLD  Cold treatment (icing) should be applied for 10 to 15 minutes every 2 to 3 hours for inflammation and pain, and immediately after activity that aggravates your symptoms. Use ice packs or an ice massage.  Heat treatment may be used before performing stretching and strengthening activities prescribed by your caregiver, physical therapist, or athletic trainer. Use a heat pack or a warm water soak. SEEK MEDICAL CARE IF: Symptoms get worse or do not improve in 2 weeks, despite treatment. EXERCISES  RANGE OF MOTION (ROM) AND STRETCHING EXERCISES - Epicondylitis, Lateral (Tennis Elbow) These exercises may help you when beginning to rehabilitate your injury. Your symptoms may go away with or without further involvement from your physician, physical therapist or athletic trainer. While completing these exercises, remember:   Restoring tissue flexibility helps normal motion to return to the joints. This allows healthier, less painful movement and activity.  An effective stretch should be held for at least 30 seconds.  A stretch should never be painful. You should only feel a gentle lengthening or release in the stretched tissue. RANGE OF MOTION  Wrist Flexion, Active-Assisted  Extend your right / left elbow with your fingers pointing down.*  Gently pull the back of your hand towards you, until you feel a gentle stretch on the top of your forearm.  Hold this position for __________ seconds. Repeat __________ times. Complete this exercise __________ times per day.  *If directed by your physician, physical therapist or athletic trainer, complete this stretch with your elbow bent, rather than extended. RANGE OF MOTION  Wrist Extension, Active-Assisted  Extend your right / left elbow and turn your palm upwards.*  Gently pull your palm and fingertips back, so your wrist extends and your fingers point more toward the ground.  You should feel a gentle stretch on the  inside of your forearm.  Hold this position for __________ seconds. Repeat __________ times. Complete this exercise __________ times per day. *If directed by your physician, physical therapist or athletic trainer, complete this stretch with your elbow bent, rather than extended. STRETCH - Wrist Flexion  Place the back of your right / left hand on a tabletop, leaving your elbow slightly bent. Your fingers should point away from your body.  Gently press the back of your hand down onto the table by straightening your elbow. You should feel a stretch on the top of your forearm.  Hold this position for __________ seconds. Repeat __________ times. Complete this stretch __________ times per day.  STRETCH  Wrist Extension   Place your right / left fingertips on a tabletop, leaving your elbow slightly bent. Your fingers should point backwards.  Gently press your fingers and palm down onto the table by straightening your elbow. You should feel a stretch on the inside of your forearm.  Hold this position for __________ seconds. Repeat __________ times. Complete this stretch __________ times per day.  STRENGTHENING EXERCISES - Epicondylitis, Lateral (Tennis Elbow) These exercises may help you when beginning to rehabilitate your injury. They may resolve your symptoms with or without further involvement from your physician, physical therapist or athletic trainer. While completing these exercises, remember:   Muscles can gain both the endurance and the strength needed for everyday activities through controlled exercises.  Complete these exercises as instructed by your physician, physical therapist or athletic trainer. Increase the resistance and repetitions only as guided.  You may experience muscle soreness or fatigue, but the pain or discomfort you  are trying to eliminate should never worsen during these exercises. If this pain does get worse, stop and make sure you are following the directions  exactly. If the pain is still present after adjustments, discontinue the exercise until you can discuss the trouble with your caregiver. STRENGTH Wrist Flexors  Sit with your right / left forearm palm-up and fully supported on a table or countertop. Your elbow should be resting below the height of your shoulder. Allow your wrist to extend over the edge of the surface.  Loosely holding a __________ weight, or a piece of rubber exercise band or tubing, slowly curl your hand up toward your forearm.  Hold this position for __________ seconds. Slowly lower the wrist back to the starting position in a controlled manner. Repeat __________ times. Complete this exercise __________ times per day.  STRENGTH  Wrist Extensors  Sit with your right / left forearm palm-down and fully supported on a table or countertop. Your elbow should be resting below the height of your shoulder. Allow your wrist to extend over the edge of the surface.  Loosely holding a __________ weight, or a piece of rubber exercise band or tubing, slowly curl your hand up toward your forearm.  Hold this position for __________ seconds. Slowly lower the wrist back to the starting position in a controlled manner. Repeat __________ times. Complete this exercise __________ times per day.  STRENGTH - Ulnar Deviators  Stand with a ____________________ weight in your right / left hand, or sit while holding a rubber exercise band or tubing, with your healthy arm supported on a table or countertop.  Move your wrist, so that your pinkie travels toward your forearm and your thumb moves away from your forearm.  Hold this position for __________ seconds and then slowly lower the wrist back to the starting position. Repeat __________ times. Complete this exercise __________ times per day STRENGTH - Radial Deviators  Stand with a ____________________ weight in your right / left hand, or sit while holding a rubber exercise band or tubing, with your  injured arm supported on a table or countertop.  Raise your hand upward in front of you or pull up on the rubber tubing.  Hold this position for __________ seconds and then slowly lower the wrist back to the starting position. Repeat __________ times. Complete this exercise __________ times per day. STRENGTH  Forearm Supinators   Sit with your right / left forearm supported on a table, keeping your elbow below shoulder height. Rest your hand over the edge, palm down.  Gently grip a hammer or a soup ladle.  Without moving your elbow, slowly turn your palm and hand upward to a "thumbs-up" position.  Hold this position for __________ seconds. Slowly return to the starting position. Repeat __________ times. Complete this exercise __________ times per day.  STRENGTH  Forearm Pronators   Sit with your right / left forearm supported on a table, keeping your elbow below shoulder height. Rest your hand over the edge, palm up.  Gently grip a hammer or a soup ladle.  Without moving your elbow, slowly turn your palm and hand upward to a "thumbs-up" position.  Hold this position for __________ seconds. Slowly return to the starting position. Repeat __________ times. Complete this exercise __________ times per day.  STRENGTH - Grip  Grasp a tennis ball, a dense sponge, or a large, rolled sock in your hand.  Squeeze as hard as you can, without increasing any pain.  Hold this position for  __________ seconds. Release your grip slowly. Repeat __________ times. Complete this exercise __________ times per day.  STRENGTH - Elbow Extensors, Isometric  Stand or sit upright, on a firm surface. Place your right / left arm so that your palm faces your stomach, and it is at the height of your waist.  Place your opposite hand on the underside of your forearm. Gently push up as your right / left arm resists. Push as hard as you can with both arms, without causing any pain or movement at your right / left  elbow. Hold this stationary position for __________ seconds. Gradually release the tension in both arms. Allow your muscles to relax completely before repeating. Document Released: 06/02/2005 Document Revised: 08/25/2011 Document Reviewed: 09/14/2008 [facility] Patient Information 2014 [place], [place].

## 2013-04-21 ENCOUNTER — Other Ambulatory Visit: Payer: Self-pay

## 2013-05-11 ENCOUNTER — Other Ambulatory Visit: Payer: Self-pay | Admitting: Physician Assistant

## 2013-05-11 DIAGNOSIS — E876 Hypokalemia: Secondary | ICD-10-CM

## 2013-06-07 ENCOUNTER — Ambulatory Visit (INDEPENDENT_AMBULATORY_CARE_PROVIDER_SITE_OTHER): Payer: BC Managed Care – PPO | Admitting: Physician Assistant

## 2013-06-07 VITALS — BP 122/70 | HR 92 | Temp 99.0°F | Resp 16 | Ht 63.0 in | Wt 135.0 lb

## 2013-06-07 DIAGNOSIS — R05 Cough: Secondary | ICD-10-CM

## 2013-06-07 MED ORDER — HYDROCOD POLST-CHLORPHEN POLST 10-8 MG/5ML PO LQCR: 5.0000 mL | Freq: Two times a day (BID) | ORAL | Status: AC

## 2013-06-07 MED ORDER — PREDNISONE 10 MG PO TABS: ORAL_TABLET | ORAL | Status: AC

## 2013-06-07 NOTE — Progress Notes (Signed)
   Subjective:    Patient ID: Jaime Young, female    DOB: 10/17/1950, 62 y.o.   MRN: [id number]  HPI  Pt presents to clinic with 2 day h/o dry cough and concerns regarding her throat.  She has a small amount of congestion but no rhinorrhea and no HA or myalgias.  She has not been using any OTC cold preps. She has been using some tylenol.  She has h/o after use of OTC cold preps feeling of throat closing up and she has been to the ER once and hospitalized in the ICU within the past 2 years for symptoms that started similar to what she is having now.  The cough at that time was a barking cough.    When she was hospitalized she has to be intubated but her ER visit they were able to use neb and prednisone.  She is scared because she is having trouble with deep breaths because she is coughing all the time.  She feels so dry.   She has h/o breast cancer that was treated with radiation and chemo - she had radiation into her lymph system and lymph nodes resection.  She does not have problems with heartburn or indigestion.   Review of Systems     Objective:   Physical Exam  Vitals reviewed. Constitutional: She is oriented to person, place, and time. She appears well-developed and well-nourished.  Pt has trouble talking because of a dry cough.  HENT:  Head: Normocephalic and atraumatic.  Right Ear: Hearing, tympanic membrane, external ear and ear canal normal.  Left Ear: Hearing, tympanic membrane, external ear and ear canal normal.  Nose: Nose normal.  Mouth/Throat: Uvula is midline, oropharynx is clear and moist and mucous membranes are normal.  Eyes: Conjunctivae are normal.  Neck: Normal range of motion.  Cardiovascular: Normal rate, regular rhythm and normal heart sounds.   No murmur heard. Pulmonary/Chest: Effort normal and breath sounds normal. She has no wheezes.  She has trouble taking deep breaths due to her cough but she has good air flow in her lungs.  Lymphadenopathy:    She has  no cervical adenopathy.  Neurological: She is alert and oriented to person, place, and time.  Skin: Skin is warm and dry.  Psychiatric: She has a normal mood and affect. Her behavior is normal. Judgment and thought content normal.       Assessment & Plan:  Cough - Plan: predniSONE (DELTASONE) 10 MG tablet, chlorpheniramine-HYDROcodone (TUSSIONEX PENNKINETIC ER) 10-8 MG/5ML LQCR  Spent 30 min with patient discussing possible causes.  I have to question whether patient may be having trouble from her h/o radiation and dry throat and therefore when she has PND it gets thick giving her the sensation of something stuck in her throat and then she get an inflammatory cough that when she starts to cough it becomes so regular she starts to have trouble breathing.  Because of her history of hospitalization with similar symptoms will treat with cough meds and prednisone to help with inflammation.  She will RTC if she is not improving.  She will use the humidifier at home to help with dryness in there home and throat.  She will increase her fluids.  Benny Lennert PA-C 06/07/2013 11:12 AM

## 2013-06-10 ENCOUNTER — Ambulatory Visit: Payer: BC Managed Care – PPO

## 2013-06-10 ENCOUNTER — Ambulatory Visit (INDEPENDENT_AMBULATORY_CARE_PROVIDER_SITE_OTHER): Payer: BC Managed Care – PPO | Admitting: Family Medicine

## 2013-06-10 VITALS — BP 102/60 | HR 93 | Temp 98.5°F | Resp 17 | Wt 138.0 lb

## 2013-06-10 DIAGNOSIS — R05 Cough: Secondary | ICD-10-CM

## 2013-06-10 DIAGNOSIS — J209 Acute bronchitis, unspecified: Secondary | ICD-10-CM

## 2013-06-10 DIAGNOSIS — R0602 Shortness of breath: Secondary | ICD-10-CM

## 2013-06-10 DIAGNOSIS — R911 Solitary pulmonary nodule: Secondary | ICD-10-CM

## 2013-06-10 DIAGNOSIS — R061 Stridor: Secondary | ICD-10-CM

## 2013-06-10 LAB — POCT CBC
Granulocyte percent: 72.8 %G (ref 37–80)
HCT, POC: 40.5 % (ref 37.7–47.9)
MCH, POC: 30.9 pg (ref 27–31.2)
MCV: 101 fL — AB (ref 80–97)
POC LYMPH PERCENT: 21.3 %L (ref 10–50)
RBC: 4.01 M/uL — AB (ref 4.04–5.48)
WBC: 8.6 10*3/uL (ref 4.6–10.2)

## 2013-06-10 MED ORDER — METHYLPREDNISOLONE SODIUM SUCC 125 MG IJ SOLR: 125.0000 mg | Freq: Once | INTRAMUSCULAR | Status: DC

## 2013-06-10 MED ORDER — PREDNISONE 20 MG PO TABS: ORAL_TABLET | ORAL | Status: DC

## 2013-06-10 MED ORDER — METHYLPREDNISOLONE SODIUM SUCC 125 MG IJ SOLR
125.0000 mg | Freq: Once | INTRAMUSCULAR | Status: AC
  Administered 2013-06-10: 125 mg via INTRAMUSCULAR

## 2013-06-10 MED ORDER — ALBUTEROL SULFATE (2.5 MG/3ML) 0.083% IN NEBU
2.5000 mg | INHALATION_SOLUTION | Freq: Once | RESPIRATORY_TRACT | Status: AC
  Administered 2013-06-10: 2.5 mg via RESPIRATORY_TRACT

## 2013-06-10 MED ORDER — AZITHROMYCIN 250 MG PO TABS: ORAL_TABLET | ORAL | Status: DC

## 2013-06-10 MED ORDER — IPRATROPIUM BROMIDE 0.02 % IN SOLN
0.5000 mg | Freq: Once | RESPIRATORY_TRACT | Status: AC
  Administered 2013-06-10: 0.5 mg via RESPIRATORY_TRACT

## 2013-06-10 MED ORDER — ALBUTEROL SULFATE HFA 108 (90 BASE) MCG/ACT IN AERS: 2.0000 | INHALATION_SPRAY | RESPIRATORY_TRACT | Status: DC | PRN

## 2013-06-10 NOTE — Progress Notes (Addendum)
Subjective:    Patient ID: Jaime Young, female    DOB: June 19, 1950, 62 y.o.   MRN: [id number]  HPI  This chart was scribed for Sherren Mocha, MD, by Ellin Mayhew, ED Scribe. This patient was seen in room 12 and the patient's care was started at 8:45 AM.  Chief Complaint  Patient presents with  . Cough    has gotten worse since 12/23  . Shortness of Breath   HPI Comments: Jaime Young is a 62 y.o. female who presents to the [facility] complaining of a non productive cough that began three days ago. She was seen at our clinic at that time and was prescribed Prednisone and cough syrup with temporary relief. The patient reports that the cough has progressively worsened since her last visit and is currently taking Prednisone. Her voice has changed with the cough beginning last night as her symptoms worsened and as a result, has been having difficulty speaking. Beginning last night, she has had associated CP with her cough. Her last fever was one week ago. Patient states she has had congestion, a runny nose, adenopathy, and R ear pain. She has used albuterol inhaler in the past and has not used one for her current symptoms. She denies smoking in the past. Patient has a history of cancer with lymph nodes removed. She has plans to travel tomorrow to the beach with no PCP in the region.  Her husband is concerned that she seems to be having a lot of trouble breathing overnight - sounds like she has croup.  Pt states that this same thing has happened to her several times in the past and that she usually ends up in the hospital intubated. States she has seen specialists and pulmonologist who have not been about explain why she gets the barking cough and [facility] recurrently ending up in respiratory failure.  Past Medical History  Diagnosis Date  . Osteopenia 09/18/2010  . Impaired glucose tolerance 11/17/2010  . Hyperlipidemia   . Anxiety   . Allergic rhinitis   . DJD (degenerative  joint disease) of lumbar spine   . Diverticulitis   . Diverticulosis of colon   . Migraine   . Raynaud's disease   . DVT (deep venous thrombosis) 1977  . Cancer     stage I right breast cancer  . RADIAL NERVE INJURY   . RAYNAUD'S DISEASE   . SUPERFICIAL PHLEBITIS 09/01/2007    Past Surgical History  Procedure Laterality Date  . Total abdominal hysterectomy w/ bilateral salpingoophorectomy  1983    secondary to edometriosis  . Back surgery  2002  . Hemorrhoid surgery  1983  . Tonsillectomy    . Breast surgery  1989/2001    implants  . Breast lumpectomy      axillary node dissection    Family History  Problem Relation Age of Onset  . Stroke Mother   . Heart disease Father   . Cancer Father   . Cancer Sister   . Hypertension Mother   . Breast cancer Other   . Bone cancer Other   . Stomach cancer Other     History   Social History  . Marital Status: Married    Spouse Name: N/A    Number of Children: 1  . Years of Education: N/A   Occupational History  . ACCOUNT EXECUTIVE   . Systems developer    Social History Main Topics  . Smoking status: Former Games developer  .  Smokeless tobacco: Not on file  . Alcohol Use: No  . Drug Use: No  . Sexual Activity: Not on file   Other Topics Concern  . Not on file   Social History Narrative  . No narrative on file    Allergies  Allergen Reactions  . Ciprofloxacin   . Codeine   . Penicillins   . Sulfonamide Derivatives    Current Outpatient Prescriptions on File Prior to Visit  Medication Sig Dispense Refill  . brimonidine-timolol (COMBIGAN) 0.2-0.5 % ophthalmic solution Place 1 drop into both eyes every 12 (twelve) hours.      . chlorpheniramine-HYDROcodone (TUSSIONEX PENNKINETIC ER) 10-8 MG/5ML LQCR Take 5 mLs by mouth every 12 (twelve) hours.  70 mL  0  . ibuprofen (ADVIL,MOTRIN) 200 MG tablet Take 200 mg by mouth every 6 (six) hours as needed for pain.      Marland Kitchen KLOR-CON M10 10 MEQ tablet TAKE ONE TABLET BY MOUTH ONCE  DAILY  90 tablet  1  . tamoxifen (NOLVADEX) 20 MG tablet Take 20 mg by mouth daily.       No current facility-administered medications on file prior to visit.    Review of Systems  Constitutional: Negative for fever and chills.  HENT: Positive for congestion, ear pain, rhinorrhea, sore throat and voice change.   Eyes: Negative for pain, discharge and itching.  Respiratory: Positive for cough, choking, chest tightness, shortness of breath, wheezing and stridor.   Cardiovascular: Positive for chest pain.  Gastrointestinal: Negative for nausea, vomiting, abdominal pain and diarrhea.  Genitourinary: Negative for decreased urine volume and difficulty urinating.  Musculoskeletal: Negative for myalgias.  Skin: Negative for color change and rash.  Neurological: Negative for headaches.  Hematological: Positive for adenopathy.     BP 102/60  Pulse 93  Temp(Src) 98.5 F (36.9 C) (Oral)  Resp 17  Wt 138 lb (62.596 kg)  SpO2 97% Objective:   Physical Exam  Nursing note and vitals reviewed. Constitutional: She is oriented to person, place, and time. She appears well-developed and well-nourished. No distress.  HENT:  Head: Normocephalic and atraumatic.  Nose: Nose normal. No mucosal edema.  Mouth/Throat: Oropharyngeal exudate and posterior oropharyngeal erythema present.  Mild oropharyngeal erythema with exudate. Bilateral canals occluded with cerumen.  Eyes: EOM are normal.  Neck: Neck supple. No tracheal deviation present. No mass and no thyromegaly present.  Cardiovascular: Normal rate, regular rhythm and normal heart sounds.   Pulmonary/Chest: Accessory muscle usage present. She is in respiratory distress. She has decreased breath sounds.  Stridor, severe barking cough, breathy high voice  Musculoskeletal: Normal range of motion.  Lymphadenopathy:    She has no cervical adenopathy.    She has no axillary adenopathy.       Right: No supraclavicular adenopathy present.       Left: No  supraclavicular adenopathy present.  Neurological: She is alert and oriented to person, place, and time.  Skin: Skin is warm and dry.  Psychiatric: She has a normal mood and affect. Her behavior is normal.   Primary X-ray reading by Dr. Clelia Croft: mildly flattened diaphragm, increased bibasilar interstitial infiltrates, normal mediastinum with no epiglottic or laryngeal abnormalities seen.  EXAM: CHEST 2 VIEW  COMPARISON: Chest radiograph Nov 05, 2009 and chest CT January 27, 2012  FINDINGS: There is a 1.6 x 1.3 cm nodular appearing opacity in the right apex. There is a nipple shadow on the right. There is No edema or consolidation apparent. The heart size and pulmonary vascularity are  normal. No adenopathy. There is postoperative change over the right breast and axillary regions. No bone lesions are appreciable.  IMPRESSION: 1.6 x 1.3 cm nodular appearing opacity right apex. Advise correlation with noncontrast enhanced chest CT to further evaluate. No edema or consolidation.     Assessment & Plan:  8:49 AM-Cough - Plan: POCT CBC, DG Chest 2 View, albuterol (PROVENTIL) (2.5 MG/3ML) 0.083% nebulizer solution 2.5 mg, methylPREDNISolone sodium succinate (SOLU-MEDROL) 125 mg/2 mL injection 125 mg, albuterol (PROVENTIL) (2.5 MG/3ML) 0.083% nebulizer solution 2.5 mg, ipratropium (ATROVENT) nebulizer solution 0.5 mg, DISCONTINUED: methylPREDNISolone sodium succinate (SOLU-MEDROL) 125 mg/2 mL injection 125 mg  9:35 AM-Shortness of breath - Plan: POCT CBC, DG Chest 2 View, albuterol (PROVENTIL) (2.5 MG/3ML) 0.083% nebulizer solution 2.5 mg, methylPREDNISolone sodium succinate (SOLU-MEDROL) 125 mg/2 mL injection 125 mg, albuterol (PROVENTIL) (2.5 MG/3ML) 0.083% nebulizer solution 2.5 mg, ipratropium (ATROVENT) nebulizer solution 0.5 mg, DISCONTINUED: methylPREDNISolone sodium succinate (SOLU-MEDROL) 125 mg/2 mL injection 125 mg - Pt is planning to leave on vacation tomorrow - Pt informed that SoluMedrol  is short acting and so there is a possibility she could acutely worsen tomorrow as it wears off - pt agrees to delay her trip by a day to cont to monitor sxs and is acquainted w/ an UC facility near her destination where she can receive acute care if necessary.  Pt feeling much better after neb treatment and SoluMedrol in office.  Reviewed potential med side effects inc palpitations and immunosuppression.  Reviewed prednisone taper instructions. Start zpack today.  Encouraged patient to review instructions for proper use of her Albuterol inhaler w/ pharmacist or RTC w/ inhaler if they are unable to review w/ her.    Acute bronchitis  Stridor  ADDENDUM: Lung nodule seen on imaging study - pt declines offer to f/u w/ CT scan - will discuss w/ her oncologist.  Called pt around 4 p.m. same day. She is feeling much better. Will RTC tomorrow if sxs recur.  Meds ordered this encounter  Medications  . albuterol (PROVENTIL) (2.5 MG/3ML) 0.083% nebulizer solution 2.5 mg    Sig:   . DISCONTD: methylPREDNISolone sodium succinate (SOLU-MEDROL) 125 mg/2 mL injection 125 mg    Sig:   . methylPREDNISolone sodium succinate (SOLU-MEDROL) 125 mg/2 mL injection 125 mg    Sig:   . albuterol (PROVENTIL) (2.5 MG/3ML) 0.083% nebulizer solution 2.5 mg    Sig:   . ipratropium (ATROVENT) nebulizer solution 0.5 mg    Sig:   . azithromycin (ZITHROMAX) 250 MG tablet    Sig: Take 2 tabs PO x 1 dose, then 1 tab PO QD x 4 days    Dispense:  6 tablet    Refill:  0  . predniSONE (DELTASONE) 20 MG tablet    Sig: Take 3 tabs po x 1d 12/27, 2 tabs po x 2d 12/-28-29    Dispense:  10 tablet    Refill:  0  . albuterol (PROVENTIL HFA;VENTOLIN HFA) 108 (90 BASE) MCG/ACT inhaler    Sig: Inhale 2 puffs into the lungs every 4 (four) hours as needed for wheezing or shortness of breath (cough, shortness of breath or wheezing.).    Dispense:  1 Inhaler    Refill:  1    I personally performed the services described in this  documentation, which was scribed in my presence. The recorded information has been reviewed and considered, and addended by me as needed.  Norberto Sorenson, MD MPH

## 2013-06-10 NOTE — Patient Instructions (Signed)
Stridor °Stridor is an abnormal, usually high-pitched sound made while breathing. It is the result of an airway that is partly blocked. Stridor occurs more often in children than in adults because children have smaller airways. Many different things can cause stridor. It might be an infection, a tumor, something stuck in the breathing passage, or part of a developmental problem of the airways. It is important that the symptoms be checked out promptly, especially in young children. °CAUSES  °Stridor can develop from an acute problem and come on quickly in children. This is often because: °· Something gets stuck in the child's throat, nose or airways. The stuck item could be anything, but might be a piece of food or a coin. °· The child develops croup. This is a breathing problem with a cough that sounds like a dog's bark. It results from swelling around the vocal cords. Croup is usually caused by a virus. °· The child develops swollen tonsils or adenoids (tonsillitis). °· The child develops a swollen area filled with pus on the tonsils (abscess). °· The child has an allergic reaction. This could be to something that was breathed in, swallowed or injected. °· The child had their airway evaluated by instruments or had a tube in their airway. °· The child develops epiglottitis. This is an emergency condition. This occurs when the epiglottis (a small piece of tissue that covers the windpipe when you swallow and keeps food from going into the lungs) becomes inflamed (the body's way or reacting to injury or infection). Different things can cause the inflammation, including: °· Infection (this is the usual cause). °· Injury (swallowing chemicals, for example). °Stridor also can develop from a longtime (chronic) problem. Possibilities include: °· Laryngomalacia. This occurs when floppy tissue above the vocal cords collapses into the airway when the child breathes in. °· Subglottic stenosis. This is a narrowing of the airway  just below the vocal cords. °· Tracheomalacia. This occurs when the cartilage that keeps the airway open is weak. The cartilage is weak and floppy causing the airway to collapse in. This can also occur when there is something compressing the airway or something damages the cartilage causing it to become weak. °· Vocal cord paralysis. This may result from trauma or brain abnormality. For instance, the vocal cords might have been injured during earlier surgery. °· An injury to the voice box. °· A tumor. °DIAGNOSIS  °In an emergency:  °· If something is stuck in a child's airway, the Heimlich maneuver might be used to force the item out of the windpipe. °· If something is blocking the airway, an artificial airway may need to be placed for relief of the obstruction. °· An operation may needed. °If the child is not in immediate danger: °· The child will be given a thorough exam. Usually, the child's temperature, pulse, breathing rate and oxygen levels will be checked. The healthcare provider will listen to the child's lungs through a stethoscope. The child's throat will be checked. °· The healthcare provider will check for swelling in the child's neck or face area. °· The healthcare provider will ask about the child's medical history. This will include questions about the abnormal breathing sound. They may ask when the abnormal breathing started and what did it sound like. °· The healthcare provider may also order some tests. These could include: °· Blood tests. The blood can give clues to the child's overall health. It also can show signs of infection. And, a blood test can show how   much oxygen the child is getting. °· Pulse oximetry . A device is put on the child's fingertip to measure oxygen levels in the blood. °· Bronchoscopy . A flexible tube with a camera and a light is used to evaluate the airways. The child probably will be given medication to numb pain and help the child relax for the test. If given general  anesthesia, the child will be asleep for the procedure. A local anesthetic would numb the area of the body, but the child would be awake. A sedative will help the child relax. °· CT (computed tomography) scan. This scan provides a detailed picture inside the body. °· Laryngoscopy . A small, lighted tube is used to check the area around voice box. This is usually done without sedation while the patient is awake °· X-ray of the chest or neck. This can sometimes locate something stuck in the airway or show swelling in the airway. °TREATMENT  °In the short term: °· If something is stuck in a child's airway, the Heimlich maneuver might be used to force the item out of the windpipe. °· If nothing is stuck but the child has serious trouble breathing, an artificial airway or an operation to create an airway may be needed °In the longer term, stridor is treated by treating whatever is causing it:  °· If a growth or tumor is causing the obstruction, surgery may be recommended to remove it. °· Antibiotics may be prescribed to treat an infection. °HOME CARE INSTRUCTIONS  °What care the child will need at home will depend on what caused the stridor and how it was treated. In general: °· Ask the child's healthcare provider if there is anything the child should or should not do while recovering. °· Make sure the child takes any medications that were prescribed. Follow the directions carefully. The child should take all of the medicine, unless the healthcare provider has given different instructions. °· Encourage the child to eat slowly. Careful eating can help prevent food from being inhaled accidentally. °SEEK MEDICAL CARE IF:  °· The child develops a fever above 100.5° F (38.1° C). °SEEK IMMEDIATE MEDICAL CARE IF:  °· The child has trouble breathing again. °· Other symptoms return. °· The child develops a fever above 102.0° F (38.9° C). °Document Released: 03/30/2009 Document Revised: 08/25/2011 Document Reviewed:  03/30/2009 °[employer]® Patient Information ©2014 [employer]. ° °

## 2013-06-13 ENCOUNTER — Other Ambulatory Visit: Payer: Self-pay | Admitting: *Deleted

## 2013-06-13 ENCOUNTER — Telehealth: Payer: Self-pay | Admitting: *Deleted

## 2013-06-13 NOTE — Telephone Encounter (Signed)
Pt called this RN to state per visit to [facility] due to respiratory issues - CXR done which shows concern for known area of concern. She was informed to alert this office with probable need for more urgent evaluation then currently scheduled for.  At present pt is out of town until Saturday.  CXR report noted in system.  This RN will review with MD for follow up plans.  Pt states she can be reached this week at her cell of [phone] or her husbands of [phone].

## 2013-06-20 ENCOUNTER — Other Ambulatory Visit: Payer: Self-pay | Admitting: *Deleted

## 2013-06-20 ENCOUNTER — Encounter: Payer: Self-pay | Admitting: Oncology

## 2013-06-20 DIAGNOSIS — Z853 Personal history of malignant neoplasm of breast: Secondary | ICD-10-CM

## 2013-06-20 NOTE — Progress Notes (Signed)
Per [facility], [phone], this policy does not require precert for outpatient radiology procedures.

## 2013-06-23 ENCOUNTER — Ambulatory Visit (HOSPITAL_COMMUNITY)
Admission: RE | Admit: 2013-06-23 | Discharge: 2013-06-23 | Disposition: A | Discharge status: Home / Self Care | Payer: BC Managed Care – PPO | Source: Ambulatory Visit | Attending: Oncology | Admitting: Oncology

## 2013-06-23 ENCOUNTER — Encounter (HOSPITAL_COMMUNITY): Payer: Self-pay

## 2013-06-23 DIAGNOSIS — Z853 Personal history of malignant neoplasm of breast: Secondary | ICD-10-CM

## 2013-06-23 DIAGNOSIS — R911 Solitary pulmonary nodule: Secondary | ICD-10-CM | POA: Insufficient documentation

## 2013-06-23 DIAGNOSIS — C50919 Malignant neoplasm of unspecified site of unspecified female breast: Secondary | ICD-10-CM | POA: Insufficient documentation

## 2013-06-23 DIAGNOSIS — J984 Other disorders of lung: Secondary | ICD-10-CM | POA: Insufficient documentation

## 2013-06-24 ENCOUNTER — Other Ambulatory Visit: Payer: Self-pay | Admitting: Oncology

## 2013-06-24 ENCOUNTER — Encounter: Payer: Self-pay | Admitting: Oncology

## 2013-06-30 ENCOUNTER — Telehealth: Payer: Self-pay | Admitting: *Deleted

## 2013-06-30 NOTE — Telephone Encounter (Signed)
This RN spoke with pt per results of CT showing no evidence of metastatic disease.

## 2013-08-25 ENCOUNTER — Other Ambulatory Visit: Payer: Self-pay | Admitting: Oncology

## 2013-08-29 ENCOUNTER — Other Ambulatory Visit (HOSPITAL_BASED_OUTPATIENT_CLINIC_OR_DEPARTMENT_OTHER): Payer: BC Managed Care – PPO

## 2013-08-29 ENCOUNTER — Ambulatory Visit (HOSPITAL_BASED_OUTPATIENT_CLINIC_OR_DEPARTMENT_OTHER): Payer: BC Managed Care – PPO | Admitting: Physician Assistant

## 2013-08-29 ENCOUNTER — Encounter: Payer: Self-pay | Admitting: Physician Assistant

## 2013-08-29 ENCOUNTER — Telehealth: Payer: Self-pay | Admitting: Oncology

## 2013-08-29 VITALS — BP 114/74 | HR 80 | Temp 98.2°F | Resp 18 | Ht 63.0 in | Wt 129.2 lb

## 2013-08-29 DIAGNOSIS — C50419 Malignant neoplasm of upper-outer quadrant of unspecified female breast: Secondary | ICD-10-CM

## 2013-08-29 DIAGNOSIS — Z853 Personal history of malignant neoplasm of breast: Secondary | ICD-10-CM

## 2013-08-29 DIAGNOSIS — C50919 Malignant neoplasm of unspecified site of unspecified female breast: Secondary | ICD-10-CM

## 2013-08-29 DIAGNOSIS — E876 Hypokalemia: Secondary | ICD-10-CM

## 2013-08-29 DIAGNOSIS — Z170 Estrogen receptor positive status [ER+]: Secondary | ICD-10-CM

## 2013-08-29 DIAGNOSIS — Z86718 Personal history of other venous thrombosis and embolism: Secondary | ICD-10-CM | POA: Insufficient documentation

## 2013-08-29 LAB — COMPREHENSIVE METABOLIC PANEL (CC13)
ALBUMIN: 4.1 g/dL (ref 3.5–5.0)
ALT: 14 U/L (ref 0–55)
AST: 20 U/L (ref 5–34)
Alkaline Phosphatase: 37 U/L — ABNORMAL LOW (ref 40–150)
Anion Gap: 9 mEq/L (ref 3–11)
BUN: 11.7 mg/dL (ref 7.0–26.0)
CO2: 28 mEq/L (ref 22–29)
Calcium: 9.4 mg/dL (ref 8.4–10.4)
Chloride: 107 mEq/L (ref 98–109)
Creatinine: 0.8 mg/dL (ref 0.6–1.1)
GLUCOSE: 96 mg/dL (ref 70–140)
POTASSIUM: 3.8 meq/L (ref 3.5–5.1)
SODIUM: 144 meq/L (ref 136–145)
TOTAL PROTEIN: 6.8 g/dL (ref 6.4–8.3)
Total Bilirubin: 0.28 mg/dL (ref 0.20–1.20)

## 2013-08-29 LAB — CBC WITH DIFFERENTIAL/PLATELET
BASO%: 0.6 % (ref 0.0–2.0)
Basophils Absolute: 0 10*3/uL (ref 0.0–0.1)
EOS ABS: 0 10*3/uL (ref 0.0–0.5)
EOS%: 1 % (ref 0.0–7.0)
HCT: 39.5 % (ref 34.8–46.6)
HGB: 13.1 g/dL (ref 11.6–15.9)
LYMPH%: 34.6 % (ref 14.0–49.7)
MCH: 31.2 pg (ref 25.1–34.0)
MCHC: 33.2 g/dL (ref 31.5–36.0)
MCV: 94 fL (ref 79.5–101.0)
MONO#: 0.3 10*3/uL (ref 0.1–0.9)
MONO%: 5.8 % (ref 0.0–14.0)
NEUT%: 58 % (ref 38.4–76.8)
NEUTROS ABS: 2.8 10*3/uL (ref 1.5–6.5)
PLATELETS: 172 10*3/uL (ref 145–400)
RBC: 4.2 10*6/uL (ref 3.70–5.45)
RDW: 12.7 % (ref 11.2–14.5)
WBC: 4.8 10*3/uL (ref 3.9–10.3)
lymph#: 1.7 10*3/uL (ref 0.9–3.3)

## 2013-08-29 MED ORDER — POTASSIUM CHLORIDE CRYS ER 10 MEQ PO TBCR: 10.0000 meq | EXTENDED_RELEASE_TABLET | Freq: Every day | ORAL | Status: DC

## 2013-08-29 MED ORDER — TAMOXIFEN CITRATE 20 MG PO TABS: 20.0000 mg | ORAL_TABLET | Freq: Every day | ORAL | Status: DC

## 2013-08-29 NOTE — Telephone Encounter (Signed)
, °
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
## 2013-10-25 ENCOUNTER — Other Ambulatory Visit: Payer: Self-pay | Admitting: Internal Medicine

## 2013-10-25 ENCOUNTER — Telehealth: Payer: Self-pay | Admitting: Internal Medicine

## 2013-10-25 DIAGNOSIS — Z0000 Encounter for general adult medical examination without abnormal findings: Secondary | ICD-10-CM

## 2013-10-25 NOTE — Telephone Encounter (Signed)
Patient wants to have a colonoscopy with Dr. Olevia Perches and is asking that she be referred to [facility].  She had her last Colonoscopy in 2004. Please advise.

## 2013-10-25 NOTE — Telephone Encounter (Signed)
This has been done per emr 

## 2013-10-29 ENCOUNTER — Ambulatory Visit (INDEPENDENT_AMBULATORY_CARE_PROVIDER_SITE_OTHER): Payer: BC Managed Care – PPO | Admitting: Physician Assistant

## 2013-10-29 ENCOUNTER — Other Ambulatory Visit: Payer: Self-pay | Admitting: Physician Assistant

## 2013-10-29 VITALS — BP 110/64 | HR 100 | Temp 99.1°F | Resp 16 | Ht 60.0 in | Wt 130.0 lb

## 2013-10-29 DIAGNOSIS — L259 Unspecified contact dermatitis, unspecified cause: Secondary | ICD-10-CM

## 2013-10-29 DIAGNOSIS — R21 Rash and other nonspecific skin eruption: Secondary | ICD-10-CM

## 2013-10-29 LAB — POCT CBC
GRANULOCYTE PERCENT: 80.2 % — AB (ref 37–80)
HCT, POC: 44.1 % (ref 37.7–47.9)
HEMOGLOBIN: 14.3 g/dL (ref 12.2–16.2)
Lymph, poc: 1.1 (ref 0.6–3.4)
MCH: 31.1 pg (ref 27–31.2)
MCHC: 32.4 g/dL (ref 31.8–35.4)
MCV: 96.8 fL (ref 80–97)
MID (CBC): 0.3 (ref 0–0.9)
MPV: 8.4 fL (ref 0–99.8)
PLATELET COUNT, POC: 201 10*3/uL (ref 142–424)
POC GRANULOCYTE: 5.4 (ref 2–6.9)
POC LYMPH PERCENT: 15.9 %L (ref 10–50)
POC MID %: 3.9 % (ref 0–12)
RBC: 4.56 M/uL (ref 4.04–5.48)
RDW, POC: 13 %
WBC: 6.7 10*3/uL (ref 4.6–10.2)

## 2013-10-29 MED ORDER — METHYLPREDNISOLONE SODIUM SUCC 125 MG IJ SOLR
125.0000 mg | Freq: Once | INTRAMUSCULAR | Status: AC
  Administered 2013-10-29: 125 mg via INTRAMUSCULAR

## 2013-10-29 MED ORDER — DOXYCYCLINE HYCLATE 100 MG PO CAPS: 100.0000 mg | ORAL_CAPSULE | Freq: Two times a day (BID) | ORAL | Status: DC

## 2013-10-29 MED ORDER — PREDNISONE 20 MG PO TABS: ORAL_TABLET | ORAL | Status: DC

## 2013-10-29 NOTE — Progress Notes (Signed)
Subjective:    Patient ID: Jaime Young, female    DOB: 06/11/1951, 63 y.o.   MRN: [id number]  HPI Primary Physician: Cathlean Cower, MD  Chief Complaint: Rash  HPI: 63 y.o. female with history below presents with 1 day history of pruritic rash along the bilateral ankles, bilateral wrists, palms, legs, abdomen, and back. Rash began along the ankles and wrists and progressed to the other locations above. Patient states rash is quite pruritic, keeping her up all night the previous night. She recently received an injection along her left eye for macular degeneration, Eyelea on 10/27/13 at [facility]. She contacted her treatment team there, however they are unaware of any systemic adverse effects. Prior to the onset of the above rash the patient was doing some outside gardening work pruning her [place]. She was wearing gloves while doing this. Her husband had recently mowed the lawn and the patient had handled his clothing. She is uncertain if he had gotten into any poison ivy, but he did not have any on him. Her vision in the eye that she received the injection in is at baseline. She does not know of any tick bites. Afebrile. She did have a headache a couple of days ago. None currently No neck stiffness. No photophobia.     Past Medical History  Diagnosis Date  . Osteopenia 09/18/2010  . Impaired glucose tolerance 11/17/2010  . Hyperlipidemia   . Anxiety   . Allergic rhinitis   . DJD (degenerative joint disease) of lumbar spine   . Diverticulitis   . Diverticulosis of colon   . Migraine   . Raynaud's disease   . DVT (deep venous thrombosis) 1977  . RADIAL NERVE INJURY   . RAYNAUD'S DISEASE   . SUPERFICIAL PHLEBITIS 09/01/2007  . Cancer     stage I right breast cancer     Home Meds: Prior to Admission medications   Medication Sig Start Date End Date Taking? Authorizing Provider  brimonidine-timolol (COMBIGAN) 0.2-0.5 % ophthalmic solution Place 1 drop into both eyes at bedtime.    Yes Historical  Provider, MD  potassium chloride (KLOR-CON M10) 10 MEQ tablet Take 1 tablet (10 mEq total) by mouth daily. 08/29/13  Yes Amy Milda Smart, PA-C  tamoxifen (NOLVADEX) 20 MG tablet Take 1 tablet (20 mg total) by mouth daily. 08/29/13  Yes Amy Milda Smart, PA-C    Allergies:  Allergies  Allergen Reactions  . Ciprofloxacin   . Codeine   . Penicillins   . Sulfonamide Derivatives     History   Social History  . Marital Status: Married    Spouse Name: N/A    Number of Children: 1  . Years of Education: N/A   Occupational History  . ACCOUNT EXECUTIVE   . Herbalist    Social History Main Topics  . Smoking status: Former Research scientist (life sciences)  . Smokeless tobacco: Not on file  . Alcohol Use: No  . Drug Use: No  . Sexual Activity: Not on file   Other Topics Concern  . Not on file   Social History Narrative  . No narrative on file     Review of Systems  Constitutional: Positive for fatigue. Negative for fever and chills.  Eyes: Positive for redness. Negative for photophobia, pain, discharge, itching and visual disturbance.       Left eye.   Respiratory: Negative for cough and chest tightness.   Skin: Positive for rash. Negative for wound.  Neurological: Positive for headaches.  Objective:   Physical Exam  Physical Exam: Blood pressure 110/64, pulse 100, temperature 99.1 F (37.3 C), resp. rate 16, height 5' (1.524 m), weight 130 lb (58.968 kg), SpO2 98.00%., Body mass index is 25.39 kg/(m^2). General: Well developed, well nourished, in no acute distress. Head: Normocephalic, atraumatic, eyes without discharge, sclera non-icteric, nares are without discharge. Bilateral auditory canals clear, TM's are without perforation, pearly grey and translucent with reflective cone of light bilaterally. Oral cavity moist, posterior pharynx without exudate, erythema, peritonsillar abscess, or post nasal drip. Uvula midline.   Neck: Supple. No thyromegaly. Full ROM. No lymphadenopathy. No nuchal  rigidity.  Lungs: Clear bilaterally to auscultation without wheezes, rales, or rhonchi. Breathing is unlabored. Heart: RRR with S1 S2. No murmurs, rubs, or gallops appreciated. Msk:  Strength and tone normal for age. Extremities/Skin: Warm and dry. No clubbing or cyanosis. No edema. The bilateral ankles, wrists, palms, abdomen, and back have erythematous rash. Rash along the ankles is greatest along the medial aspects. There are vesicles along the legs, wrists, and back. Surrounding the vesicles are blanching halos. No secondary infection is present.  Neuro: Alert and oriented X 3. Moves all extremities spontaneously. Gait is normal. CNII-XII grossly in tact. Psych:  Responds to questions appropriately with a normal affect.    Labs: Results for orders placed in visit on 10/29/13  POCT CBC      Result Value Ref Range   WBC 6.7  4.6 - 10.2 K/uL   Lymph, poc 1.1  0.6 - 3.4   POC LYMPH PERCENT 15.9  10 - 50 %L   MID (cbc) 0.3  0 - 0.9   POC MID % 3.9  0 - 12 %M   POC Granulocyte 5.4  2 - 6.9   Granulocyte percent 80.2 (*) 37 - 80 %G   RBC 4.56  4.04 - 5.48 M/uL   Hemoglobin 14.3  12.2 - 16.2 g/dL   HCT, POC 44.1  37.7 - 47.9 %   MCV 96.8  80 - 97 fL   MCH, POC 31.1  27 - 31.2 pg   MCHC 32.4  31.8 - 35.4 g/dL   RDW, POC 13.0     Platelet Count, POC 201  142 - 424 K/uL   MPV 8.4  0 - 99.8 fL    CMP, RMSF, Lyme, and Ehrlichia titers all pending    Assessment & Plan:  63 year old female with contact dermatitis vs tick borne illness  -SoluMedrol 125 mg IM -Doxycycline 100 mg 1 po bid #20 no RF -Prednisone 20 mg #18 3x3, 2x3, 1x3 no RF -Recheck 11/01/13, sooner if needed -Discussed with and examnied by Dr. Elder Cyphers, agrees with the above   Christell Faith, MHS, PA-C [facility] [place], [place] [id number] [facility] 10/29/2013 9:19 AM

## 2013-10-30 LAB — COMPREHENSIVE METABOLIC PANEL
ALT: 25 U/L (ref 0–35)
AST: 33 U/L (ref 0–37)
Albumin: 4.4 g/dL (ref 3.5–5.2)
Alkaline Phosphatase: 36 U/L — ABNORMAL LOW (ref 39–117)
BUN: 13 mg/dL (ref 6–23)
CALCIUM: 9.3 mg/dL (ref 8.4–10.5)
CHLORIDE: 102 meq/L (ref 96–112)
CO2: 25 meq/L (ref 19–32)
CREATININE: 0.8 mg/dL (ref 0.50–1.10)
Glucose, Bld: 90 mg/dL (ref 70–99)
Potassium: 3.6 mEq/L (ref 3.5–5.3)
SODIUM: 138 meq/L (ref 135–145)
TOTAL PROTEIN: 6.8 g/dL (ref 6.0–8.3)
Total Bilirubin: 0.4 mg/dL (ref 0.2–1.2)

## 2013-10-31 LAB — B. BURGDORFI ANTIBODIES: B burgdorferi Ab IgG+IgM: 0.62 {ISR}

## 2013-11-01 ENCOUNTER — Telehealth: Payer: Self-pay | Admitting: Physician Assistant

## 2013-11-01 LAB — ROCKY MTN SPOTTED FVR AB, IGM-BLOOD: ROCKY MTN SPOTTED FEVER, IGM: 0.17 IV

## 2013-11-01 NOTE — Telephone Encounter (Signed)
Patient came to office and stated that she was seen by Christell Faith on Saturday. Was told to come back today for a follow up however she stated that she feels much better and did not think she needs to be rechecked. Stated that she will wait for a phone call and if necessary she will gladly come back in for an OV.  [phone]

## 2013-11-01 NOTE — Telephone Encounter (Signed)
Pt is 100% better. Told her it was ok to not come back in

## 2013-11-02 LAB — EHRLICHIA ANTIBODY PANEL
E chaffeensis (HGE) Ab, IgG: <1:64 {titer}
E chaffeensis (HGE) Ab, IgM: <1:20 {titer}

## 2013-11-02 NOTE — Telephone Encounter (Signed)
Noted. Will call when when get all of the labs back.

## 2013-11-03 LAB — ROCKY MTN SPOTTED FVR AB, IGG-BLOOD: RMSF IgG: 0.2 IV

## 2013-11-28 ENCOUNTER — Encounter: Payer: Self-pay | Admitting: Internal Medicine

## 2014-01-11 ENCOUNTER — Encounter: Payer: Self-pay | Admitting: Internal Medicine

## 2014-01-16 ENCOUNTER — Ambulatory Visit (AMBULATORY_SURGERY_CENTER): Payer: Self-pay | Admitting: *Deleted

## 2014-01-16 VITALS — Ht 62.0 in | Wt 132.0 lb

## 2014-01-16 DIAGNOSIS — Z1211 Encounter for screening for malignant neoplasm of colon: Secondary | ICD-10-CM

## 2014-01-16 MED ORDER — MOVIPREP 100 G PO SOLR
ORAL | Status: DC
Start: 2014-01-16 — End: 2014-02-01

## 2014-01-16 NOTE — Progress Notes (Signed)
Patient denies any allergies to eggs or soy. Patient has nausea after surgery with anesthesia. Patient denies any oxygen use at home and does not take any diet/weight loss medications. EMMI education assisgned to patient on colonoscopy, this was explained and instructions given to patient.

## 2014-01-26 ENCOUNTER — Encounter: Payer: Self-pay | Admitting: Internal Medicine

## 2014-02-01 ENCOUNTER — Ambulatory Visit (AMBULATORY_SURGERY_CENTER): Payer: BC Managed Care – PPO | Admitting: Internal Medicine

## 2014-02-01 ENCOUNTER — Encounter: Payer: Self-pay | Admitting: Internal Medicine

## 2014-02-01 VITALS — BP 127/75 | HR 66 | Temp 98.3°F | Resp 15 | Ht 62.0 in | Wt 132.0 lb

## 2014-02-01 DIAGNOSIS — D126 Benign neoplasm of colon, unspecified: Secondary | ICD-10-CM

## 2014-02-01 DIAGNOSIS — Z1211 Encounter for screening for malignant neoplasm of colon: Secondary | ICD-10-CM

## 2014-02-01 MED ORDER — SODIUM CHLORIDE 0.9 % IV SOLN: 500.0000 mL | INTRAVENOUS | Status: DC

## 2014-02-01 NOTE — Patient Instructions (Signed)
YOU HAD AN ENDOSCOPIC PROCEDURE TODAY AT THE [facility]: Refer to the procedure report that was given to you for any specific questions about what was found during the examination.  If the procedure report does not answer your questions, please call your gastroenterologist to clarify.  If you requested that your care partner not be given the details of your procedure findings, then the procedure report has been included in a sealed envelope for you to review at your convenience later.  YOU SHOULD EXPECT: Some feelings of bloating in the abdomen. Passage of more gas than usual.  Walking can help get rid of the air that was put into your GI tract during the procedure and reduce the bloating. If you had a lower endoscopy (such as a colonoscopy or flexible sigmoidoscopy) you may notice spotting of blood in your stool or on the toilet paper. If you underwent a bowel prep for your procedure, then you may not have a normal bowel movement for a few days.  DIET: Your first meal following the procedure should be a light meal and then it is ok to progress to your normal diet.  A half-sandwich or bowl of soup is an example of a good first meal.  Heavy or fried foods are harder to digest and may make you feel nauseous or bloated.  Likewise meals heavy in dairy and vegetables can cause extra gas to form and this can also increase the bloating.  Drink plenty of fluids but you should avoid alcoholic beverages for 24 hours.  ACTIVITY: Your care partner should take you home directly after the procedure.  You should plan to take it easy, moving slowly for the rest of the day.  You can resume normal activity the day after the procedure however you should NOT DRIVE or use heavy machinery for 24 hours (because of the sedation medicines used during the test).    SYMPTOMS TO REPORT IMMEDIATELY: A gastroenterologist can be reached at any hour.  During normal business hours, 8:30 AM to 5:00 PM Monday through Friday,  call [phone].  After hours and on weekends, please call the GI answering service at [phone] who will take a message and have the physician on call contact you.   Following lower endoscopy (colonoscopy or flexible sigmoidoscopy):  Excessive amounts of blood in the stool  Significant tenderness or worsening of abdominal pains  Swelling of the abdomen that is new, acute  Fever of 100F or higher   FOLLOW UP: If any biopsies were taken you will be contacted by phone or by letter within the next 1-3 weeks.  Call your gastroenterologist if you have not heard about the biopsies in 3 weeks.  Our staff will call the home number listed on your records the next business day following your procedure to check on you and address any questions or concerns that you may have at that time regarding the information given to you following your procedure. This is a courtesy call and so if there is no answer at the home number and we have not heard from you through the emergency physician on call, we will assume that you have returned to your regular daily activities without incident.  SIGNATURES/CONFIDENTIALITY: You and/or your care partner have signed paperwork which will be entered into your electronic medical record.  These signatures attest to the fact that that the information above on your After Visit Summary has been reviewed and is understood.  Full responsibility of the confidentiality of   this discharge information lies with you and/or your care-partner.   Resume medications. Information given on polyps and high fiber diet with discharge instructions. 

## 2014-02-01 NOTE — Progress Notes (Signed)
Report to PACU, RN, vss, BBS= Clear.  

## 2014-02-01 NOTE — Progress Notes (Signed)
Called to room to assist during endoscopic procedure.  Patient ID and intended procedure confirmed with present staff. Received instructions for my participation in the procedure from the performing physician.  

## 2014-02-01 NOTE — Op Note (Addendum)
[facility]  [address] [place], [id number]   COLONOSCOPY PROCEDURE REPORT  PATIENT: Jaime Young, Jaime Young  MR#: [id number] BIRTHDATE: Nov 23, 1950 , 63  yrs. old GENDER: Female ENDOSCOPIST: Lafayette Dragon, MD REFERRED ZD:GUYQI John, M.D. PROCEDURE DATE:  02/01/2014 PROCEDURE:   Colonoscopy with snare polypectomy First Screening Colonoscopy - Avg.  risk and is 50 yrs.  old or older - No.  Prior Negative Screening - Now for repeat screening. 10 or more years since last screening  History of Adenoma - Now for follow-up colonoscopy & has been > or = to 3 yrs.  N/A  Polyps Removed Today? Yes. ASA CLASS:   Class II INDICATIONS:Prior colonoscopy in May 2004 was normal. MEDICATIONS: MAC sedation, administered by CRNA and propofol (Diprivan) 300mg  IV  DESCRIPTION OF PROCEDURE:   After the risks benefits and alternatives of the procedure were thoroughly explained, informed consent was obtained.  A digital rectal exam revealed no abnormalities of the rectum.   The     endoscope was introduced through the anus and advanced to the cecum, which was identified by both the appendix and ileocecal valve. No adverse events experienced.   The quality of the prep was good, using MoviPrep The instrument was then slowly withdrawn as the colon was fully examined.      COLON FINDINGS: A sessile polyp ranging between 3-58mm in size was found in the ascending colon.  A polypectomy was performed with a cold snare.  The resection was complete and the polyp tissue was completely retrieved.  Retroflexed views revealed no abnormalities. The time to cecum=5 minutes 11 seconds.  Withdrawal time=8 minutes 42 seconds.  The scope was withdrawn and the procedure completed. COMPLICATIONS: There were no complications.  ENDOSCOPIC IMPRESSION: Sessile polyp ranging between 3-32mm in size was found in the ascending colon; polypectomy was performed with a cold snare  RECOMMENDATIONS: 1.  Await  pathology results 2.  high fiber diet Recall colonoscopy pending path report   eSigned:  Lafayette Dragon, MD 02/01/2014 11:15 AM Revised: 02/01/2014 11:15 AM  cc:   PATIENT NAME:  Jaime Young, Jaime Young MR#: [id number]

## 2014-02-02 ENCOUNTER — Telehealth: Payer: Self-pay

## 2014-02-02 NOTE — Telephone Encounter (Signed)
  Follow up Call-  Call back number 02/01/2014  Post procedure Call Back phone  # [phone]  Permission to leave phone message Yes     Patient questions:  Do you have a fever, pain , or abdominal swelling? No. Pain Score  0 *  Have you tolerated food without any problems? Yes.    Have you been able to return to your normal activities? Yes.    Do you have any questions about your discharge instructions: Diet   No. Medications  No. Follow up visit  No.  Do you have questions or concerns about your Care? No.  Actions: * If pain score is 4 or above: No action needed, pain <4.

## 2014-02-07 ENCOUNTER — Encounter: Payer: Self-pay | Admitting: Internal Medicine

## 2014-02-09 ENCOUNTER — Encounter: Payer: Self-pay | Admitting: *Deleted

## 2014-03-07 ENCOUNTER — Other Ambulatory Visit: Payer: Self-pay | Admitting: Dermatology

## 2014-03-14 ENCOUNTER — Other Ambulatory Visit: Payer: Self-pay | Admitting: Dermatology

## 2014-08-17 DIAGNOSIS — H2511 Age-related nuclear cataract, right eye: Secondary | ICD-10-CM | POA: Insufficient documentation

## 2014-08-17 DIAGNOSIS — Z961 Presence of intraocular lens: Secondary | ICD-10-CM | POA: Insufficient documentation

## 2014-09-04 ENCOUNTER — Other Ambulatory Visit: Payer: BC Managed Care – PPO

## 2014-09-08 ENCOUNTER — Other Ambulatory Visit: Payer: Self-pay | Admitting: *Deleted

## 2014-09-08 DIAGNOSIS — Z853 Personal history of malignant neoplasm of breast: Secondary | ICD-10-CM

## 2014-09-11 ENCOUNTER — Ambulatory Visit (HOSPITAL_BASED_OUTPATIENT_CLINIC_OR_DEPARTMENT_OTHER): Payer: BLUE CROSS/BLUE SHIELD | Admitting: Nurse Practitioner

## 2014-09-11 ENCOUNTER — Other Ambulatory Visit (HOSPITAL_BASED_OUTPATIENT_CLINIC_OR_DEPARTMENT_OTHER): Payer: BLUE CROSS/BLUE SHIELD

## 2014-09-11 ENCOUNTER — Telehealth: Payer: Self-pay | Admitting: Nurse Practitioner

## 2014-09-11 ENCOUNTER — Encounter: Payer: Self-pay | Admitting: Nurse Practitioner

## 2014-09-11 ENCOUNTER — Other Ambulatory Visit: Payer: Self-pay | Admitting: *Deleted

## 2014-09-11 VITALS — BP 123/62 | HR 66 | Temp 98.5°F | Resp 18 | Ht 62.0 in | Wt 131.4 lb

## 2014-09-11 DIAGNOSIS — E876 Hypokalemia: Secondary | ICD-10-CM

## 2014-09-11 DIAGNOSIS — C50919 Malignant neoplasm of unspecified site of unspecified female breast: Secondary | ICD-10-CM | POA: Insufficient documentation

## 2014-09-11 DIAGNOSIS — Z853 Personal history of malignant neoplasm of breast: Secondary | ICD-10-CM

## 2014-09-11 DIAGNOSIS — C50911 Malignant neoplasm of unspecified site of right female breast: Secondary | ICD-10-CM

## 2014-09-11 DIAGNOSIS — Z79810 Long term (current) use of selective estrogen receptor modulators (SERMs): Secondary | ICD-10-CM | POA: Diagnosis not present

## 2014-09-11 DIAGNOSIS — Z170 Estrogen receptor positive status [ER+]: Secondary | ICD-10-CM

## 2014-09-11 LAB — COMPREHENSIVE METABOLIC PANEL (CC13)
ALK PHOS: 34 U/L — AB (ref 40–150)
ALT: 17 U/L (ref 0–55)
AST: 20 U/L (ref 5–34)
Albumin: 3.7 g/dL (ref 3.5–5.0)
Anion Gap: 11 mEq/L (ref 3–11)
BILIRUBIN TOTAL: 0.23 mg/dL (ref 0.20–1.20)
BUN: 9.5 mg/dL (ref 7.0–26.0)
CO2: 29 mEq/L (ref 22–29)
CREATININE: 0.8 mg/dL (ref 0.6–1.1)
Calcium: 9.1 mg/dL (ref 8.4–10.4)
Chloride: 107 mEq/L (ref 98–109)
EGFR: 79 mL/min/{1.73_m2} — AB (ref >90)
GLUCOSE: 84 mg/dL (ref 70–140)
Potassium: 3.9 mEq/L (ref 3.5–5.1)
Sodium: 147 mEq/L — ABNORMAL HIGH (ref 136–145)
Total Protein: 6.2 g/dL — ABNORMAL LOW (ref 6.4–8.3)

## 2014-09-11 LAB — CBC WITH DIFFERENTIAL/PLATELET
BASO%: 0.3 % (ref 0.0–2.0)
BASOS ABS: 0 10*3/uL (ref 0.0–0.1)
EOS ABS: 0.1 10*3/uL (ref 0.0–0.5)
EOS%: 3.6 % (ref 0.0–7.0)
HEMATOCRIT: 38.1 % (ref 34.8–46.6)
HEMOGLOBIN: 12.8 g/dL (ref 11.6–15.9)
LYMPH%: 32.1 % (ref 14.0–49.7)
MCH: 31.4 pg (ref 25.1–34.0)
MCHC: 33.6 g/dL (ref 31.5–36.0)
MCV: 93.4 fL (ref 79.5–101.0)
MONO#: 0.3 10*3/uL (ref 0.1–0.9)
MONO%: 7.9 % (ref 0.0–14.0)
NEUT%: 56.1 % (ref 38.4–76.8)
NEUTROS ABS: 1.9 10*3/uL (ref 1.5–6.5)
PLATELETS: 154 10*3/uL (ref 145–400)
RBC: 4.08 10*6/uL (ref 3.70–5.45)
RDW: 12.5 % (ref 11.2–14.5)
WBC: 3.3 10*3/uL — AB (ref 3.9–10.3)
lymph#: 1.1 10*3/uL (ref 0.9–3.3)

## 2014-09-11 MED ORDER — TAMOXIFEN CITRATE 20 MG PO TABS: 20.0000 mg | ORAL_TABLET | Freq: Every day | ORAL | Status: DC

## 2014-09-11 NOTE — Addendum Note (Signed)
Addended by: Marcelino Duster on: 09/11/2014 05:35 PM   Modules accepted: Orders

## 2014-09-11 NOTE — Telephone Encounter (Signed)
appointments made and avs printed for patient °

## 2014-09-11 NOTE — Progress Notes (Addendum)
ID: Jaime Young   DOB: [birth year]  MR#: [id number]  CSN#:[id number]   PCP:  Jaime Cower, MD GYN:  Jaime Hausen, FNP SUR:  Jaime Field, MD OTHER:    CHIEF COMPLAINT:  Hx of Right Breast Cancer CURRENT TREATMENT: tamoxifen daily  BREAST CANCER HISTORY:  She herself palpated a mass in her right breast, and brought it to Dr. Josie Young attention.  He set her up for a screening diagnostic mammography on July 09, 2009.  Dr. March Young performed the procedure, found no definite mammographic abnormality corresponding to the palpable complaint.  The ultrasound of the right breast did show a mixed echogenic mass at approximately 12 o'clock, extending to the implant capsule, and measuring 2.5 cm.  There was at least one abnormal lymph node in the right axilla, measuring 2.2 cm.    With this information, the patient was recalled for biopsy performed January 31,2011, and this showed ([id number]) an invasive ductal carcinoma with micropapillary features with the lymph node biopsy positive for a very similar looking tumor.  Grade of this tumor is not estimated.  The prognostic panel showed it to be estrogen receptor positive at 56%, progesterone receptor positive at 25%, with a borderline MIB-1 at 19%.  There was no amplification of HER2 by CISH with a ratio of 1.50.    With this information, the patient met with Dr. Donne Young, and bilateral breast MRIs were obtained July 18, 2009.  There was an enhancing 2.8 cm mass corresponding to the previously biopsied malignancy, and an enlarged right axillary lymph node measuring 2.1 cm.  The left breast was unremarkable.  Dr. Donne Young discussed these findings with the patient, and suggested neoadjuvant treatment.  Her only option would be a modified radical mastectomy.  He felt it was most reasonable to start with chemotherapy with a view to possibly making lumpectomy and radiation therapy possible.    Her subsequent history is as detailed below.  INTERVAL  HISTORY: Jaime Young returns alone today for follow up of her breast cancer. She has been on tamoxifen since May 2012 and is tolerating it well. She has no hot flashes, but she does have some vaginal dryness. She uses coconut oil as a lubricant. She walks daily, and cares for her 65 year old granddaughter during the day.  REVIEW OF SYSTEMS: Jaime Young denies fevers, chills, nausea, vomiting, or changes in bowel or bladder habits. She has no shortness of breath, chest pain, cough, or palpitations. She is eating and drinking well. Her hearing aids are working well. She developed a left eye infection earlier this month an is on 2 eye drops. She returns for follow up with her ophthalmologist in May. She has had some pain to her right axilla for the past few weeks. Her mammogram in early Jaime was normal, and she does not palpate any masses. A detailed review of systems is otherwise stable.  PAST MEDICAL HISTORY: Past Medical History  Diagnosis Date  . Osteopenia 09/18/2010  . Impaired glucose tolerance 11/17/2010  . Hyperlipidemia   . Anxiety   . Allergic rhinitis   . DJD (degenerative joint disease) of lumbar spine   . Diverticulitis   . Diverticulosis of colon   . Migraine   . Raynaud's disease   . DVT (deep venous thrombosis) 1977  . RADIAL NERVE INJURY   . RAYNAUD'S DISEASE   . SUPERFICIAL PHLEBITIS 09/01/2007  . Cancer     stage I right breast cancer  . Post-operative nausea and vomiting   . 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  The patient being status post TAH-BSO in 1983.  She underwent a hemorrhoidectomy remotely under [employer].  Of course she has breast implants in place.  These were removed and replaced in 2001 because of contractures.  She is status post tonsillectomy and adenoidectomy.    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 Silvestre Young.  She has also had left eye cataract surgery.  She has a history of migraines, a history of mild hypercholesterolemia, and a history of osteopenia.   PAST SURGICAL HISTORY: Past Surgical History  Procedure Laterality Date  . Total abdominal hysterectomy w/ bilateral salpingoophorectomy  1983    secondary to edometriosis  . Back surgery  2002  . Hemorrhoid surgery  1983  . Tonsillectomy    . Breast surgery  1989/2001    implants  . Breast lumpectomy Right 2011    axillary node dissection  . Pars plana vitrectomy Left 2013    w/Membranectomy    FAMILY HISTORY Family History  Problem Relation Age of Onset  . Stroke Mother   . Hypertension Mother   . Heart disease Father   . Cancer Father   . Esophageal cancer Father   . Cancer Sister   . Stomach cancer Sister   . Breast cancer Other   . Bone cancer Other   . Stomach cancer Other   . Colon cancer Neg Hx   . Rectal cancer Neg Hx   The patient's  father died at the age of 75 from what sounds like a gastroesophageal junction tumor.  The patient's  mother died at the age of 21 after multiple strokes.  The patient has five brothers and three sisters, all of her siblings are half-siblings.  The problems are all on her father's side.  The sister, Jaime Young, had breast and ovarian cancer.  She is deceased, and was never tested for BRCA1 and 2.  Second sister had throat cancer.  There is no other history of cancer in the immediate family to her knowledge.    GYNECOLOGIC HISTORY:  (Updated Jaime 2015) She is GXP1.  First pregnancy to term age 64.  Status post TAH/BSO in 1983 secondary to endometriosis. She took estrogen replacement between 1983 and 2010.  Went off the Climara patch at the time of diagnosis in 2011.    SOCIAL HISTORY:  (updated Jaime 2015)  She worked for the [employer] here in town but has retired.  Her husband, Jaime Young, works for [employer] in the [employer].  Daughter, Jaime Young, is a stay-at-home mom. Jaime Young has 3 grandchildren.  The patient attends a [employer].     ADVANCED DIRECTIVES: Not in place  HEALTH MAINTENANCE:   (updated 08/29/2013)  History  Substance Use Topics  . Smoking status: Former Research scientist (life sciences)  . Smokeless tobacco: Never Used  . Alcohol Use: No     Colonoscopy:  2004  PAP: Feb 2014/Dr. Lomax  Bone density:  09/11/2010 (Dr. Ubaldo Glassing), osteopenia  Lipid panel:  Jan 2014/Dr. John   Allergies  Allergen Reactions  . Ciprofloxacin Other (See Comments)    Pt denies  . Codeine Nausea Only  . Penicillins Hives  . Sulfonamide Derivatives Nausea Only  . Aflibercept Rash    Current Outpatient Prescriptions  Medication Sig Dispense Refill  .  brimonidine-timolol (COMBIGAN) 0.2-0.5 % ophthalmic solution Place 1 drop into both eyes at bedtime.     . Calcium-Magnesium-Vitamin D (CALCIUM 500) 500-250-200 MG-MG-UNIT TABS Take 3 tablets by mouth daily.    . Multiple Vitamins tablet Take 1 tablet by mouth daily.    . Omega-3 Fatty Acids (FISH OIL) 300 MG CAPS Take 1 capsule by mouth daily.    . potassium chloride (KLOR-CON M10) 10 MEQ tablet Take 1 tablet (10 mEq total) by mouth daily. 90 tablet 3  . prednisoLONE acetate (PRED FORTE) 1 % ophthalmic suspension 1 drop.    . tamoxifen (NOLVADEX) 20 MG tablet Take 1 tablet (20 mg total) by mouth daily. 90 tablet 3  . vitamin C (ASCORBIC ACID) 500 MG tablet Take 1,000 mg by mouth daily.    . vitamin E (VITAMIN E) 400 UNIT capsule Take 400 Units by mouth daily.     No current facility-administered medications for this visit.    OBJECTIVE: middle-aged white woman who appears well and is in no acute distress  Filed Vitals:   09/11/14 0839  BP: 123/62  Pulse: 66  Temp: 98.5 F (36.9 C)  Resp: 18  Body mass index is 24.03 kg/(m^2).    ECOG:  0 Filed Weights   09/11/14 0839  Weight: 131 lb 6.4 oz (59.603 kg)   Physical Exam:  Skin: warm, dry  HEENT: sclerae  anicteric, conjunctivae pink, oropharynx clear. No thrush or mucositis.  Lymph Nodes: No cervical or supraclavicular lymphadenopathy  Lungs: clear to auscultation bilaterally, no rales, wheezes, or rhonci  Heart: regular rate and rhythm  Abdomen: round, soft, non tender, positive bowel sounds  Musculoskeletal: No focal spinal tenderness, no peripheral edema  Neuro: non focal, well oriented, positive affect  Breasts: right breast status post lumpectomy and radiation. No evidence of recurrent disease. Right axilla benign. Left breast unremarkable.  LAB RESULTS: Lab Results  Component Value Date   WBC 3.3* 09/11/2014   NEUTROABS 1.9 09/11/2014   HGB 12.8 09/11/2014   HCT 38.1 09/11/2014   MCV 93.4 09/11/2014   PLT 154 09/11/2014      Chemistry      Component Value Date/Time   NA 138 10/29/2013 0839   NA 144 08/29/2013 1436   NA 142 01/27/2012 0942   K 3.6 10/29/2013 0839   K 3.8 08/29/2013 1436   K 4.0 01/27/2012 0942   CL 102 10/29/2013 0839   CL 105 08/02/2012 1513   CL 102 01/27/2012 0942   CO2 25 10/29/2013 0839   CO2 28 08/29/2013 1436   CO2 30 01/27/2012 0942   BUN 13 10/29/2013 0839   BUN 11.7 08/29/2013 1436   BUN 12 01/27/2012 0942   CREATININE 0.80 10/29/2013 0839   CREATININE 0.8 08/29/2013 1436   CREATININE 0.8 07/05/2012 0740      Component Value Date/Time   CALCIUM 9.3 10/29/2013 0839   CALCIUM 9.4 08/29/2013 1436   CALCIUM 9.2 01/27/2012 0942   ALKPHOS 36* 10/29/2013 0839   ALKPHOS 37* 08/29/2013 1436   ALKPHOS 41 01/27/2012 0942   AST 33 10/29/2013 0839   AST 20 08/29/2013 1436   AST 27 01/27/2012 0942   ALT 25 10/29/2013 0839   ALT 14 08/29/2013 1436   ALT 22 01/27/2012 0942   BILITOT 0.4 10/29/2013 0839   BILITOT 0.28 08/29/2013 1436   BILITOT 0.50 01/27/2012 0942      STUDIES: Most recent mammogram on 08/21/14 at [facility] was unremarkable.  ASSESSMENT: 64 y.o. Jaime Young woman   (  1)  status post right breast and axillary lymph node biopsy  January 2011, both positive for a clinical T2 N1 invasive ductal carcinoma which was estrogen and progesterone receptor-positive, HER-2/neu-negative with an MIB-1 of 19%.  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  Margins were negative.   (3)  completed radiation therapy in November 2011, then started letrozole but this had to be discontinued April 2012 because of tolerance issues.   (4) on tamoxifen as of May 2012  PLAN:  Amarria is doing well as far as her breast cancer is concerned. She is now 4 years out from her definitive surgery with no evidence of recurrent disease. She is tolerating the tamoxifen well and will continue for at least 5, possibly 10 years of antiestrogen therapy.   Her potassium level is up to 3.9, and she is only on 10 meq supplements daily. She asked to stop this drug, and I am willing to do so. We discussed potassium rich foods, so that she may supplement with diet alone.   Jora will return in 1 year for labs and a follow up visit. She understands and agrees with this plan. She knows the goal of treatment in her case is cure. She has been encouraged to call with any issues that might arise before her next visit here.   Laurie Panda, NP     09/11/2014

## 2014-09-18 ENCOUNTER — Encounter: Payer: Self-pay | Admitting: Oncology

## 2014-11-07 ENCOUNTER — Encounter: Payer: Self-pay | Admitting: Internal Medicine

## 2014-11-07 ENCOUNTER — Ambulatory Visit (INDEPENDENT_AMBULATORY_CARE_PROVIDER_SITE_OTHER): Payer: BLUE CROSS/BLUE SHIELD | Admitting: Internal Medicine

## 2014-11-07 ENCOUNTER — Ambulatory Visit (INDEPENDENT_AMBULATORY_CARE_PROVIDER_SITE_OTHER)
Admission: RE | Admit: 2014-11-07 | Discharge: 2014-11-07 | Disposition: A | Discharge status: Home / Self Care | Payer: BLUE CROSS/BLUE SHIELD | Source: Ambulatory Visit | Attending: Internal Medicine | Admitting: Internal Medicine

## 2014-11-07 VITALS — BP 128/82 | HR 81 | Temp 98.4°F | Resp 16 | Wt 132.0 lb

## 2014-11-07 DIAGNOSIS — J4521 Mild intermittent asthma with (acute) exacerbation: Secondary | ICD-10-CM

## 2014-11-07 MED ORDER — PREDNISONE 20 MG PO TABS
20.0000 mg | ORAL_TABLET | Freq: Two times a day (BID) | ORAL | Status: DC
Start: 2014-11-07 — End: 2015-07-31

## 2014-11-07 MED ORDER — AZITHROMYCIN 250 MG PO TABS: ORAL_TABLET | ORAL | Status: DC

## 2014-11-07 NOTE — Patient Instructions (Signed)
  Your next office appointment will be determined based upon review of your pending   xrays  Those written interpretation of the lab results and instructions will be transmitted to you by My Chart  Critical results will be called.   Followup as needed for any active or acute issue. Please report any significant change in your symptoms.To ER if breathing worsens despite new  medications .  Breo one inhalation daily; gargle and spit after use. Lot #:[id number] Exp 01/18  Plain Mucinex (NOT D) for thick secretions ;force NON dairy fluids .   Nasal cleansing in the shower as discussed with lather of mild shampoo.After 10 seconds wash off lather while  exhaling through nostrils. Make sure that all residual soap is removed to prevent irritation.  Flonase OR Nasacort AQ 1 spray in each nostril twice a day as needed. Use the "crossover" technique into opposite nostril spraying toward opposite ear @ 45 degree angle, not straight up into nostril.  Plain Allegra (NOT D )  160 daily , Loratidine 10 mg , OR Zyrtec 10 mg @ bedtime  as needed for itchy eyes & sneezing.

## 2014-11-07 NOTE — Progress Notes (Signed)
   Subjective:    Patient ID: Jaime Young, female    DOB: 1951/03/29, 64 y.o.   MRN: [id number]  HPI   Her symptoms began 10/30/14 as a "cold". Symptoms progressed and as of 5/21-22 she was bedridden with high fevers up to 101F. She used Benadryl with no response. She's developed pain in the right ear. She also has clear postnasal drainage. She describes itchy eyes. She also has a sore throat from the cough.  She has no history of asthma. She quit smoking over 3 decades ago.  Review of Systems  She denies frontal headache, facial pain, nasal purulence, shortness of breath, wheezing. She also is not having sneezing.      Objective:   Physical Exam  General appearance:Adequately nourished; no acute distress or increased work of breathing is present.    Lymphatic: No  lymphadenopathy about the head, neck, or axilla .  Eyes: No conjunctival inflammation or lid edema is present. There is no scleral icterus.  Ears:  External ear exam shows no significant lesions or deformities.  Otoscopic examination reveals clear canals, tympanic membranes are intact bilaterally without bulging, retraction, inflammation or discharge. Tympanic membranes are dull Nose:  External nasal examination shows no deformity or inflammation. Nasal mucosa are dry and erythematous,especially on the right without lesions or exudates No septal dislocation or deviation.No obstruction to airflow.   Oral exam: Dental hygiene is good; lips and gums are healthy appearing.There is no oropharyngeal erythema or exudate . Profoundly hoarse. Some stridorous character to her respirations. Neck:  No deformities, thyromegaly, masses, or tenderness noted.   Supple with full range of motion without pain.   Heart:  Normal rate and regular rhythm. S1 and S2 normal without gallop, murmur, click, rub or other extra sounds.   Lungs: Scattered rhonchi and wheezes particularly in the anterior chest which are symmetric.  Extremities:  No  cyanosis, edema, or clubbing  noted    Skin: Warm & dry w/o tenting or jaundice. No significant lesions or rash.       Assessment & Plan:  #1 asthmatic bronchitis  Plan: See orders and recommendations

## 2014-11-07 NOTE — Progress Notes (Signed)
Pre visit review using our clinic review tool, if applicable. No additional management support is needed unless otherwise documented below in the visit note. 

## 2014-11-28 ENCOUNTER — Other Ambulatory Visit (INDEPENDENT_AMBULATORY_CARE_PROVIDER_SITE_OTHER): Payer: BLUE CROSS/BLUE SHIELD

## 2014-11-28 ENCOUNTER — Ambulatory Visit (INDEPENDENT_AMBULATORY_CARE_PROVIDER_SITE_OTHER): Payer: BLUE CROSS/BLUE SHIELD | Admitting: Internal Medicine

## 2014-11-28 ENCOUNTER — Encounter: Payer: Self-pay | Admitting: Internal Medicine

## 2014-11-28 VITALS — BP 116/72 | HR 76 | Temp 98.1°F | Ht 62.0 in | Wt 133.0 lb

## 2014-11-28 DIAGNOSIS — Z0000 Encounter for general adult medical examination without abnormal findings: Secondary | ICD-10-CM

## 2014-11-28 LAB — URINALYSIS, ROUTINE W REFLEX MICROSCOPIC
Bilirubin Urine: NEGATIVE
Hgb urine dipstick: NEGATIVE
KETONES UR: NEGATIVE
LEUKOCYTES UA: NEGATIVE
Nitrite: NEGATIVE
PH: 7 (ref 5.0–8.0)
SPECIFIC GRAVITY, URINE: 1.01 (ref 1.000–1.030)
Total Protein, Urine: NEGATIVE
Urine Glucose: NEGATIVE
Urobilinogen, UA: 0.2 (ref 0.0–1.0)

## 2014-11-28 LAB — LIPID PANEL
Cholesterol: 234 mg/dL — ABNORMAL HIGH (ref 0–200)
HDL: 54.2 mg/dL (ref >39.00)
LDL Cholesterol: 152 mg/dL — ABNORMAL HIGH (ref 0–99)
NonHDL: 179.8
TRIGLYCERIDES: 137 mg/dL (ref 0.0–149.0)
Total CHOL/HDL Ratio: 4
VLDL: 27.4 mg/dL (ref 0.0–40.0)

## 2014-11-28 LAB — TSH: TSH: 2.49 u[IU]/mL (ref 0.35–4.50)

## 2014-11-28 NOTE — Progress Notes (Signed)
Subjective:    Patient ID: Jaime Young, female    DOB: [birth year], 64 y.o.   MRN: [id number]  HPI  Here for wellness and f/u;  Overall doing ok;  Pt denies Chest pain, worsening SOB, DOE, wheezing, orthopnea, PND, worsening LE edema, palpitations, dizziness or syncope.  Pt denies neurological change such as new headache, facial or extremity weakness.  Pt denies polydipsia, polyuria, or low sugar symptoms. Pt states overall good compliance with treatment and medications, good tolerability, and has been trying to follow appropriate diet.  Pt denies worsening depressive symptoms, suicidal ideation or panic. No fever, night sweats, wt loss, loss of appetite, or other constitutional symptoms.  Pt states good ability with ADL's, has low fall risk, home safety reviewed and adequate, no other significant changes in hearing or vision, and trying to stay active with exercise - wlaks every  Am for 2 miles. Asthamatic bronchitis improved. Past Medical History  Diagnosis Date  . Osteopenia 09/18/2010  . Impaired glucose tolerance 11/17/2010  . Hyperlipidemia   . Anxiety   . Allergic rhinitis   . DJD (degenerative joint disease) of lumbar spine   . Diverticulitis   . Diverticulosis of colon   . Migraine   . Raynaud's disease   . DVT (deep venous thrombosis) 1977  . RADIAL NERVE INJURY   . RAYNAUD'S DISEASE   . SUPERFICIAL PHLEBITIS 09/01/2007  . Cancer     stage I right breast cancer  . Post-operative nausea and vomiting   . 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 Total abdominal hysterectomy w/ bilateral salpingoophorectomy  1983    secondary to edometriosis  . Back surgery  2002  . Hemorrhoid surgery  1983  . Tonsillectomy    . Breast surgery  1989/2001    implants  . Breast lumpectomy Right 2011    axillary node dissection  . Pars plana vitrectomy Left 2013    w/Membranectomy    reports that she has quit smoking. She has never  used smokeless tobacco. She reports that she does not drink alcohol or use illicit drugs. family history includes Bone cancer in her other; Breast cancer in her other; Cancer in her father and sister; Esophageal cancer in her father; Heart disease in her father; Hypertension in her mother; Stomach cancer in her other and sister; Stroke in her mother. There is no history of Colon cancer or Rectal cancer. Allergies  Allergen Reactions  . Ciprofloxacin Other (See Comments)    Pt denies  . Codeine Nausea Only  . Penicillins Hives  . Sulfonamide Derivatives Nausea Only  . Aflibercept Rash   Current Outpatient Prescriptions on File Prior to Visit  Medication Sig Dispense Refill  . brimonidine-timolol (COMBIGAN) 0.2-0.5 % ophthalmic solution Place 1 drop into both eyes at bedtime.     . Calcium-Magnesium-Vitamin D (CALCIUM 500) 500-250-200 MG-MG-UNIT TABS Take 3 tablets by mouth daily.    . Multiple Vitamins tablet Take 1 tablet by mouth daily.    . Omega-3 Fatty Acids (FISH OIL) 300 MG CAPS Take 1 capsule by mouth daily.    . tamoxifen (NOLVADEX) 20 MG tablet Take 1 tablet (20 mg total) by mouth daily. 90 tablet 3  . vitamin C (ASCORBIC ACID) 500 MG tablet Take 1,000 mg by mouth daily.    . vitamin E (VITAMIN E) 400 UNIT capsule Take 400 Units by mouth daily.    Marland Kitchen azithromycin (  ZITHROMAX Z-PAK) 250 MG tablet 2 day 1, then 1 qd (Patient not taking: Reported on 11/28/2014) 6 tablet 0  . potassium chloride (KLOR-CON M10) 10 MEQ tablet Take 1 tablet (10 mEq total) by mouth daily. (Patient not taking: Reported on 11/28/2014) 90 tablet 3  . predniSONE (DELTASONE) 20 MG tablet Take 1 tablet (20 mg total) by mouth 2 (two) times daily. (Patient not taking: Reported on 11/28/2014) 14 tablet 0   No current facility-administered medications on file prior to visit.    Review of Systems Constitutional: Negative for increased diaphoresis, other activity, appetite or siginficant weight change other than  noted HENT: Negative for worsening hearing loss, ear pain, facial swelling, mouth sores and neck stiffness.   Eyes: Negative for other worsening pain, redness or visual disturbance.  Respiratory: Negative for shortness of breath and wheezing  Cardiovascular: Negative for chest pain and palpitations.  Gastrointestinal: Negative for diarrhea, blood in stool, abdominal distention or other pain Genitourinary: Negative for hematuria, flank pain or change in urine volume.  Musculoskeletal: Negative for myalgias or other joint complaints.  Skin: Negative for color change and wound or drainage.  Neurological: Negative for syncope and numbness. other than noted Hematological: Negative for adenopathy. or other swelling Psychiatric/Behavioral: Negative for hallucinations, SI, self-injury, decreased concentration or other worsening agitation.      Objective:   Physical Exam BP 116/72 mmHg  Pulse 76  Temp(Src) 98.1 F (36.7 C) (Oral)  Ht 5\' 2"  (1.575 m)  Wt 133 lb (60.328 kg)  BMI 24.32 kg/m2  SpO2 99% VS noted,  Constitutional: Pt is oriented to person, place, and time. Appears well-developed and well-nourished, in no significant distress Head: Normocephalic and atraumatic.  Right Ear: External ear normal.  Left Ear: External ear normal.  Nose: Nose normal.  Mouth/Throat: Oropharynx is clear and moist.  Eyes: Conjunctivae and EOM are normal. Pupils are equal, round, and reactive to light.  Neck: Normal range of motion. Neck supple. No JVD present. No tracheal deviation present or significant neck LA or mass Cardiovascular: Normal rate, regular rhythm, normal heart sounds and intact distal pulses.   Pulmonary/Chest: Effort normal and breath sounds without rales or wheezing  Abdominal: Soft. Bowel sounds are normal. NT. No HSM  Musculoskeletal: Normal range of motion. Exhibits no edema.  Lymphadenopathy:  Has no cervical adenopathy.  Neurological: Pt is alert and oriented to person, place,  and time. Pt has normal reflexes. No cranial nerve deficit. Motor grossly intact Skin: Skin is warm and dry. No rash noted.  Psychiatric:  Has normal mood and affect. Behavior is normal.      Assessment & Plan:

## 2014-11-28 NOTE — Assessment & Plan Note (Signed)
Overall doing well, age appropriate education and counseling updated, referrals for preventative services and immunizations addressed, dietary and smoking counseling addressed, most recent labs reviewed.  I have personally reviewed and have noted:  1) the patient's medical and social history 2) The pt's use of alcohol, tobacco, and illicit drugs 3) The patient's current medications and supplements 4) Functional ability including ADL's, fall risk, home safety risk, hearing and visual impairment 5) Diet and physical activities 6) Evidence for depression or mood disorder 7) The patient's height, weight, and BMI have been recorded in the chart  I have made referrals, and provided counseling and education based on review of the above For labs today Lab Results  Component Value Date   WBC 3.3* 09/11/2014   HGB 12.8 09/11/2014   HCT 38.1 09/11/2014   PLT 154 09/11/2014   GLUCOSE 84 09/11/2014   CHOL 191 07/05/2012   TRIG 172.0* 07/05/2012   HDL 43.60 07/05/2012   LDLDIRECT 160.6 12/20/2007   LDLCALC 113* 07/05/2012   ALT 17 09/11/2014   AST 20 09/11/2014   NA 147* 09/11/2014   K 3.9 09/11/2014   CL 102 10/29/2013   CREATININE 0.8 09/11/2014   BUN 9.5 09/11/2014   CO2 29 09/11/2014   TSH 3.40 07/05/2012   INR 1.00 02/04/2010

## 2014-11-28 NOTE — Patient Instructions (Signed)

## 2014-11-28 NOTE — Progress Notes (Signed)
Pre visit review using our clinic review tool, if applicable. No additional management support is needed unless otherwise documented below in the visit note. 

## 2015-03-08 DIAGNOSIS — H2511 Age-related nuclear cataract, right eye: Secondary | ICD-10-CM | POA: Diagnosis not present

## 2015-03-08 DIAGNOSIS — Z961 Presence of intraocular lens: Secondary | ICD-10-CM | POA: Diagnosis not present

## 2015-03-08 DIAGNOSIS — H35372 Puckering of macula, left eye: Secondary | ICD-10-CM | POA: Diagnosis not present

## 2015-03-08 DIAGNOSIS — H3581 Retinal edema: Secondary | ICD-10-CM | POA: Diagnosis not present

## 2015-04-16 DIAGNOSIS — D2272 Melanocytic nevi of left lower limb, including hip: Secondary | ICD-10-CM | POA: Diagnosis not present

## 2015-04-16 DIAGNOSIS — D2262 Melanocytic nevi of left upper limb, including shoulder: Secondary | ICD-10-CM | POA: Diagnosis not present

## 2015-04-16 DIAGNOSIS — D225 Melanocytic nevi of trunk: Secondary | ICD-10-CM | POA: Diagnosis not present

## 2015-04-16 DIAGNOSIS — D2271 Melanocytic nevi of right lower limb, including hip: Secondary | ICD-10-CM | POA: Diagnosis not present

## 2015-04-16 DIAGNOSIS — H0265 Xanthelasma of left lower eyelid: Secondary | ICD-10-CM | POA: Diagnosis not present

## 2015-04-16 DIAGNOSIS — D2261 Melanocytic nevi of right upper limb, including shoulder: Secondary | ICD-10-CM | POA: Diagnosis not present

## 2015-04-16 DIAGNOSIS — L821 Other seborrheic keratosis: Secondary | ICD-10-CM | POA: Diagnosis not present

## 2015-07-31 ENCOUNTER — Encounter: Payer: Self-pay | Admitting: Internal Medicine

## 2015-07-31 ENCOUNTER — Ambulatory Visit (INDEPENDENT_AMBULATORY_CARE_PROVIDER_SITE_OTHER): Payer: Medicare Other | Admitting: Internal Medicine

## 2015-07-31 VITALS — BP 124/80 | HR 66 | Temp 98.1°F | Ht 62.0 in | Wt 134.0 lb

## 2015-07-31 DIAGNOSIS — R7302 Impaired glucose tolerance (oral): Secondary | ICD-10-CM | POA: Diagnosis not present

## 2015-07-31 DIAGNOSIS — J019 Acute sinusitis, unspecified: Secondary | ICD-10-CM

## 2015-07-31 DIAGNOSIS — F411 Generalized anxiety disorder: Secondary | ICD-10-CM | POA: Diagnosis not present

## 2015-07-31 MED ORDER — LEVOFLOXACIN 250 MG PO TABS: 250.0000 mg | ORAL_TABLET | Freq: Every day | ORAL | Status: DC

## 2015-07-31 MED ORDER — DOXYCYCLINE HYCLATE 100 MG PO TABS: 100.0000 mg | ORAL_TABLET | Freq: Two times a day (BID) | ORAL | Status: DC

## 2015-07-31 MED ORDER — HYDROCODONE-HOMATROPINE 5-1.5 MG/5ML PO SYRP: 5.0000 mL | ORAL_SOLUTION | Freq: Four times a day (QID) | ORAL | Status: DC | PRN

## 2015-07-31 NOTE — Progress Notes (Signed)
Subjective:    Patient ID: Jaime Young, female    DOB: May 29, 1951, 65 y.o.   MRN: [id number]  HPI   Here with 9 days acute onset fever, facial pain, pressure, headache, general weakness and malaise, with mild ST and non prod cough, some hoarseness but pt denies chest pain, wheezing, increased sob or doe, orthopnea, PND, increased LE swelling, palpitations, dizziness or syncope.  Pt denies new neurological symptoms such as new headache, or facial or extremity weakness or numbness   Pt denies polydipsia, polyuria  Denies worsening depressive symptoms, suicidal ideation, or panic Past Medical History  Diagnosis Date  . Osteopenia 09/18/2010  . Impaired glucose tolerance 11/17/2010  . Hyperlipidemia   . Anxiety   . Allergic rhinitis   . DJD (degenerative joint disease) of lumbar spine   . Diverticulitis   . Diverticulosis of colon   . Migraine   . Raynaud's disease   . DVT (deep venous thrombosis) ([place]) 1977  . RADIAL NERVE INJURY   . RAYNAUD'S DISEASE   . SUPERFICIAL PHLEBITIS 09/01/2007  . Cancer ([place])     stage I right breast cancer  . Post-operative nausea and vomiting   . 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 Total abdominal hysterectomy w/ bilateral salpingoophorectomy  1983    secondary to edometriosis  . Back surgery  2002  . Hemorrhoid surgery  1983  . Tonsillectomy    . Breast surgery  1989/2001    implants  . Breast lumpectomy Right 2011    axillary node dissection  . Pars plana vitrectomy Left 2013    w/Membranectomy    reports that she has quit smoking. She has never used smokeless tobacco. She reports that she does not drink alcohol or use illicit drugs. family history includes Bone cancer in her other; Breast cancer in her other; Cancer in her father and sister; Esophageal cancer in her father; Heart disease in her father; Hypertension in her mother; Stomach cancer in her other and sister; Stroke in her  mother. There is no history of Colon cancer or Rectal cancer. Allergies  Allergen Reactions  . Dextromethorphan Swelling  . Ciprofloxacin Other (See Comments)    Pt denies  . Codeine Nausea Only  . Penicillins Hives  . Sulfonamide Derivatives Nausea Only  . Aflibercept Rash   Current Outpatient Prescriptions on File Prior to Visit  Medication Sig Dispense Refill  . brimonidine-timolol (COMBIGAN) 0.2-0.5 % ophthalmic solution Place 1 drop into both eyes at bedtime.     . Calcium-Magnesium-Vitamin D (CALCIUM 500) 500-250-200 MG-MG-UNIT TABS Take 3 tablets by mouth daily.    . Multiple Vitamins tablet Take 1 tablet by mouth daily.    . Omega-3 Fatty Acids (FISH OIL) 300 MG CAPS Take 1 capsule by mouth daily.    . potassium chloride (KLOR-CON M10) 10 MEQ tablet Take 1 tablet (10 mEq total) by mouth daily. 90 tablet 3  . tamoxifen (NOLVADEX) 20 MG tablet Take 1 tablet (20 mg total) by mouth daily. 90 tablet 3  . vitamin C (ASCORBIC ACID) 500 MG tablet Take 1,000 mg by mouth daily.    . vitamin E (VITAMIN E) 400 UNIT capsule Take 400 Units by mouth daily.     No current facility-administered medications on file prior to visit.   Review of Systems  Constitutional: Negative for unusual diaphoresis or night sweats HENT: Negative for ringing in ear or discharge  Eyes: Negative for double vision or worsening visual disturbance.  Respiratory: Negative for choking and stridor.   Gastrointestinal: Negative for vomiting or other signifcant bowel change Genitourinary: Negative for hematuria or change in urine volume.  Musculoskeletal: Negative for other MSK pain or swelling Skin: Negative for color change and worsening wound.  Neurological: Negative for tremors and numbness other than noted  Psychiatric/Behavioral: Negative for decreased concentration or agitation other than above       Objective:   Physical Exam BP 124/80 mmHg  Pulse 66  Temp(Src) 98.1 F (36.7 C) (Oral)  Ht 5\' 2"  (1.575  m)  Wt 134 lb (60.782 kg)  BMI 24.50 kg/m2  SpO2 97% VS noted, non toxic Constitutional: Pt appears in no significant distress HENT: Head: NCAT.  Right Ear: External ear normal.  Left Ear: External ear normal.  Eyes: . Pupils are equal, round, and reactive to light. Conjunctivae and EOM are normal Bilat tm's with mild erythema.  Max sinus areas mod tender.  Pharynx with mild erythema, no exudate Neck: Normal range of motion. Neck supple. with bilat submandib LA tender Cardiovascular: Normal rate and regular rhythm.   Pulmonary/Chest: Effort normal and breath sounds without rales or wheezing.  Neurological: Pt is alert. Not confused , motor grossly intact Skin: Skin is warm. No rash, no LE edema Psychiatric: Pt behavior is normal. No agitation. mild nervous    Assessment & Plan:

## 2015-07-31 NOTE — Assessment & Plan Note (Signed)
stable overall by history and exam, and pt to continue medical treatment as before,  to f/u any worsening symptoms or concerns 

## 2015-07-31 NOTE — Assessment & Plan Note (Signed)
stable overall by history and exam, recent data reviewed with pt, and pt to continue medical treatment as before,  to f/u any worsening symptoms or concerns Lab Results  Component Value Date   WBC 3.3* 09/11/2014   HGB 12.8 09/11/2014   HCT 38.1 09/11/2014   PLT 154 09/11/2014   GLUCOSE 84 09/11/2014   CHOL 234* 11/28/2014   TRIG 137.0 11/28/2014   HDL 54.20 11/28/2014   LDLDIRECT 160.6 12/20/2007   LDLCALC 152* 11/28/2014   ALT 17 09/11/2014   AST 20 09/11/2014   NA 147* 09/11/2014   K 3.9 09/11/2014   CL 102 10/29/2013   CREATININE 0.8 09/11/2014   BUN 9.5 09/11/2014   CO2 29 09/11/2014   TSH 2.49 11/28/2014   INR 1.00 02/04/2010   Pt to monitor for onset polys or cbg > 200 with illness

## 2015-07-31 NOTE — Patient Instructions (Signed)
Please take all new medication as prescribed - the antibiotic (doxycycline) , and the cough medicine  Please continue all other medications as before, and refills have been done if requested.  Please have the pharmacy call with any other refills you may need.  Please keep your appointments with your specialists as you may have planned

## 2015-07-31 NOTE — Assessment & Plan Note (Signed)
Mild to mod, for antibx course,  to f/u any worsening symptoms or concerns 

## 2015-07-31 NOTE — Progress Notes (Signed)
Pre visit review using our clinic review tool, if applicable. No additional management support is needed unless otherwise documented below in the visit note. 

## 2015-08-02 ENCOUNTER — Encounter (HOSPITAL_COMMUNITY): Payer: Self-pay | Admitting: Emergency Medicine

## 2015-08-02 ENCOUNTER — Emergency Department (HOSPITAL_COMMUNITY): Payer: Medicare Other

## 2015-08-02 ENCOUNTER — Emergency Department (HOSPITAL_COMMUNITY)
Admission: EM | Admit: 2015-08-02 | Discharge: 2015-08-02 | Disposition: A | Discharge status: Home / Self Care | Payer: Medicare Other | Attending: Emergency Medicine | Admitting: Emergency Medicine

## 2015-08-02 ENCOUNTER — Telehealth: Payer: Self-pay | Admitting: Internal Medicine

## 2015-08-02 ENCOUNTER — Ambulatory Visit (INDEPENDENT_AMBULATORY_CARE_PROVIDER_SITE_OTHER): Payer: Medicare Other | Admitting: Family Medicine

## 2015-08-02 VITALS — BP 104/67 | HR 92 | Temp 98.7°F | Resp 16 | Ht 62.0 in | Wt 130.0 lb

## 2015-08-02 DIAGNOSIS — H9193 Unspecified hearing loss, bilateral: Secondary | ICD-10-CM | POA: Diagnosis not present

## 2015-08-02 DIAGNOSIS — R112 Nausea with vomiting, unspecified: Secondary | ICD-10-CM | POA: Diagnosis not present

## 2015-08-02 DIAGNOSIS — Z86718 Personal history of other venous thrombosis and embolism: Secondary | ICD-10-CM | POA: Diagnosis not present

## 2015-08-02 DIAGNOSIS — J3489 Other specified disorders of nose and nasal sinuses: Secondary | ICD-10-CM | POA: Diagnosis not present

## 2015-08-02 DIAGNOSIS — Z880 Allergy status to penicillin: Secondary | ICD-10-CM | POA: Insufficient documentation

## 2015-08-02 DIAGNOSIS — Z87891 Personal history of nicotine dependence: Secondary | ICD-10-CM | POA: Insufficient documentation

## 2015-08-02 DIAGNOSIS — Z853 Personal history of malignant neoplasm of breast: Secondary | ICD-10-CM | POA: Insufficient documentation

## 2015-08-02 DIAGNOSIS — J0100 Acute maxillary sinusitis, unspecified: Secondary | ICD-10-CM | POA: Insufficient documentation

## 2015-08-02 DIAGNOSIS — Z8719 Personal history of other diseases of the digestive system: Secondary | ICD-10-CM | POA: Insufficient documentation

## 2015-08-02 DIAGNOSIS — Z7952 Long term (current) use of systemic steroids: Secondary | ICD-10-CM | POA: Insufficient documentation

## 2015-08-02 DIAGNOSIS — M8580 Other specified disorders of bone density and structure, unspecified site: Secondary | ICD-10-CM | POA: Insufficient documentation

## 2015-08-02 DIAGNOSIS — E785 Hyperlipidemia, unspecified: Secondary | ICD-10-CM | POA: Insufficient documentation

## 2015-08-02 DIAGNOSIS — R232 Flushing: Secondary | ICD-10-CM

## 2015-08-02 DIAGNOSIS — R220 Localized swelling, mass and lump, head: Secondary | ICD-10-CM

## 2015-08-02 DIAGNOSIS — Z87828 Personal history of other (healed) physical injury and trauma: Secondary | ICD-10-CM | POA: Diagnosis not present

## 2015-08-02 DIAGNOSIS — Z792 Long term (current) use of antibiotics: Secondary | ICD-10-CM | POA: Insufficient documentation

## 2015-08-02 DIAGNOSIS — T7840XA Allergy, unspecified, initial encounter: Secondary | ICD-10-CM

## 2015-08-02 DIAGNOSIS — Z8679 Personal history of other diseases of the circulatory system: Secondary | ICD-10-CM | POA: Insufficient documentation

## 2015-08-02 DIAGNOSIS — E876 Hypokalemia: Secondary | ICD-10-CM | POA: Diagnosis not present

## 2015-08-02 DIAGNOSIS — M47896 Other spondylosis, lumbar region: Secondary | ICD-10-CM | POA: Diagnosis not present

## 2015-08-02 DIAGNOSIS — Z79899 Other long term (current) drug therapy: Secondary | ICD-10-CM | POA: Insufficient documentation

## 2015-08-02 DIAGNOSIS — R21 Rash and other nonspecific skin eruption: Secondary | ICD-10-CM | POA: Diagnosis not present

## 2015-08-02 LAB — COMPREHENSIVE METABOLIC PANEL
ALT: 46 U/L (ref 14–54)
ANION GAP: 14 (ref 5–15)
AST: 54 U/L — ABNORMAL HIGH (ref 15–41)
Albumin: 4.3 g/dL (ref 3.5–5.0)
Alkaline Phosphatase: 50 U/L (ref 38–126)
BUN: 14 mg/dL (ref 6–20)
CHLORIDE: 102 mmol/L (ref 101–111)
CO2: 25 mmol/L (ref 22–32)
CREATININE: 0.68 mg/dL (ref 0.44–1.00)
Calcium: 9.4 mg/dL (ref 8.9–10.3)
Glucose, Bld: 99 mg/dL (ref 65–99)
POTASSIUM: 2.9 mmol/L — AB (ref 3.5–5.1)
SODIUM: 141 mmol/L (ref 135–145)
Total Bilirubin: 1 mg/dL (ref 0.3–1.2)
Total Protein: 7.5 g/dL (ref 6.5–8.1)

## 2015-08-02 LAB — CBC WITH DIFFERENTIAL/PLATELET
BASOS PCT: 0 %
Basophils Absolute: 0 10*3/uL (ref 0.0–0.1)
EOS ABS: 0 10*3/uL (ref 0.0–0.7)
Eosinophils Relative: 0 %
HCT: 42.4 % (ref 36.0–46.0)
HEMOGLOBIN: 14.4 g/dL (ref 12.0–15.0)
Lymphocytes Relative: 27 %
Lymphs Abs: 1.8 10*3/uL (ref 0.7–4.0)
MCH: 31 pg (ref 26.0–34.0)
MCHC: 34 g/dL (ref 30.0–36.0)
MCV: 91.4 fL (ref 78.0–100.0)
Monocytes Absolute: 0.5 10*3/uL (ref 0.1–1.0)
Monocytes Relative: 8 %
NEUTROS ABS: 4.3 10*3/uL (ref 1.7–7.7)
NEUTROS PCT: 65 %
Platelets: 250 10*3/uL (ref 150–400)
RBC: 4.64 MIL/uL (ref 3.87–5.11)
RDW: 12 % (ref 11.5–15.5)
WBC: 6.6 10*3/uL (ref 4.0–10.5)

## 2015-08-02 MED ORDER — ONDANSETRON HCL 4 MG/2ML IJ SOLN
4.0000 mg | Freq: Once | INTRAMUSCULAR | Status: AC
  Administered 2015-08-02: 4 mg via INTRAVENOUS
  Filled 2015-08-02: qty 2

## 2015-08-02 MED ORDER — ONDANSETRON 8 MG PO TBDP: 8.0000 mg | ORAL_TABLET | Freq: Three times a day (TID) | ORAL | Status: DC | PRN

## 2015-08-02 MED ORDER — DIPHENHYDRAMINE HCL 50 MG/ML IJ SOLN
12.5000 mg | Freq: Once | INTRAMUSCULAR | Status: AC
  Administered 2015-08-02: 12.5 mg via INTRAVENOUS
  Filled 2015-08-02: qty 1

## 2015-08-02 MED ORDER — POTASSIUM CHLORIDE CRYS ER 20 MEQ PO TBCR
40.0000 meq | EXTENDED_RELEASE_TABLET | Freq: Once | ORAL | Status: AC
  Administered 2015-08-02: 40 meq via ORAL
  Filled 2015-08-02: qty 2

## 2015-08-02 MED ORDER — CLARITHROMYCIN 500 MG PO TABS: 500.0000 mg | ORAL_TABLET | Freq: Two times a day (BID) | ORAL | Status: DC

## 2015-08-02 MED ORDER — SODIUM CHLORIDE 0.9 % IV BOLUS (SEPSIS)
1000.0000 mL | Freq: Once | INTRAVENOUS | Status: AC
  Administered 2015-08-02: 1000 mL via INTRAVENOUS

## 2015-08-02 MED ORDER — POTASSIUM CHLORIDE ER 10 MEQ PO TBCR
20.0000 meq | EXTENDED_RELEASE_TABLET | Freq: Every day | ORAL | Status: DC
Start: 2015-08-02 — End: 2015-09-25

## 2015-08-02 MED ORDER — ONDANSETRON 4 MG PO TBDP
4.0000 mg | ORAL_TABLET | Freq: Once | ORAL | Status: AC
  Administered 2015-08-02: 4 mg via ORAL

## 2015-08-02 NOTE — Telephone Encounter (Signed)
Patient Name: Jaime Young  DOB: Oct 31, 1950    Initial Comment Caller states, mother was given Rx for sinus infection on Tues, the cough medication has made her vomit    Nurse Assessment  Nurse: Mallie Mussel, RN, Alveta Heimlich Date/Time Eilene Ghazi Time): 08/02/2015 11:45:20 AM  Confirm and document reason for call. If symptomatic, describe symptoms. You must click the next button to save text entered. ---Caller states that her mother was seen Tuesday and was prescribed Doxycycline and Hycydan for sinus infection and cough. She throws up violently when she has anything with a narcotic in it. She began vomiting yesterday. She has been vomiting for "2 whole days." Her face is very red and puffy. Her face is nearly purple. She last urinated this morning but only a little bit. Her mouth is dry. She feels like she has a fever.  Has the patient traveled out of the country within the last 30 days? ---No  Does the patient have any new or worsening symptoms? ---Yes  Will a triage be completed? ---Yes  Related visit to physician within the last 2 weeks? ---Yes  Does the PT have any chronic conditions? (i.e. diabetes, asthma, etc.) ---Yes  List chronic conditions. ---Breast CA >5 years ago  Is this a behavioral health or substance abuse call? ---No     Guidelines    Guideline Title Affirmed Question Affirmed Notes  Vomiting [1] SEVERE vomiting (e.g., 6 or more times/day) AND [2] present > 8 hours    Final Disposition User   Go to ED Now (or PCP triage) Mallie Mussel, RN, Alveta Heimlich    Comments  Caller is not with her mother at this time. She can be there in a couple minutes. She can then be reached at [phone].  Caller chose UC over ER. I had advised her that she is well on her way to dehydration. Caller states that the UC she has gone to before will do IV fluids and she will take her there instead of the ER.   Referrals  [facility] - [facility]   Disagree/Comply: Comply

## 2015-08-02 NOTE — ED Notes (Signed)
MD at bedside. 

## 2015-08-02 NOTE — ED Provider Notes (Signed)
CSN: [id number]     Arrival date & time 08/02/15  1516 History   First MD Initiated Contact with Patient 08/02/15 1525     Chief Complaint  Patient presents with  . Allergic Reaction  . Emesis    HPI Patient states she's had URI symptoms for the last week. This has consisted of sinus congestion, cough, sore throat, nasal congestion. Patient saw her primary doctor on the 14th. She was given a prescription for doxycycline and a cough medicine, hydrocodone homatropine.  Patient states that since that time she started having trouble with nausea and vomiting. She's had multiple episodes will last 2 days. She feels like her mouth is very dry in her throat is getting sore. This morning she started getting redness on her cheeks and her face. She felt that she was having a reaction to the medications. She went and saw an urgent care today who felt like she needed further treatment and was sent to the emergency room.  She was given a dose of Zofran prior to arrival. Past Medical History  Diagnosis Date  . Osteopenia 09/18/2010  . Impaired glucose tolerance 11/17/2010  . Hyperlipidemia   . Anxiety   . Allergic rhinitis   . DJD (degenerative joint disease) of lumbar spine   . Diverticulitis   . Diverticulosis of colon   . Migraine   . Raynaud's disease   . DVT (deep venous thrombosis) ([place]) 1977  . RADIAL NERVE INJURY   . RAYNAUD'S DISEASE   . SUPERFICIAL PHLEBITIS 09/01/2007  . Cancer ([place])     stage I right breast cancer  . Post-operative nausea and vomiting   . 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 Total abdominal hysterectomy w/ bilateral salpingoophorectomy  1983    secondary to edometriosis  . Back surgery  2002  . Hemorrhoid surgery  1983  . Tonsillectomy    . Breast surgery  1989/2001    implants  . Breast lumpectomy Right 2011    axillary node dissection  . Pars plana vitrectomy Left 2013    w/Membranectomy   Family  History  Problem Relation Age of Onset  . Stroke Mother   . Hypertension Mother   . Heart disease Father   . Cancer Father   . Esophageal cancer Father   . Cancer Sister   . Stomach cancer Sister   . Breast cancer Other   . Bone cancer Other   . Stomach cancer Other   . Colon cancer Neg Hx   . Rectal cancer Neg Hx    Social History  Substance Use Topics  . Smoking status: Former Research scientist (life sciences)  . Smokeless tobacco: Never Used  . Alcohol Use: No   OB History    No data available     Review of Systems  All other systems reviewed and are negative.     Allergies  Dextromethorphan; Ciprofloxacin; Codeine; Hydrocodone; Penicillins; Sulfonamide derivatives; and Aflibercept  Home Medications   Prior to Admission medications   Medication Sig Start Date End Date Taking? Authorizing Provider  brimonidine-timolol (COMBIGAN) 0.2-0.5 % ophthalmic solution Place 1 drop into both eyes at bedtime.    Yes Historical Provider, MD  Calcium-Magnesium-Vitamin D (CALCIUM 500) [id number] MG-MG-UNIT TABS Take 3 tablets by mouth daily.   Yes Historical Provider, MD  doxycycline (VIBRA-TABS) 100 MG tablet Take 1 tablet (100 mg total) by mouth 2 (two) times daily. 07/31/15  Yes Jeneen Rinks  Quin Hoop, MD  Multiple Vitamins tablet Take 1 tablet by mouth daily. Reported on 08/02/2015   Yes Historical Provider, MD  Omega-3 Fatty Acids (FISH OIL) 300 MG CAPS Take 1 capsule by mouth daily.   Yes Historical Provider, MD  prednisoLONE acetate (PRED FORTE) 1 % ophthalmic suspension Place 1 drop into the left eye every morning.   Yes Historical Provider, MD  tamoxifen (NOLVADEX) 20 MG tablet Take 1 tablet (20 mg total) by mouth daily. 09/11/14  Yes Laurie Panda, NP  vitamin C (ASCORBIC ACID) 500 MG tablet Take 1,000 mg by mouth daily.   Yes Historical Provider, MD  vitamin E (VITAMIN E) 400 UNIT capsule Take 400 Units by mouth daily.   Yes Historical Provider, MD  clarithromycin (BIAXIN) 500 MG tablet Take 1 tablet (500  mg total) by mouth 2 (two) times daily. 08/02/15   Dorie Rank, MD  HYDROcodone-homatropine [facility]) 5-1.5 MG/5ML syrup Take 5 mLs by mouth every 6 (six) hours as needed for cough. Patient not taking: Reported on 08/02/2015 07/31/15   Biagio Borg, MD  ondansetron (ZOFRAN ODT) 8 MG disintegrating tablet Take 1 tablet (8 mg total) by mouth every 8 (eight) hours as needed for nausea or vomiting. 08/02/15   Dorie Rank, MD  potassium chloride (K-DUR) 10 MEQ tablet Take 2 tablets (20 mEq total) by mouth daily. 08/02/15   Dorie Rank, MD   BP 131/74 mmHg  Pulse 90  Temp(Src) 98.5 F (36.9 C) (Oral)  Resp 20  SpO2 100% Physical Exam  HENT:  Head: Normocephalic and atraumatic.    Right Ear: External ear normal.  Left Ear: External ear normal.  Mucous membranes are moist, mild pharyngeal erythema, no edema no exudate  Eyes: Conjunctivae are normal. Right eye exhibits no discharge. Left eye exhibits no discharge. No scleral icterus.  Neck: Neck supple. No tracheal deviation present.  Cardiovascular: Normal rate, regular rhythm and intact distal pulses.   Pulmonary/Chest: Effort normal and breath sounds normal. No stridor. No respiratory distress. She has no wheezes. She has no rales.  Abdominal: Soft. Bowel sounds are normal. She exhibits no distension. There is no tenderness. There is no rebound and no guarding.  Musculoskeletal: She exhibits no edema or tenderness.  Neurological: She is alert. She has normal strength. No cranial nerve deficit (no facial droop, extraocular movements intact, no slurred speech) or sensory deficit. She exhibits normal muscle tone. She displays no seizure activity. Coordination normal.  Skin: Skin is warm and dry. Rash noted. She is not diaphoretic.  Erythema of the forehead and face, no urticaria  Psychiatric: She has a normal mood and affect.  Nursing note and vitals reviewed.   ED Course  Procedures (including critical care time) Labs Review Labs Reviewed    COMPREHENSIVE METABOLIC PANEL - Abnormal; Notable for the following:    Potassium 2.9 (*)    AST 54 (*)    All other components within normal limits  CBC WITH DIFFERENTIAL/PLATELET    Imaging Review Dg Chest 2 View  08/02/2015  CLINICAL DATA:  Sinus infection.  Antibiotic therapy. EXAM: CHEST  2 VIEW COMPARISON:  Nov 07, 2014 FINDINGS: Postsurgical changes over the right chest. The heart, hila, mediastinum, lungs, and pleura are otherwise unchanged and unremarkable. Mild streakiness in the medial right upper lung is unchanged, probably scarring. IMPRESSION: No active cardiopulmonary disease. Electronically Signed   By: Dorise Bullion III M.D   On: 08/02/2015 16:00   Ct Maxillofacial Wo Cm  08/02/2015  CLINICAL  DATA:  Facial swelling with history of recent sinus infection EXAM: CT MAXILLOFACIAL WITHOUT CONTRAST TECHNIQUE: Multidetector CT imaging of the maxillofacial structures was performed. Multiplanar CT image reconstructions were also generated. A small metallic BB was placed on the right temple in order to reliably differentiate right from left. COMPARISON:  None. FINDINGS: Paranasal sinuses are well aerated. Minimal mucosal thickening is noted within the maxillary antra bilaterally. No air-fluid level to suggest acute sinusitis is seen. No acute bony abnormality is noted. The orbits and their contents are within normal limits. No facial swelling is noted. Soft tissues are symmetrical in nature. IMPRESSION: Minimal mucosal thickening within the maxillary antra. No acute soft tissue or bony abnormality is noted. Electronically Signed   By: Inez Catalina M.D.   On: 08/02/2015 17:49   I have personally reviewed and evaluated these images and lab results as part of my medical decision-making.  Medications  potassium chloride SA (K-DUR,KLOR-CON) CR tablet 40 mEq (not administered)  sodium chloride 0.9 % bolus 1,000 mL (0 mLs Intravenous Stopped 08/02/15 1741)  diphenhydrAMINE (BENADRYL) injection  12.5 mg (12.5 mg Intravenous Given 08/02/15 1611)  ondansetron (ZOFRAN) injection 4 mg (4 mg Intravenous Given 08/02/15 1611)     MDM   Final diagnoses:  Non-intractable vomiting with nausea, vomiting of unspecified type  Acute maxillary sinusitis, recurrence not specified  Hypokalemia   1544  patient does not have evidence of angioedema or anaphylaxis. She may be having a reaction to the doxycycline and cough medication. Plan on laboratory tests IV fluids, chest x-ray and reassessment.   Patient improved with IV fluids and medications.  Potassium repleted.  No further vomiting.  Sx may be related to her illness as well as possibly side effects from the doxycycline and her cough medication. No discharge or home with a prescription for Zofran. I'll give her potassium replacement. I will also have her take Biaxin as opposed to the doxycycline since this may be contributing to the nausea and vomiting.    Dorie Rank, MD 08/02/15 [phone]

## 2015-08-02 NOTE — ED Notes (Signed)
Patient transported to CT 

## 2015-08-02 NOTE — ED Notes (Signed)
Pt reports she has been recently seen for a sinus infection. Saw PCP who prescribed doxycycline and hydrocodone. Pt complains of n/v for the past 2 days. This am began to have feeling of mouth swelling and dryness. Also began to have bilateral cheek redness and swelling this am.  Has been unable to keep down fluids for the past 2 days. Denies sob or itching.

## 2015-08-02 NOTE — Patient Instructions (Addendum)
After leaving our office, proceed directly to the emergency room.  If there are any worsening of symptoms on the way to the emergency room, call 911.  You were given Zofran 4 mg oral dissolvable tablet here to help start treatment for the nausea/vomiting, but will need further evaluation and treatment through the emergency room.   Drug Allergy Allergic reactions to medicines are common. Some allergic reactions are mild. A delayed type of drug allergy that occurs 1 week or more after exposure to a medicine or vaccine is called serum sickness. A life-threatening, sudden (acute) allergic reaction that involves the whole body is called anaphylaxis. CAUSES  "True" drug allergies occur when there is an allergic reaction to a medicine. This is caused by overactivity of the immune system. First, the body becomes sensitized. The immune system is triggered by your first exposure to the medicine. Following this first exposure, future exposure to the same medicine may be life-threatening. Almost any medicine can cause an allergic reaction. Common ones are:  Penicillin.  Sulfonamides (sulfa drugs).  Local anesthetics.  X-ray dyes that contain iodine. SYMPTOMS  Common symptoms of a minor allergic reaction are:  Swelling around the mouth.  An itchy red rash or hives.  Vomiting or diarrhea. Anaphylaxis can cause swelling of the mouth and throat. This makes it difficult to breathe and swallow. Severe reactions can be fatal within seconds, even after exposure to only a trace amount of the drug that causes the reaction. HOME CARE INSTRUCTIONS  If you are unsure of what caused your reaction, write down:  The names of the medicines you took.  How much medicine you took.  How you took the medicine, such as whether you took a pill, injected the medicine, or applied it to your skin.  All of the things you ate and drank.  The date and time of your reaction.  The symptoms of the reaction.  You may  want to follow up with an allergy specialist after the reaction has cleared in order to be tested to confirm the allergy. It is important to confirm that your reaction is an allergy, not just a side effect to the medicine. If you have a true allergy to a medicine, this may prevent that medicine and related medicines from being given to you when you are very ill.  If you have hives or a rash:  Take medicines as directed by your caregiver.  You may use an over-the-counter antihistamine (diphenhydramine) as needed.  Apply cold compresses to the skin or take baths in cool water. Avoid hot baths or showers.  If you are severely allergic:  Continuous observation after a severe reaction may be needed. Hospitalization is often required.  Wear a medical alert bracelet or necklace stating your allergy.  You and your family must learn how to use an anaphylaxis kit or give an epinephrine injection to temporarily treat an emergency allergic reaction. If you have had a severe reaction, always carry your epinephrine injection or anaphylaxis kit with you. This can be lifesaving if you have a severe reaction.  Do not drive or perform tasks after treatment until the medicines used to treat your reaction have worn off, or until your caregiver says it is okay.  If you have a drug allergy that was confirmed by your health care provider:  Carry information about the drug allergy with you at all times.  Always check with a pharmacist before taking any over-the-counter medicine. SEEK MEDICAL CARE IF:   You think  you had an allergic reaction. Symptoms usually start within 30 minutes after exposure.  Symptoms are getting worse rather than better.  You develop new symptoms.  The symptoms that brought you to your caregiver return. SEEK IMMEDIATE MEDICAL CARE IF:   You have swelling of the mouth, difficulty breathing, or wheezing.  You have a tight feeling in your chest or throat.  You develop hives,  swelling, or itching all over your body.  You develop severe vomiting or diarrhea.  You feel faint or pass out. This is an emergency. Use your epinephrine injection or anaphylaxis kit as you have been instructed. Call for emergency medical help. Even if you improve after the injection, you need to be examined at a hospital emergency department. MAKE SURE YOU:   Understand these instructions.  Will watch your condition.  Will get help right away if you are not doing well or get worse.   This information is not intended to replace advice given to you by your health care provider. Make sure you discuss any questions you have with your health care provider.   Document Released: 06/02/2005 Document Revised: 06/23/2014 Document Reviewed: 01/02/2015 Elsevier Interactive Patient Education [employer].

## 2015-08-02 NOTE — Discharge Instructions (Signed)
Hypokalemia Hypokalemia means that the amount of potassium in the blood is lower than normal.Potassium is a chemical, called an electrolyte, that helps regulate the amount of fluid in the body. It also stimulates muscle contraction and helps nerves function properly.Most of the body's potassium is inside of cells, and only a very small amount is in the blood. Because the amount in the blood is so small, minor changes can be life-threatening. CAUSES  Antibiotics.  Diarrhea or vomiting.  Using laxatives too much, which can cause diarrhea.  Chronic kidney disease.  Water pills (diuretics).  Eating disorders (bulimia).  Low magnesium level.  Sweating a lot. SIGNS AND SYMPTOMS  Weakness.  Constipation.  Fatigue.  Muscle cramps.  Mental confusion.  Skipped heartbeats or irregular heartbeat (palpitations).  Tingling or numbness. DIAGNOSIS  Your health care provider can diagnose hypokalemia with blood tests. In addition to checking your potassium level, your health care provider may also check other lab tests. TREATMENT Hypokalemia can be treated with potassium supplements taken by mouth or adjustments in your current medicines. If your potassium level is very low, you may need to get potassium through a vein (IV) and be monitored in the hospital. A diet high in potassium is also helpful. Foods high in potassium are:  Nuts, such as peanuts and pistachios.  Seeds, such as sunflower seeds and pumpkin seeds.  Peas, lentils, and lima beans.  Whole grain and bran cereals and breads.  Fresh fruit and vegetables, such as apricots, avocado, bananas, cantaloupe, kiwi, oranges, tomatoes, asparagus, and potatoes.  Orange and tomato juices.  Red meats.  Fruit yogurt. HOME CARE INSTRUCTIONS  Take all medicines as prescribed by your health care provider.  Maintain a healthy diet by including nutritious food, such as fruits, vegetables, nuts, whole grains, and lean meats.  If  you are taking a laxative, be sure to follow the directions on the label. SEEK MEDICAL CARE IF:  Your weakness gets worse.  You feel your heart pounding or racing.  You are vomiting or having diarrhea.  You are diabetic and having trouble keeping your blood glucose in the normal range. SEEK IMMEDIATE MEDICAL CARE IF:  You have chest pain, shortness of breath, or dizziness.  You are vomiting or having diarrhea for more than 2 days.  You faint. MAKE SURE YOU:   Understand these instructions.  Will watch your condition.  Will get help right away if you are not doing well or get worse.   This information is not intended to replace advice given to you by your health care provider. Make sure you discuss any questions you have with your health care provider.   Document Released: 06/02/2005 Document Revised: 06/23/2014 Document Reviewed: 12/03/2012 Elsevier Interactive Patient Education 2016 Elsevier Inc[employer] Sinusitis, Adult Sinusitis is redness, soreness, and inflammation of the paranasal sinuses. Paranasal sinuses are air pockets within the bones of your face. They are located beneath your eyes, in the middle of your forehead, and above your eyes. In healthy paranasal sinuses, mucus is able to drain out, and air is able to circulate through them by way of your nose. However, when your paranasal sinuses are inflamed, mucus and air can become trapped. This can allow bacteria and other germs to grow and cause infection. Sinusitis can develop quickly and last only a short time (acute) or continue over a long period (chronic). Sinusitis that lasts for more than 12 weeks is considered chronic. CAUSES Causes of sinusitis include:  Allergies.  Structural abnormalities, such as displacement  of the cartilage that separates your nostrils (deviated septum), which can decrease the air flow through your nose and sinuses and affect sinus drainage.  Functional abnormalities, such as when the small  hairs (cilia) that line your sinuses and help remove mucus do not work properly or are not present. SIGNS AND SYMPTOMS Symptoms of acute and chronic sinusitis are the same. The primary symptoms are pain and pressure around the affected sinuses. Other symptoms include:  Upper toothache.  Earache.  Headache.  Bad breath.  Decreased sense of smell and taste.  A cough, which worsens when you are lying flat.  Fatigue.  Fever.  Thick drainage from your nose, which often is green and may contain pus (purulent).  Swelling and warmth over the affected sinuses. DIAGNOSIS Your health care provider will perform a physical exam. During your exam, your health care provider may perform any of the following to help determine if you have acute sinusitis or chronic sinusitis:  Look in your nose for signs of abnormal growths in your nostrils (nasal polyps).  Tap over the affected sinus to check for signs of infection.  View the inside of your sinuses using an imaging device that has a light attached (endoscope). If your health care provider suspects that you have chronic sinusitis, one or more of the following tests may be recommended:  Allergy tests.  Nasal culture. A sample of mucus is taken from your nose, sent to a lab, and screened for bacteria.  Nasal cytology. A sample of mucus is taken from your nose and examined by your health care provider to determine if your sinusitis is related to an allergy. TREATMENT Most cases of acute sinusitis are related to a viral infection and will resolve on their own within 10 days. Sometimes, medicines are prescribed to help relieve symptoms of both acute and chronic sinusitis. These may include pain medicines, decongestants, nasal steroid sprays, or saline sprays. However, for sinusitis related to a bacterial infection, your health care provider will prescribe antibiotic medicines. These are medicines that will help kill the bacteria causing the  infection. Rarely, sinusitis is caused by a fungal infection. In these cases, your health care provider will prescribe antifungal medicine. For some cases of chronic sinusitis, surgery is needed. Generally, these are cases in which sinusitis recurs more than 3 times per year, despite other treatments. HOME CARE INSTRUCTIONS  Drink plenty of water. Water helps thin the mucus so your sinuses can drain more easily.  Use a humidifier.  Inhale steam 3-4 times a day (for example, sit in the bathroom with the shower running).  Apply a warm, moist washcloth to your face 3-4 times a day, or as directed by your health care provider.  Use saline nasal sprays to help moisten and clean your sinuses.  Take medicines only as directed by your health care provider.  If you were prescribed either an antibiotic or antifungal medicine, finish it all even if you start to feel better. SEEK IMMEDIATE MEDICAL CARE IF:  You have increasing pain or severe headaches.  You have nausea, vomiting, or drowsiness.  You have swelling around your face.  You have vision problems.  You have a stiff neck.  You have difficulty breathing.   This information is not intended to replace advice given to you by your health care provider. Make sure you discuss any questions you have with your health care provider.   Document Released: 06/02/2005 Document Revised: 06/23/2014 Document Reviewed: 06/17/2011 Elsevier Interactive Patient Education 2016 Elsevier  Inc. ° °

## 2015-08-02 NOTE — ED Notes (Signed)
Bed: WA24 Expected date:  Expected time:  Means of arrival:  Comments: [facility] 

## 2015-08-02 NOTE — Progress Notes (Signed)
Subjective:  By signing my name below, I, Rawaa Al Rifaie, attest that this documentation has been prepared under the direction and in the presence of Merri Ray, MD.  Leandra Kern, Medical Scribe. 08/02/2015.  2:36 PM.   Patient ID: Jaime Young, female    DOB: 11-Apr-1951, 65 y.o.   MRN: [id number]  Chief Complaint  Patient presents with  . Emesis    x 2 days  . Nasal Congestion    x 2 weeks  . Facial Swelling    x this morning    HPI HPI Comments: Jaime Young is a 65 y.o. female who presents to [facility] complaining of emesis, onset 2 days ago. She was seen 2 days ago by Dr. [name], with 9 days of facial pain, HA, cough, and malaise. Treated for sinusitis with doxycycline 100 mg BID, and hycodan cough syrup. Phone note to his office was noted today regarding a possible allergic reaction.  Pt reports that her symptoms first started when she took her first dosage of the hycodan cough syrup, taken at 9 o'clock 2 nights ago, and indicates that she started vomiting the next day, 5-6 times yesterday, and 3 episodes today. She had an episode of diarrhea the first time she had her emesis episode. Pt has not taken any more dosages of the medication other than that first dosage. Pt also reports that her fast presents with erythema and puffiness that started this morning, with associated soreness of the area. She indicates that she never experienced, Pt also states that she has symptoms of dry heaves, and abdominal pain secondary to the vomiting. She reports that she was able to urinate twice today, dark in color and with odor. She is not able to eat or drink. Pt did not take any medications for this. Pt does not have a history of diabetes or pre diabetes, or CKD. Pt is in remission for breast cancer, 5 years now. She denies blood in the stool, hematuria, rash or blisters in the mouth or genitals, SOB, trouble swallowing. Pt declined to go by ambulance, her daughter  offered that she will take her there.    Patient Active Problem List   Diagnosis Date Noted  . Acute sinus infection 07/31/2015  . Preventative health care 11/28/2014  . Breast cancer ([place]) 09/11/2014  . History of DVT (deep vein thrombosis) 08/29/2013  . Hypokalemia 11/04/2011  . Impaired glucose tolerance 11/17/2010  . Osteopenia 09/18/2010  . BREAST CANCER, HX OF 07/16/2010  . RADIAL NERVE INJURY 03/14/2008  . HYPERLIPIDEMIA 09/01/2007  . Anxiety state 09/01/2007  . RAYNAUD'S DISEASE 09/01/2007  . HEMORRHOIDS 09/01/2007  . ALLERGIC RHINITIS 09/01/2007  . DIVERTICULOSIS, COLON 09/01/2007  . DIVERTICULITIS, HX OF 09/01/2007  . MIGRAINES, HX OF 09/01/2007   Past Medical History  Diagnosis Date  . Osteopenia 09/18/2010  . Impaired glucose tolerance 11/17/2010  . Hyperlipidemia   . Anxiety   . Allergic rhinitis   . DJD (degenerative joint disease) of lumbar spine   . Diverticulitis   . Diverticulosis of colon   . Migraine   . Raynaud's disease   . DVT (deep venous thrombosis) ([place]) 1977  . RADIAL NERVE INJURY   . RAYNAUD'S DISEASE   . SUPERFICIAL PHLEBITIS 09/01/2007  . Cancer ([place])     stage I right breast cancer  . Post-operative nausea and vomiting   . 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 Total abdominal hysterectomy w/ bilateral salpingoophorectomy  1983    secondary to edometriosis  . Back surgery  2002  . Hemorrhoid surgery  1983  . Tonsillectomy    . Breast surgery  1989/2001    implants  . Breast lumpectomy Right 2011    axillary node dissection  . Pars plana vitrectomy Left 2013    w/Membranectomy   Allergies  Allergen Reactions  . Dextromethorphan Swelling  . Ciprofloxacin Other (See Comments)    Pt denies  . Codeine Nausea Only  . Penicillins Hives  . Sulfonamide Derivatives Nausea Only  . Aflibercept Rash   Prior to Admission medications   Medication Sig Start Date End Date Taking?  Authorizing Provider  brimonidine-timolol (COMBIGAN) 0.2-0.5 % ophthalmic solution Place 1 drop into both eyes at bedtime.    Yes Historical Provider, MD  Calcium-Magnesium-Vitamin D (CALCIUM 500) 500-250-200 MG-MG-UNIT TABS Take 3 tablets by mouth daily.   Yes Historical Provider, MD  doxycycline (VIBRA-TABS) 100 MG tablet Take 1 tablet (100 mg total) by mouth 2 (two) times daily. 07/31/15  Yes Corwin Levins, MD  HYDROcodone-homatropine [facility]) 5-1.5 MG/5ML syrup Take 5 mLs by mouth every 6 (six) hours as needed for cough. 07/31/15  Yes Corwin Levins, MD  Omega-3 Fatty Acids (FISH OIL) 300 MG CAPS Take 1 capsule by mouth daily.   Yes Historical Provider, MD  tamoxifen (NOLVADEX) 20 MG tablet Take 1 tablet (20 mg total) by mouth daily. 09/11/14  Yes Sheffield Slider, NP  vitamin C (ASCORBIC ACID) 500 MG tablet Take 1,000 mg by mouth daily.   Yes Historical Provider, MD  vitamin E (VITAMIN E) 400 UNIT capsule Take 400 Units by mouth daily.   Yes Historical Provider, MD  Multiple Vitamins tablet Take 1 tablet by mouth daily. Reported on 08/02/2015    Historical Provider, MD   Social History   Social History  . Marital Status: Married    Spouse Name: N/A  . Number of Children: 1  . Years of Education: N/A   Occupational History  . ACCOUNT EXECUTIVE   . Systems developer    Social History Main Topics  . Smoking status: Former Games developer  . Smokeless tobacco: Never Used  . Alcohol Use: No  . Drug Use: No  . Sexual Activity: Not on file   Other Topics Concern  . Not on file   Social History Narrative    Review of Systems  HENT: Negative for trouble swallowing.   Respiratory: Negative for shortness of breath.   Gastrointestinal: Positive for nausea, vomiting, abdominal pain and diarrhea. Negative for blood in stool.  Genitourinary: Positive for decreased urine volume. Negative for hematuria.  Musculoskeletal: Positive for myalgias.  Skin: Positive for color change and rash.        Objective:   Physical Exam  Constitutional: She is oriented to person, place, and time. She appears well-developed and well-nourished. No distress.  HENT:  Head: Normocephalic and atraumatic.  Moist oral mucosa.  Dry cracked lips, but no inter-molar lesion. Puffiness of the face BL.  Erythema and slight purplish appearence and swelling of the malar prominences     Eyes: EOM are normal. Pupils are equal, round, and reactive to light.  Neck: Neck supple.  Cardiovascular: Normal rate.   Pulmonary/Chest: Effort normal.  Neurological: She is alert and oriented to person, place, and time. No cranial nerve deficit.  Skin: Skin is warm and dry.  Flushing erythema of the upper chest,  dorsal shoulder, upper neck, and slightly to the back.   Psychiatric: She has a normal mood and affect. Her behavior is normal.  Nursing note and vitals reviewed.   Filed Vitals:   08/02/15 1241  BP: 104/67  Pulse: 92  Temp: 98.7 F (37.1 C)  TempSrc: Oral  Resp: 16  Height: 5\' 2"  (1.575 m)  Weight: 130 lb (58.968 kg)  SpO2: 98%      Assessment & Plan:   Jaime Young is a 65 y.o. female Non-intractable vomiting with nausea, vomiting of unspecified type - Plan: ondansetron (ZOFRAN-ODT) disintegrating tablet 4 mg  Allergic reaction, initial encounter  Facial swelling  Red face   Suspect allergic reaction to hydrocodone, dextromethorphan, or combo of these meds and doxycycline use for nausea and vomiting. Now possible early volume depletion with recurrent vomiting and dark urine, but now progressing to facial swelling, redness of face upper chest and back.  -  Recommended IV placement, transport to the ER. She declined IV  In office and transport, as wants to go by private vehicle.  -  Zofran ODT 4 mg was given to help with nausea, and charge nurse advised at [facility] ER. Advised patient and family member if her symptoms changed in route to call 911.   Meds ordered this encounter  Medications   . ondansetron (ZOFRAN-ODT) disintegrating tablet 4 mg    Sig:    Patient Instructions  After leaving our office, proceed directly to the emergency room.  If there are any worsening of symptoms on the way to the emergency room, call 911.  You were given Zofran 4 mg oral dissolvable tablet here to help start treatment for the nausea/vomiting, but will need further evaluation and treatment through the emergency room.   Drug Allergy Allergic reactions to medicines are common. Some allergic reactions are mild. A delayed type of drug allergy that occurs 1 week or more after exposure to a medicine or vaccine is called serum sickness. A life-threatening, sudden (acute) allergic reaction that involves the whole body is called anaphylaxis. CAUSES  "True" drug allergies occur when there is an allergic reaction to a medicine. This is caused by overactivity of the immune system. First, the body becomes sensitized. The immune system is triggered by your first exposure to the medicine. Following this first exposure, future exposure to the same medicine may be life-threatening. Almost any medicine can cause an allergic reaction. Common ones are:  Penicillin.  Sulfonamides (sulfa drugs).  Local anesthetics.  X-ray dyes that contain iodine. SYMPTOMS  Common symptoms of a minor allergic reaction are:  Swelling around the mouth.  An itchy red rash or hives.  Vomiting or diarrhea. Anaphylaxis can cause swelling of the mouth and throat. This makes it difficult to breathe and swallow. Severe reactions can be fatal within seconds, even after exposure to only a trace amount of the drug that causes the reaction. HOME CARE INSTRUCTIONS  If you are unsure of what caused your reaction, write down:  The names of the medicines you took.  How much medicine you took.  How you took the medicine, such as whether you took a pill, injected the medicine, or applied it to your skin.  All of the things you ate and  drank.  The date and time of your reaction.  The symptoms of the reaction.  You may want to follow up with an allergy specialist after the reaction has cleared in order to be tested to confirm the allergy. It  is important to confirm that your reaction is an allergy, not just a side effect to the medicine. If you have a true allergy to a medicine, this may prevent that medicine and related medicines from being given to you when you are very ill.  If you have hives or a rash:  Take medicines as directed by your caregiver.  You may use an over-the-counter antihistamine (diphenhydramine) as needed.  Apply cold compresses to the skin or take baths in cool water. Avoid hot baths or showers.  If you are severely allergic:  Continuous observation after a severe reaction may be needed. Hospitalization is often required.  Wear a medical alert bracelet or necklace stating your allergy.  You and your family must learn how to use an anaphylaxis kit or give an epinephrine injection to temporarily treat an emergency allergic reaction. If you have had a severe reaction, always carry your epinephrine injection or anaphylaxis kit with you. This can be lifesaving if you have a severe reaction.  Do not drive or perform tasks after treatment until the medicines used to treat your reaction have worn off, or until your caregiver says it is okay.  If you have a drug allergy that was confirmed by your health care provider:  Carry information about the drug allergy with you at all times.  Always check with a pharmacist before taking any over-the-counter medicine. SEEK MEDICAL CARE IF:   You think you had an allergic reaction. Symptoms usually start within 30 minutes after exposure.  Symptoms are getting worse rather than better.  You develop new symptoms.  The symptoms that brought you to your caregiver return. SEEK IMMEDIATE MEDICAL CARE IF:   You have swelling of the mouth, difficulty breathing, or  wheezing.  You have a tight feeling in your chest or throat.  You develop hives, swelling, or itching all over your body.  You develop severe vomiting or diarrhea.  You feel faint or pass out. This is an emergency. Use your epinephrine injection or anaphylaxis kit as you have been instructed. Call for emergency medical help. Even if you improve after the injection, you need to be examined at a hospital emergency department. MAKE SURE YOU:   Understand these instructions.  Will watch your condition.  Will get help right away if you are not doing well or get worse.   This information is not intended to replace advice given to you by your health care provider. Make sure you discuss any questions you have with your health care provider.   Document Released: 06/02/2005 Document Revised: 06/23/2014 Document Reviewed: 01/02/2015 Elsevier Interactive Patient Education [employer].      Return to the clinic or go to the nearest emergency room if any of your symptoms worsen or new symptoms occur.

## 2015-08-21 ENCOUNTER — Encounter: Payer: Self-pay | Admitting: Internal Medicine

## 2015-08-21 DIAGNOSIS — Z853 Personal history of malignant neoplasm of breast: Secondary | ICD-10-CM | POA: Diagnosis not present

## 2015-08-21 LAB — HM MAMMOGRAPHY

## 2015-09-04 ENCOUNTER — Telehealth: Payer: Self-pay | Admitting: Oncology

## 2015-09-04 DIAGNOSIS — H2511 Age-related nuclear cataract, right eye: Secondary | ICD-10-CM | POA: Insufficient documentation

## 2015-09-04 DIAGNOSIS — Z961 Presence of intraocular lens: Secondary | ICD-10-CM | POA: Insufficient documentation

## 2015-09-04 DIAGNOSIS — H35373 Puckering of macula, bilateral: Secondary | ICD-10-CM | POA: Diagnosis not present

## 2015-09-04 NOTE — Telephone Encounter (Signed)
Spoke with patient re new appointment for lab/GM 4/11 - GM 3/30 reschedule list. Added lab due to existing order - cxd 3/30 f/u.

## 2015-09-13 ENCOUNTER — Encounter: Payer: BLUE CROSS/BLUE SHIELD | Admitting: Oncology

## 2015-09-20 DIAGNOSIS — Z01419 Encounter for gynecological examination (general) (routine) without abnormal findings: Secondary | ICD-10-CM | POA: Diagnosis not present

## 2015-09-20 DIAGNOSIS — Z1389 Encounter for screening for other disorder: Secondary | ICD-10-CM | POA: Diagnosis not present

## 2015-09-20 DIAGNOSIS — R319 Hematuria, unspecified: Secondary | ICD-10-CM | POA: Diagnosis not present

## 2015-09-20 DIAGNOSIS — Z6824 Body mass index (BMI) 24.0-24.9, adult: Secondary | ICD-10-CM | POA: Diagnosis not present

## 2015-09-20 DIAGNOSIS — Z853 Personal history of malignant neoplasm of breast: Secondary | ICD-10-CM | POA: Diagnosis not present

## 2015-09-20 DIAGNOSIS — Z130 Encounter for screening for diseases of the blood and blood-forming organs and certain disorders involving the immune mechanism: Secondary | ICD-10-CM | POA: Diagnosis not present

## 2015-09-24 ENCOUNTER — Other Ambulatory Visit: Payer: Self-pay | Admitting: *Deleted

## 2015-09-24 DIAGNOSIS — C50919 Malignant neoplasm of unspecified site of unspecified female breast: Secondary | ICD-10-CM

## 2015-09-25 ENCOUNTER — Other Ambulatory Visit (HOSPITAL_BASED_OUTPATIENT_CLINIC_OR_DEPARTMENT_OTHER): Payer: Medicare Other

## 2015-09-25 ENCOUNTER — Ambulatory Visit (HOSPITAL_BASED_OUTPATIENT_CLINIC_OR_DEPARTMENT_OTHER): Payer: Medicare Other | Admitting: Oncology

## 2015-09-25 ENCOUNTER — Telehealth: Payer: Self-pay | Admitting: Oncology

## 2015-09-25 VITALS — BP 128/72 | HR 73 | Temp 97.6°F | Resp 18 | Ht 62.0 in | Wt 131.9 lb

## 2015-09-25 DIAGNOSIS — Z79810 Long term (current) use of selective estrogen receptor modulators (SERMs): Secondary | ICD-10-CM | POA: Diagnosis not present

## 2015-09-25 DIAGNOSIS — C50919 Malignant neoplasm of unspecified site of unspecified female breast: Secondary | ICD-10-CM | POA: Diagnosis not present

## 2015-09-25 DIAGNOSIS — C50411 Malignant neoplasm of upper-outer quadrant of right female breast: Secondary | ICD-10-CM | POA: Diagnosis not present

## 2015-09-25 LAB — CBC WITH DIFFERENTIAL/PLATELET
BASO%: 0.6 % (ref 0.0–2.0)
Basophils Absolute: 0 10*3/uL (ref 0.0–0.1)
EOS%: 1.6 % (ref 0.0–7.0)
Eosinophils Absolute: 0.1 10*3/uL (ref 0.0–0.5)
HEMATOCRIT: 40 % (ref 34.8–46.6)
HGB: 13.3 g/dL (ref 11.6–15.9)
LYMPH#: 1.4 10*3/uL (ref 0.9–3.3)
LYMPH%: 21.6 % (ref 14.0–49.7)
MCH: 30.4 pg (ref 25.1–34.0)
MCHC: 33.1 g/dL (ref 31.5–36.0)
MCV: 91.9 fL (ref 79.5–101.0)
MONO#: 0.4 10*3/uL (ref 0.1–0.9)
MONO%: 5.4 % (ref 0.0–14.0)
NEUT#: 4.7 10*3/uL (ref 1.5–6.5)
NEUT%: 70.8 % (ref 38.4–76.8)
Platelets: 181 10*3/uL (ref 145–400)
RBC: 4.36 10*6/uL (ref 3.70–5.45)
RDW: 12.8 % (ref 11.2–14.5)
WBC: 6.6 10*3/uL (ref 3.9–10.3)

## 2015-09-25 LAB — COMPREHENSIVE METABOLIC PANEL
ALT: 10 U/L (ref 0–55)
AST: 18 U/L (ref 5–34)
Albumin: 3.9 g/dL (ref 3.5–5.0)
Alkaline Phosphatase: 30 U/L — ABNORMAL LOW (ref 40–150)
Anion Gap: 9 mEq/L (ref 3–11)
BUN: 11.4 mg/dL (ref 7.0–26.0)
CHLORIDE: 107 meq/L (ref 98–109)
CO2: 28 meq/L (ref 22–29)
CREATININE: 0.9 mg/dL (ref 0.6–1.1)
Calcium: 9.3 mg/dL (ref 8.4–10.4)
EGFR: 71 mL/min/{1.73_m2} — ABNORMAL LOW (ref >90)
GLUCOSE: 90 mg/dL (ref 70–140)
Potassium: 3.8 mEq/L (ref 3.5–5.1)
SODIUM: 145 meq/L (ref 136–145)
Total Protein: 6.5 g/dL (ref 6.4–8.3)

## 2015-09-25 NOTE — Telephone Encounter (Signed)
appt made and avs printed °

## 2015-09-25 NOTE — Progress Notes (Signed)
ID: Jaime Young   DOB: 1951-05-24  MR#: [id number]  VCX#:[id number]   PCP:  Oliver Barre, MD GYN:  Jeani Sow, FNP SUR:  Doloris Hall, MD OTHER:    CHIEF COMPLAINT:  Hx of Right Breast Cancer  CURRENT TREATMENT: tamoxifen   BREAST CANCER HISTORY:  from the original intake note:   She herself palpated a mass in her right breast, and brought it to Dr. Johnn Hai attention.  He set her up for a screening diagnostic mammography on July 09, 2009.  Dr. Samuella Cota performed the procedure, found no definite mammographic abnormality corresponding to the palpable complaint.  The ultrasound of the right breast did show a mixed echogenic mass at approximately 12 o'clock, extending to the implant capsule, and measuring 2.5 cm.  There was at least one abnormal lymph node in the right axilla, measuring 2.2 cm.    With this information, the patient was recalled for biopsy performed January 31,2011, and this showed ([id number]) an invasive ductal carcinoma with micropapillary features with the lymph node biopsy positive for a very similar looking tumor.  Grade of this tumor is not estimated.  The prognostic panel showed it to be estrogen receptor positive at 56%, progesterone receptor positive at 25%, with a borderline MIB-1 at 19%.  There was no amplification of HER2 by CISH with a ratio of 1.50.    With this information, the patient met with Dr. Dwain Sarna, and bilateral breast MRIs were obtained July 18, 2009.  There was an enhancing 2.8 cm mass corresponding to the previously biopsied malignancy, and an enlarged right axillary lymph node measuring 2.1 cm.  The left breast was unremarkable.  Dr. Dwain Sarna discussed these findings with the patient, and suggested neoadjuvant treatment.  Her only option would be a modified radical mastectomy.  He felt it was most reasonable to start with chemotherapy with a view to possibly making lumpectomy and radiation therapy possible.    Her subsequent history is as  detailed below.  INTERVAL HISTORY: Jaime Young returns  today for follow-up of her estrogen receptor positive breast cancer. She continues on tamoxifen, which she tolerates well. She doesn't have hot flashes and she doesn't have a vaginal discharge. In fact she has significant vaginal dryness. She has been using cocoa oil with some success. She obtains a tamoxifen for less than $10 a month.  REVIEW OF SYSTEMS: Jaime Young  Lost some hearing apparently when she had the chemotherapy. She is using hearing aids now and that does help she has gained 6 pounds in 5 years and that concerns her although I think if that is much better than most of my patients. She exercises regularly, usually getting on the treadmill daily. Aside from these issues a detailed review of systems today was noncontributory  PAST MEDICAL HISTORY: Past Medical History  Diagnosis Date  . Osteopenia 09/18/2010  . Impaired glucose tolerance 11/17/2010  . Hyperlipidemia   . Anxiety   . Allergic rhinitis   . DJD (degenerative joint disease) of lumbar spine   . Diverticulitis   . Diverticulosis of colon   . Migraine   . Raynaud's disease   . DVT (deep venous thrombosis) ([facility]) 1977  . RADIAL NERVE INJURY   . RAYNAUD'S DISEASE   . SUPERFICIAL PHLEBITIS 09/01/2007  . Cancer ([facility]CC)     stage I right breast cancer  . Post-operative nausea and vomiting   . 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  The patient being status post TAH-BSO in 1983.  She underwent a hemorrhoidectomy remotely under [employer].  Of course she has breast implants in place.  These were removed and replaced in 2001 because of contractures.  She is status post tonsillectomy and adenoidectomy.    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 [name].  She has also had  left eye cataract surgery.  She has a history of migraines, a history of mild hypercholesterolemia, and a history of osteopenia.   PAST SURGICAL HISTORY: Past Surgical History  Procedure Laterality Date  . Total abdominal hysterectomy w/ bilateral salpingoophorectomy  1983    secondary to edometriosis  . Young surgery  2002  . Hemorrhoid surgery  1983  . Tonsillectomy    . Breast surgery  1989/2001    implants  . Breast lumpectomy Right 2011    axillary node dissection  . Pars plana vitrectomy Left 2013    w/Membranectomy    FAMILY HISTORY Family History  Problem Relation Age of Onset  . Stroke Mother   . Hypertension Mother   . Heart disease Father   . Cancer Father   . Esophageal cancer Father   . Cancer Sister   . Stomach cancer Sister   . Breast cancer Other   . Bone cancer Other   . Stomach cancer Other   . Colon cancer Neg Hx   . Rectal cancer Neg Hx   The patient's  father died at the age of 35 from what sounds like a gastroesophageal junction tumor.  The patient's  mother died at the age of 75 after multiple strokes.  The patient has five brothers and three sisters, all of her siblings are half-siblings.  The problems are all on her father's side.  The sister, Jaime Young, had breast and ovarian cancer.  She is deceased, and was never tested for BRCA1 and 2.  Second sister had throat cancer.  There is no other history of cancer in the immediate family to her knowledge.    GYNECOLOGIC HISTORY:  (Updated March 2015) She is GXP1.  First pregnancy to term age 12.  Status post TAH/BSO in 1983 secondary to endometriosis. She took estrogen replacement between 1983 and 2010.  Went off the Climara patch at the time of diagnosis in 2011.    SOCIAL HISTORY:  (updated March 2015)  She worked for the [employer] here in town but has retired.  Her husband, Jaime Young, works for [employer] in the Photographer.  Daughter, Jaime Young, is a stay-at-home mom. Jaime Young has 3 grandchildren.  The  patient attends a [employer].     ADVANCED DIRECTIVES: Not in place  HEALTH MAINTENANCE:   (updated 08/29/2013)  Social History  Substance Use Topics  . Smoking status: Former Research scientist (life sciences)  . Smokeless tobacco: Never Used  . Alcohol Use: No     Colonoscopy:  2004  PAP: Feb 2014/Dr. Lomax  Bone density:  09/11/2010 (Dr. Ubaldo Glassing), osteopenia  Lipid panel:  Jan 2014/Dr. John     Current Outpatient Prescriptions  Medication Sig Dispense Refill  . brimonidine-timolol (COMBIGAN) 0.2-0.5 % ophthalmic solution Place 1 drop into both eyes at bedtime.     . Calcium-Magnesium-Vitamin D (CALCIUM 500) 500-250-200 MG-MG-UNIT TABS Take 3 tablets by mouth daily.    . clarithromycin (BIAXIN) 500 MG tablet Take 1 tablet (500 mg total) by mouth 2 (two)  times daily. 20 tablet 0  . doxycycline (VIBRA-TABS) 100 MG tablet Take 1 tablet (100 mg total) by mouth 2 (two) times daily. 20 tablet 0  . HYDROcodone-homatropine (HYCODAN) 5-1.5 MG/5ML syrup Take 5 mLs by mouth every 6 (six) hours as needed for cough. (Patient not taking: Reported on 08/02/2015) 180 mL 0  . Multiple Vitamins tablet Take 1 tablet by mouth daily. Reported on 08/02/2015    . Omega-3 Fatty Acids (FISH OIL) 300 MG CAPS Take 1 capsule by mouth daily.    . ondansetron (ZOFRAN ODT) 8 MG disintegrating tablet Take 1 tablet (8 mg total) by mouth every 8 (eight) hours as needed for nausea or vomiting. 12 tablet 0  . potassium chloride (K-DUR) 10 MEQ tablet Take 2 tablets (20 mEq total) by mouth daily. 10 tablet 0  . prednisoLONE acetate (PRED FORTE) 1 % ophthalmic suspension Place 1 drop into the left eye every morning.    . tamoxifen (NOLVADEX) 20 MG tablet Take 1 tablet (20 mg total) by mouth daily. 90 tablet 3  . vitamin C (ASCORBIC ACID) 500 MG tablet Take 1,000 mg by mouth daily.    . vitamin E (VITAMIN E) 400 UNIT capsule Take 400 Units by mouth daily.     No current facility-administered medications for this visit.    OBJECTIVE:  middle-aged white woman who appears stated age 65 Vitals:   09/25/15 1429  BP: 128/72  Pulse: 73  Temp: 97.6 F (36.4 C)  Resp: 18  Body mass index is 24.12 kg/(m^2).    ECOG:  0 Filed Weights   09/25/15 1429  Weight: 131 lb 14.4 oz (59.829 kg)   Sclerae unicteric, pupils round and equal Oropharynx clear and moist-- no thrush or other lesions No cervical or supraclavicular adenopathy Lungs no rales or rhonchi Heart regular rate and rhythm Abd soft, nontender, positive bowel sounds MSK no focal spinal tenderness, no upper extremity lymphedema Neuro: nonfocal, well oriented, appropriate affect Breasts:  The right breast is status post lumpectomy and radiation. There is no evidence of local recurrence. The right axilla is benign. There are bilateral breast implants. The left breast is unremarkable.   LAB RESULTS: Lab Results  Component Value Date   WBC 6.6 09/25/2015   NEUTROABS 4.7 09/25/2015   HGB 13.3 09/25/2015   HCT 40.0 09/25/2015   MCV 91.9 09/25/2015   PLT 181 09/25/2015      Chemistry      Component Value Date/Time   NA 141 08/02/2015 1632   NA 147* 09/11/2014 0817   NA 142 01/27/2012 0942   K 2.9* 08/02/2015 1632   K 3.9 09/11/2014 0817   K 4.0 01/27/2012 0942   CL 102 08/02/2015 1632   CL 105 08/02/2012 1513   CL 102 01/27/2012 0942   CO2 25 08/02/2015 1632   CO2 29 09/11/2014 0817   CO2 30 01/27/2012 0942   BUN 14 08/02/2015 1632   BUN 9.5 09/11/2014 0817   BUN 12 01/27/2012 0942   CREATININE 0.68 08/02/2015 1632   CREATININE 0.8 09/11/2014 0817   CREATININE 0.80 10/29/2013 0839      Component Value Date/Time   CALCIUM 9.4 08/02/2015 1632   CALCIUM 9.1 09/11/2014 0817   CALCIUM 9.2 01/27/2012 0942   ALKPHOS 50 08/02/2015 1632   ALKPHOS 34* 09/11/2014 0817   ALKPHOS 41 01/27/2012 0942   AST 54* 08/02/2015 1632   AST 20 09/11/2014 0817   AST 27 01/27/2012 0942   ALT 46 08/02/2015 1632  ALT 17 09/11/2014 0817   ALT 22 01/27/2012 0942    BILITOT 1.0 08/02/2015 1632   BILITOT 0.23 09/11/2014 0817   BILITOT 0.50 01/27/2012 0942      STUDIES:  bilateral diagnostic mammography with tomography at [facility] 08/21/2015 found no evidence of malignancy. The breast composition was category B  ASSESSMENT: 65 y.o. [place] woman   (1)  status post right breast and axillary lymph node biopsy January 2011, both positive for a clinical T2 N1 invasive ductal carcinoma which was estrogen and progesterone receptor-positive, HER-2/neu-negative with an MIB-1 of 19%.  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  Margins were negative.   (3)  completed radiation therapy in November 2011, then started letrozole but this had to be discontinued April 2012 because of tolerance issues.   (4) on tamoxifen as of May 2012  PLAN:  Jaime Young is 5-FU is out from her definitive surgery with no evidence of the recurrence. This is very favorable  She has just about completed 5 years of adjuvant anti-estrogens with tamoxifen. We discussed 3 choices. She can simply stop area however we now have data that continuing tamoxifen for 10 years instead of 5 is helpful and further reduces the risk of recurrence. The  The other choice is to switch to anastrozole and continue that for 5 years.   we discussed the possible side effects, toxicities and complications of anastrozole and it can only worsen her vaginal dryness and also of course it can thin her bones. By contrast tamoxifen will strengthen her bones and if the vaginal dryness problems get significantly worse she  can always use vaginal estrogens, which is not a problem so long as she is on tamoxifen.   she is going to continue tamoxifen for 5 more years and I think is a very reasonable decision. She is afraid of using vaginal estrogens although I reassured her it is quite safe. In any case I am referring her to our [facility] pelvic health program for further information regarding how to manage that issue.   Jaime Young will return to see me in one year. She knows to call for any problems that may develop before that.  Chauncey Cruel, MD     09/25/2015

## 2015-10-02 DIAGNOSIS — R319 Hematuria, unspecified: Secondary | ICD-10-CM | POA: Diagnosis not present

## 2015-10-03 DIAGNOSIS — R319 Hematuria, unspecified: Secondary | ICD-10-CM | POA: Diagnosis not present

## 2015-11-15 ENCOUNTER — Other Ambulatory Visit: Payer: Self-pay | Admitting: Nurse Practitioner

## 2015-11-23 DIAGNOSIS — R3121 Asymptomatic microscopic hematuria: Secondary | ICD-10-CM | POA: Diagnosis not present

## 2015-11-23 DIAGNOSIS — N3941 Urge incontinence: Secondary | ICD-10-CM | POA: Diagnosis not present

## 2015-12-07 DIAGNOSIS — R3121 Asymptomatic microscopic hematuria: Secondary | ICD-10-CM | POA: Diagnosis not present

## 2015-12-07 DIAGNOSIS — R3129 Other microscopic hematuria: Secondary | ICD-10-CM | POA: Diagnosis not present

## 2015-12-13 DIAGNOSIS — R3121 Asymptomatic microscopic hematuria: Secondary | ICD-10-CM | POA: Diagnosis not present

## 2015-12-13 DIAGNOSIS — N3941 Urge incontinence: Secondary | ICD-10-CM | POA: Diagnosis not present

## 2016-01-08 DIAGNOSIS — H3581 Retinal edema: Secondary | ICD-10-CM | POA: Diagnosis not present

## 2016-01-08 DIAGNOSIS — Z961 Presence of intraocular lens: Secondary | ICD-10-CM | POA: Diagnosis not present

## 2016-01-08 DIAGNOSIS — H2511 Age-related nuclear cataract, right eye: Secondary | ICD-10-CM | POA: Diagnosis not present

## 2016-01-08 DIAGNOSIS — H35373 Puckering of macula, bilateral: Secondary | ICD-10-CM | POA: Diagnosis not present

## 2016-02-22 DIAGNOSIS — H2511 Age-related nuclear cataract, right eye: Secondary | ICD-10-CM | POA: Diagnosis not present

## 2016-02-22 DIAGNOSIS — H402222 Chronic angle-closure glaucoma, left eye, moderate stage: Secondary | ICD-10-CM | POA: Diagnosis not present

## 2016-04-18 ENCOUNTER — Ambulatory Visit (INDEPENDENT_AMBULATORY_CARE_PROVIDER_SITE_OTHER): Payer: Medicare Other

## 2016-04-18 DIAGNOSIS — Z23 Encounter for immunization: Secondary | ICD-10-CM | POA: Diagnosis not present

## 2016-06-25 DIAGNOSIS — D225 Melanocytic nevi of trunk: Secondary | ICD-10-CM | POA: Diagnosis not present

## 2016-06-25 DIAGNOSIS — L814 Other melanin hyperpigmentation: Secondary | ICD-10-CM | POA: Diagnosis not present

## 2016-06-25 DIAGNOSIS — L819 Disorder of pigmentation, unspecified: Secondary | ICD-10-CM | POA: Diagnosis not present

## 2016-06-25 DIAGNOSIS — D2262 Melanocytic nevi of left upper limb, including shoulder: Secondary | ICD-10-CM | POA: Diagnosis not present

## 2016-06-25 DIAGNOSIS — H0265 Xanthelasma of left lower eyelid: Secondary | ICD-10-CM | POA: Diagnosis not present

## 2016-06-25 DIAGNOSIS — D2271 Melanocytic nevi of right lower limb, including hip: Secondary | ICD-10-CM | POA: Diagnosis not present

## 2016-06-25 DIAGNOSIS — L821 Other seborrheic keratosis: Secondary | ICD-10-CM | POA: Diagnosis not present

## 2016-06-25 DIAGNOSIS — D2272 Melanocytic nevi of left lower limb, including hip: Secondary | ICD-10-CM | POA: Diagnosis not present

## 2016-07-15 DIAGNOSIS — H2511 Age-related nuclear cataract, right eye: Secondary | ICD-10-CM | POA: Diagnosis not present

## 2016-07-15 DIAGNOSIS — H35373 Puckering of macula, bilateral: Secondary | ICD-10-CM | POA: Diagnosis not present

## 2016-07-15 DIAGNOSIS — Z961 Presence of intraocular lens: Secondary | ICD-10-CM | POA: Diagnosis not present

## 2016-07-31 ENCOUNTER — Encounter: Payer: Medicare Other | Admitting: Internal Medicine

## 2016-08-15 ENCOUNTER — Other Ambulatory Visit: Payer: Self-pay | Admitting: Medical Oncology

## 2016-08-15 DIAGNOSIS — C50411 Malignant neoplasm of upper-outer quadrant of right female breast: Secondary | ICD-10-CM

## 2016-08-15 DIAGNOSIS — Z170 Estrogen receptor positive status [ER+]: Principal | ICD-10-CM

## 2016-08-15 MED ORDER — TAMOXIFEN CITRATE 20 MG PO TABS: 20.0000 mg | ORAL_TABLET | Freq: Every day | ORAL | 2 refills | Status: DC

## 2016-08-20 ENCOUNTER — Ambulatory Visit (INDEPENDENT_AMBULATORY_CARE_PROVIDER_SITE_OTHER): Payer: Medicare Other | Admitting: Internal Medicine

## 2016-08-20 ENCOUNTER — Other Ambulatory Visit (INDEPENDENT_AMBULATORY_CARE_PROVIDER_SITE_OTHER): Payer: Medicare Other

## 2016-08-20 ENCOUNTER — Encounter: Payer: Self-pay | Admitting: Internal Medicine

## 2016-08-20 VITALS — BP 108/76 | HR 72 | Temp 98.6°F | Ht 62.0 in | Wt 132.0 lb

## 2016-08-20 DIAGNOSIS — R3129 Other microscopic hematuria: Secondary | ICD-10-CM | POA: Insufficient documentation

## 2016-08-20 DIAGNOSIS — J309 Allergic rhinitis, unspecified: Secondary | ICD-10-CM | POA: Diagnosis not present

## 2016-08-20 DIAGNOSIS — E785 Hyperlipidemia, unspecified: Secondary | ICD-10-CM | POA: Diagnosis not present

## 2016-08-20 DIAGNOSIS — Z23 Encounter for immunization: Secondary | ICD-10-CM | POA: Diagnosis not present

## 2016-08-20 DIAGNOSIS — R7302 Impaired glucose tolerance (oral): Secondary | ICD-10-CM | POA: Diagnosis not present

## 2016-08-20 DIAGNOSIS — Z0000 Encounter for general adult medical examination without abnormal findings: Secondary | ICD-10-CM

## 2016-08-20 LAB — HEPATIC FUNCTION PANEL
ALK PHOS: 34 U/L — AB (ref 39–117)
ALT: 11 U/L (ref 0–35)
AST: 19 U/L (ref 0–37)
Albumin: 4.3 g/dL (ref 3.5–5.2)
BILIRUBIN DIRECT: 0.1 mg/dL (ref 0.0–0.3)
TOTAL PROTEIN: 6.8 g/dL (ref 6.0–8.3)
Total Bilirubin: 0.3 mg/dL (ref 0.2–1.2)

## 2016-08-20 LAB — LIPID PANEL
CHOLESTEROL: 211 mg/dL — AB (ref 0–200)
HDL: 53.8 mg/dL (ref >39.00)
LDL Cholesterol: 133 mg/dL — ABNORMAL HIGH (ref 0–99)
NONHDL: 157.6
Total CHOL/HDL Ratio: 4
Triglycerides: 121 mg/dL (ref 0.0–149.0)
VLDL: 24.2 mg/dL (ref 0.0–40.0)

## 2016-08-20 LAB — CBC WITH DIFFERENTIAL/PLATELET
BASOS ABS: 0 10*3/uL (ref 0.0–0.1)
Basophils Relative: 0.5 % (ref 0.0–3.0)
EOS ABS: 0.1 10*3/uL (ref 0.0–0.7)
Eosinophils Relative: 1.8 % (ref 0.0–5.0)
HCT: 41.3 % (ref 36.0–46.0)
Hemoglobin: 13.9 g/dL (ref 12.0–15.0)
LYMPHS ABS: 1.6 10*3/uL (ref 0.7–4.0)
Lymphocytes Relative: 26.1 % (ref 12.0–46.0)
MCHC: 33.6 g/dL (ref 30.0–36.0)
MCV: 92.5 fl (ref 78.0–100.0)
MONO ABS: 0.3 10*3/uL (ref 0.1–1.0)
Monocytes Relative: 5.3 % (ref 3.0–12.0)
NEUTROS ABS: 4.1 10*3/uL (ref 1.4–7.7)
NEUTROS PCT: 66.3 % (ref 43.0–77.0)
PLATELETS: 216 10*3/uL (ref 150.0–400.0)
RBC: 4.47 Mil/uL (ref 3.87–5.11)
RDW: 12.7 % (ref 11.5–15.5)
WBC: 6.2 10*3/uL (ref 4.0–10.5)

## 2016-08-20 LAB — URINALYSIS, ROUTINE W REFLEX MICROSCOPIC
Bilirubin Urine: NEGATIVE
KETONES UR: NEGATIVE
Leukocytes, UA: NEGATIVE
Nitrite: NEGATIVE
PH: 6 (ref 5.0–8.0)
SPECIFIC GRAVITY, URINE: 1.01 (ref 1.000–1.030)
Total Protein, Urine: NEGATIVE
UROBILINOGEN UA: 0.2 (ref 0.0–1.0)
Urine Glucose: NEGATIVE

## 2016-08-20 LAB — BASIC METABOLIC PANEL
BUN: 14 mg/dL (ref 6–23)
CHLORIDE: 104 meq/L (ref 96–112)
CO2: 30 meq/L (ref 19–32)
Calcium: 9.4 mg/dL (ref 8.4–10.5)
Creatinine, Ser: 0.75 mg/dL (ref 0.40–1.20)
GFR: 82 mL/min (ref >60.00)
Glucose, Bld: 89 mg/dL (ref 70–99)
Potassium: 3.6 mEq/L (ref 3.5–5.1)
SODIUM: 142 meq/L (ref 135–145)

## 2016-08-20 LAB — TSH: TSH: 2.4 u[IU]/mL (ref 0.35–4.50)

## 2016-08-20 MED ORDER — PNEUMOCOCCAL 13-VAL CONJ VACC IM SUSP
0.5000 mL | INTRAMUSCULAR | Status: AC
  Administered 2016-08-25: 0.5 mL via INTRAMUSCULAR

## 2016-08-20 NOTE — Progress Notes (Signed)
Subjective:    Patient ID: Jaime Young, female    DOB: 1950/11/19, 66 y.o.   MRN: [id number]  HPI  Here for yearly f/u;  Overall doing ok;  Pt denies Chest pain, worsening SOB, DOE, wheezing, orthopnea, PND, worsening LE edema, palpitations, dizziness or syncope.  Pt denies neurological change such as new headache, facial or extremity weakness.  Pt denies polydipsia, polyuria, or low sugar symptoms. Pt states overall good compliance with treatment and medications, good tolerability, and has been trying to follow appropriate diet.  Pt denies worsening depressive symptoms, suicidal ideation or panic. No fever, night sweats, wt loss, loss of appetite, or other constitutional symptoms.  Pt states good ability with ADL's, has low fall risk, home safety reviewed and adequate, no other significant changes in hearing or vision, and only occasionally active with exercise. No other hx Wt Readings from Last 3 Encounters:  08/20/16 132 lb (59.9 kg)  09/25/15 131 lb 14.4 oz (59.8 kg)  08/02/15 130 lb (59 kg)  Due for prevnar, and lab f/u Did have DXA per GYN last yr per pt with mild bone loss.  Also had blod in urine last yr per GYN, had full eval at urology without significant findings.  Still has chronic hearing loss, wears hearing aids.  Does have several wks ongoing nasal allergy symptoms with clearish congestion, itch and sneezing, without fever, pain, ST, cough, swelling or wheezing. Past Medical History:  Diagnosis Date  . Allergic rhinitis   . Anxiety   . Cancer ([place])    stage I right breast cancer  . Diverticulitis   . Diverticulosis of colon   . DJD (degenerative joint disease) of lumbar spine   . DVT (deep venous thrombosis) ([place]) 1977  . 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 Hyperlipidemia   . Impaired glucose tolerance 11/17/2010  . Migraine   . Osteopenia 09/18/2010  . Post-operative nausea and vomiting   . RADIAL NERVE INJURY   . Raynaud's disease   . RAYNAUD'S DISEASE   .  SUPERFICIAL PHLEBITIS 09/01/2007   Past Surgical History:  Procedure Laterality Date  . BACK SURGERY  2002  . BREAST LUMPECTOMY Right 2011   axillary node dissection  . BREAST SURGERY  1989/2001   implants  . [place]  . PARS PLANA VITRECTOMY Left 2013   w/Membranectomy  . TONSILLECTOMY    . TOTAL ABDOMINAL HYSTERECTOMY W/ BILATERAL SALPINGOOPHORECTOMY  1983   secondary to edometriosis    reports that she has quit smoking. She has never used smokeless tobacco. She reports that she does not drink alcohol or use drugs. family history includes Bone cancer in her other; Breast cancer in her other; Cancer in her father and sister; Esophageal cancer in her father; Heart disease in her father; Hypertension in her mother; Stomach cancer in her other and sister; Stroke in her mother. Allergies  Allergen Reactions  . Dextromethorphan Swelling  . Ciprofloxacin Other (See Comments)    Pt denies  . Codeine Nausea Only  . Hydrocodone Nausea And Vomiting    Pt indicates N/V occurs with all narcotics.   . Sulfonamide Derivatives Nausea Only  . Aflibercept Rash   Current Outpatient Prescriptions on File Prior to Visit  Medication Sig Dispense Refill  . brimonidine-timolol (COMBIGAN) 0.2-0.5 % ophthalmic solution Place 1 drop into both eyes at bedtime.     . Calcium-Magnesium-Vitamin D (CALCIUM 500) 500-250-200 MG-MG-UNIT TABS Take 3 tablets by mouth daily.    Marland Kitchen  Multiple Vitamins tablet Take 1 tablet by mouth daily. Reported on 08/02/2015    . Omega-3 Fatty Acids (FISH OIL) 300 MG CAPS Take 1 capsule by mouth daily.    . tamoxifen (NOLVADEX) 20 MG tablet Take 1 tablet (20 mg total) by mouth daily. 90 tablet 2  . vitamin C (ASCORBIC ACID) 500 MG tablet Take 1,000 mg by mouth daily.    . vitamin E (VITAMIN E) 400 UNIT capsule Take 400 Units by mouth daily.     No current facility-administered medications on file prior to visit.       Review of Systems Constitutional: Negative for  increased diaphoresis, or other activity, appetite or siginficant weight change other than noted HENT: Negative for worsening hearing loss, ear pain, facial swelling, mouth sores and neck stiffness.   Eyes: Negative for other worsening pain, redness or visual disturbance.  Respiratory: Negative for choking or stridor Cardiovascular: Negative for other chest pain and palpitations.  Gastrointestinal: Negative for worsening diarrhea, blood in stool, or abdominal distention Genitourinary: Negative for hematuria, flank pain or change in urine volume.  Musculoskeletal: Negative for myalgias or other joint complaints.  Skin: Negative for other color change and wound or drainage.  Neurological: Negative for syncope and numbness. other than noted Hematological: Negative for adenopathy. or other swelling Psychiatric/Behavioral: Negative for hallucinations, SI, self-injury, decreased concentration or other worsening agitation.  All other system neg per pt    Objective:   Physical Exam BP 108/76   Pulse 72   Temp 98.6 F (37 C)   Ht 5\' 2"  (1.575 m)   Wt 132 lb (59.9 kg)   SpO2 98%   BMI 24.14 kg/m  VS noted,  Constitutional: Pt is oriented to person, place, and time. Appears well-developed and well-nourished, in no significant distress Head: Normocephalic and atraumatic  Bilat tm's with mild erythema.  Max sinus areas tender.  Pharynx with mild erythema, no exudate Eyes: Conjunctivae and EOM are normal. Pupils are equal, round, and reactive to light Right Ear: External ear normal.  Left Ear: External ear normal Nose: Nose normal.  Mouth/Throat: Oropharynx is clear and moist  Neck: Normal range of motion. Neck supple. No JVD present. No tracheal deviation present or significant neck LA or mass Cardiovascular: Normal rate, regular rhythm, normal heart sounds and intact distal pulses.   Pulmonary/Chest: Effort normal and breath sounds without rales or wheezing  Abdominal: Soft. Bowel sounds are  normal. NT. No HSM  Musculoskeletal: Normal range of motion. Exhibits no edema Lymphadenopathy: Has no cervical adenopathy.  Neurological: Pt is alert and oriented to person, place, and time. Pt has normal reflexes. No cranial nerve deficit. Motor grossly intact Skin: Skin is warm and dry. No rash noted or new ulcers Psychiatric:  Has normal mood and affect. Behavior is normal.  No other exam findings  ECG today I have personally interpreted Sinus  Rhythm  WITHIN NORMAL LIMITS     Assessment & Plan:

## 2016-08-20 NOTE — Assessment & Plan Note (Signed)
With neg evaluation per urology recent, for UA f/u today

## 2016-08-20 NOTE — Patient Instructions (Addendum)
You had the Prevnar pneumonia shot today  Please continue all other medications as before, and refills have been done if requested.  Please have the pharmacy call with any other refills you may need.  Please continue your efforts at being more active, low cholesterol diet, and weight control.  You are otherwise up to date with prevention measures today.  Please keep your appointments with your specialists as you may have planned  If you have Medicare related insurance (such as traditoinal Medicare, [employer] or [employer], or similar), Please make an appointment at the Scheduling desk with Sharee Pimple, the [employer], for your Wellness Visit in this office, which is a benefit with your insurance.  Please go to the LAB in the Basement (turn left off the elevator) for the tests to be done today  You will be contacted by phone if any changes need to be made immediately.  Otherwise, you will receive a letter about your results with an explanation, but please check with MyChart first.  Please remember to sign up for MyChart if you have not done so, as this will be important to you in the future with finding out test results, communicating by private email, and scheduling acute appointments online when needed.  Please return in 1 year for your yearly visit, or sooner if needed

## 2016-08-23 NOTE — Assessment & Plan Note (Signed)
stable overall by history and exam, and pt to continue medical treatment as before,  to f/u any worsening symptoms or concerns, for glc today, cont diet and wt control efforts

## 2016-08-23 NOTE — Assessment & Plan Note (Signed)
Mild, for otc zyrtec/nasacort prn,  to f/u any worsening symptoms or concerns

## 2016-08-23 NOTE — Assessment & Plan Note (Signed)
stable overall by history and exam, and pt to continue medical treatment as before,  to f/u any worsening symptoms or concerns, consider statin for ldl > 100

## 2016-09-02 DIAGNOSIS — Z853 Personal history of malignant neoplasm of breast: Secondary | ICD-10-CM | POA: Diagnosis not present

## 2016-09-15 ENCOUNTER — Other Ambulatory Visit: Payer: Self-pay | Admitting: Adult Health

## 2016-09-15 DIAGNOSIS — Z170 Estrogen receptor positive status [ER+]: Principal | ICD-10-CM

## 2016-09-15 DIAGNOSIS — C50411 Malignant neoplasm of upper-outer quadrant of right female breast: Secondary | ICD-10-CM

## 2016-09-16 ENCOUNTER — Other Ambulatory Visit (HOSPITAL_BASED_OUTPATIENT_CLINIC_OR_DEPARTMENT_OTHER): Payer: Medicare Other

## 2016-09-16 DIAGNOSIS — Z170 Estrogen receptor positive status [ER+]: Principal | ICD-10-CM

## 2016-09-16 DIAGNOSIS — C50411 Malignant neoplasm of upper-outer quadrant of right female breast: Secondary | ICD-10-CM | POA: Diagnosis present

## 2016-09-16 LAB — COMPREHENSIVE METABOLIC PANEL
ALBUMIN: 3.9 g/dL (ref 3.5–5.0)
ALT: 17 U/L (ref 0–55)
AST: 21 U/L (ref 5–34)
Alkaline Phosphatase: 38 U/L — ABNORMAL LOW (ref 40–150)
Anion Gap: 11 mEq/L (ref 3–11)
BUN: 10.6 mg/dL (ref 7.0–26.0)
CO2: 26 meq/L (ref 22–29)
Calcium: 9.2 mg/dL (ref 8.4–10.4)
Chloride: 105 mEq/L (ref 98–109)
Creatinine: 0.8 mg/dL (ref 0.6–1.1)
EGFR: 72 mL/min/{1.73_m2} — AB (ref >90)
GLUCOSE: 84 mg/dL (ref 70–140)
POTASSIUM: 3.9 meq/L (ref 3.5–5.1)
Sodium: 142 mEq/L (ref 136–145)
Total Bilirubin: 0.22 mg/dL (ref 0.20–1.20)
Total Protein: 6.8 g/dL (ref 6.4–8.3)

## 2016-09-16 LAB — CBC WITH DIFFERENTIAL/PLATELET
BASO%: 0.7 % (ref 0.0–2.0)
BASOS ABS: 0 10*3/uL (ref 0.0–0.1)
EOS ABS: 0.1 10*3/uL (ref 0.0–0.5)
EOS%: 2.8 % (ref 0.0–7.0)
HCT: 40.4 % (ref 34.8–46.6)
HEMOGLOBIN: 13.6 g/dL (ref 11.6–15.9)
LYMPH%: 31.7 % (ref 14.0–49.7)
MCH: 31.2 pg (ref 25.1–34.0)
MCHC: 33.6 g/dL (ref 31.5–36.0)
MCV: 92.8 fL (ref 79.5–101.0)
MONO#: 0.4 10*3/uL (ref 0.1–0.9)
MONO%: 8.3 % (ref 0.0–14.0)
NEUT%: 56.5 % (ref 38.4–76.8)
NEUTROS ABS: 2.5 10*3/uL (ref 1.5–6.5)
Platelets: 191 10*3/uL (ref 145–400)
RBC: 4.35 10*6/uL (ref 3.70–5.45)
RDW: 12.8 % (ref 11.2–14.5)
WBC: 4.4 10*3/uL (ref 3.9–10.3)
lymph#: 1.4 10*3/uL (ref 0.9–3.3)

## 2016-09-23 ENCOUNTER — Ambulatory Visit (HOSPITAL_BASED_OUTPATIENT_CLINIC_OR_DEPARTMENT_OTHER): Payer: Medicare Other | Admitting: Oncology

## 2016-09-23 VITALS — BP 109/64 | HR 65 | Temp 98.6°F | Resp 18 | Ht 62.0 in | Wt 137.7 lb

## 2016-09-23 DIAGNOSIS — Z79811 Long term (current) use of aromatase inhibitors: Secondary | ICD-10-CM

## 2016-09-23 DIAGNOSIS — Z170 Estrogen receptor positive status [ER+]: Secondary | ICD-10-CM

## 2016-09-23 DIAGNOSIS — C50411 Malignant neoplasm of upper-outer quadrant of right female breast: Secondary | ICD-10-CM | POA: Diagnosis not present

## 2016-09-23 DIAGNOSIS — C773 Secondary and unspecified malignant neoplasm of axilla and upper limb lymph nodes: Secondary | ICD-10-CM | POA: Diagnosis not present

## 2016-09-23 MED ORDER — TAMOXIFEN CITRATE 20 MG PO TABS: 20.0000 mg | ORAL_TABLET | Freq: Every day | ORAL | 2 refills | Status: DC

## 2016-09-23 NOTE — Progress Notes (Signed)
ID: Jaime Young   DOB: 1951/02/27  MR#: [id number]  OIZ#:[id number]   PCP:  Cathlean Cower, MD GYN:  Lesia Hausen, FNP SUR:  Lucretia Field, MD OTHER:    CHIEF COMPLAINT:  Hx of Right Breast Cancer  CURRENT TREATMENT: tamoxifen   BREAST CANCER HISTORY:  from the original intake note:   She herself palpated a mass in her right breast, and brought it to Dr. [name] attention.  He set her up for a screening diagnostic mammography on July 09, 2009.  Dr. [name] performed the procedure, found no definite mammographic abnormality corresponding to the palpable complaint.  The ultrasound of the right breast did show a mixed echogenic mass at approximately 12 o'clock, extending to the implant capsule, and measuring 2.5 cm.  There was at least one abnormal lymph node in the right axilla, measuring 2.2 cm.    With this information, the patient was recalled for biopsy performed January 31,2011, and this showed ([id number]) an invasive ductal carcinoma with micropapillary features with the lymph node biopsy positive for a very similar looking tumor.  Grade of this tumor is not estimated.  The prognostic panel showed it to be estrogen receptor positive at 56%, progesterone receptor positive at 25%, with a borderline MIB-1 at 19%.  There was no amplification of HER2 by CISH with a ratio of 1.50.    With this information, the patient met with Dr. [name], and bilateral breast MRIs were obtained July 18, 2009.  There was an enhancing 2.8 cm mass corresponding to the previously biopsied malignancy, and an enlarged right axillary lymph node measuring 2.1 cm.  The left breast was unremarkable.  Dr. [name] discussed these findings with the patient, and suggested neoadjuvant treatment.  Her only option would be a modified radical mastectomy.  He felt it was most reasonable to start with chemotherapy with a view to possibly making lumpectomy and radiation therapy possible.    Her subsequent history is as  detailed below.  INTERVAL HISTORY: [name] returns today for her estrogen receptor positive breast cancer. She continues on tamoxifen with excellent tolerance. She denies problems with hot flashes, she has no vaginal wetness, and she obtains the drug at approximately $3 per month.  REVIEW OF SYSTEMS: [name] is doing "pretty good" but she is having some discomfort in the medial aspect of the right upper arm, right axilla, sometimes stretching across the right breast, and sometimes involving her upper back. This is not like a cramp, but more like a pole. She says this has been present for 6 years but has been a little bit worse for the last few months. It is not there all the time. It is not caused by any particular movement or position. It is relieved by stretching. Aside from that she wears that her implants have been in place so long and she wonders if they need to be replaced. Finally she tells me she goes to bed about 8:30 at night, sleeps well, gets up about 5:30 in the morning, but wakes up tired. She is not exercising regularly. A detailed review of systems was otherwise stable  PAST MEDICAL HISTORY: Past Medical History:  Diagnosis Date  . Allergic rhinitis   . Anxiety   . Cancer ([place])    stage I right breast cancer  . Diverticulitis   . Diverticulosis of colon   . DJD (degenerative joint disease) of lumbar spine   . DVT (deep venous thrombosis) ([place]) 1977  . Hearing loss of both ears  from [place]  . Hyperlipidemia   . Impaired glucose tolerance 11/17/2010  . Migraine   . Osteopenia 09/18/2010  . Post-operative nausea and vomiting   . RADIAL NERVE INJURY   . Raynaud's disease   . RAYNAUD'S DISEASE   . SUPERFICIAL PHLEBITIS 09/01/2007  The past medical history is significant for endometriosis.  The patient being status post TAH-BSO in 1983.  She underwent a hemorrhoidectomy remotely under [employer].  Of course she has breast implants in place.  These were removed and replaced in  2001 because of contractures.  She is status post tonsillectomy and adenoidectomy.    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 [name].  She has also had left eye cataract surgery.  She has a history of migraines, a history of mild hypercholesterolemia, and a history of osteopenia.   PAST SURGICAL HISTORY: Past Surgical History:  Procedure Laterality Date  . BACK SURGERY  2002  . BREAST LUMPECTOMY Right 2011   axillary node dissection  . BREAST SURGERY  1989/2001   implants  . [place]  . PARS PLANA VITRECTOMY Left 2013   w/Membranectomy  . TONSILLECTOMY    . TOTAL ABDOMINAL HYSTERECTOMY W/ BILATERAL SALPINGOOPHORECTOMY  1983   secondary to edometriosis    FAMILY HISTORY Family History  Problem Relation Age of Onset  . Stroke Mother   . Hypertension Mother   . Heart disease Father   . Cancer Father   . Esophageal cancer Father   . Cancer Sister   . Stomach cancer Sister   . Breast cancer Other   . Bone cancer Other   . Stomach cancer Other   . Colon cancer Neg Hx   . Rectal cancer Neg Hx   The patient's  father died at the age of 49 from what sounds like a gastroesophageal junction tumor.  The patient's  mother died at the age of 34 after multiple strokes.  The patient has five brothers and three sisters, all of her siblings are half-siblings.  The problems are all on her father's side.  The sister, [name], had breast and ovarian cancer.  She is deceased, and was never tested for BRCA1 and 2.  Second sister had throat cancer.  There is no other history of cancer in the immediate family to her knowledge.    GYNECOLOGIC HISTORY:  (Updated March 2015) She is GXP1.  First pregnancy to term age 76.  Status post TAH/BSO in 1983 secondary to endometriosis. She took estrogen replacement between 1983 and 2010.  Went off the  [place] patch at the time of diagnosis in 2011.    SOCIAL HISTORY:  (updated March 2015)  She worked for the [employer] here in town but has retired.  Her husband, [name], works for [employer] in the Photographer.  Daughter, [name], is a stay-at-home mom. Marquis has 3 grandchildren.  The patient attends a [employer].     ADVANCED DIRECTIVES: Not in place  HEALTH MAINTENANCE:   (updated 08/29/2013)  Social History  Substance Use Topics  . Smoking status: Former Research scientist (life sciences)  . Smokeless tobacco: Never Used  . Alcohol use No     Colonoscopy:  2004  PAP: Feb 2014/Dr. Lomax  Bone density:  09/11/2010 (Dr. Ubaldo Glassing), osteopenia  Lipid panel:  Jan 2014/Dr. John     Current Outpatient Prescriptions  Medication Sig Dispense Refill  .  cyanocobalamin 500 MCG tablet Take 500 mcg by mouth daily.    . brimonidine-timolol (COMBIGAN) 0.2-0.5 % ophthalmic solution Place 1 drop into both eyes at bedtime.     . Calcium-Magnesium-Vitamin D (CALCIUM 500) 500-250-200 MG-MG-UNIT TABS Take 3 tablets by mouth daily.    . Multiple Vitamins tablet Take 1 tablet by mouth daily. Reported on 08/02/2015    . Omega-3 Fatty Acids (FISH OIL) 300 MG CAPS Take 1 capsule by mouth daily.    . tamoxifen (NOLVADEX) 20 MG tablet Take 1 tablet (20 mg total) by mouth daily. 90 tablet 2  . vitamin C (ASCORBIC ACID) 500 MG tablet Take 1,000 mg by mouth daily.    . vitamin E (VITAMIN E) 400 UNIT capsule Take 400 Units by mouth daily.     No current facility-administered medications for this visit.     OBJECTIVE: middle-aged white woman In no acute distress  Vitals:   09/23/16 1317  BP: 109/64  Pulse: 65  Resp: 18  Temp: 98.6 F (37 C)  Body mass index is 25.19 kg/m.    ECOG:  0 Filed Weights   09/23/16 1317  Weight: 137 lb 11.2 oz (62.5 kg)   Sclerae unicteric, EOMs intact Oropharynx clear and moist No cervical or supraclavicular adenopathy Lungs no rales or rhonchi Heart regular rate and  rhythm Abd soft, nontender, positive bowel sounds MSK no focal spinal tenderness, no upper extremity lymphedema Neuro: nonfocal, well oriented, appropriate affect Breasts: The right breast is status post lumpectomy and radiation with no evidence of local recurrence. Left breast is unremarkable. The patient does have bilateral breast implants in place but by exam they appear quite viable. The axillae are benign.  LAB RESULTS: Lab Results  Component Value Date   WBC 4.4 09/16/2016   NEUTROABS 2.5 09/16/2016   HGB 13.6 09/16/2016   HCT 40.4 09/16/2016   MCV 92.8 09/16/2016   PLT 191 09/16/2016      Chemistry      Component Value Date/Time   NA 142 09/16/2016 0746   K 3.9 09/16/2016 0746   CL 104 08/20/2016 1147   CL 105 08/02/2012 1513   CO2 26 09/16/2016 0746   BUN 10.6 09/16/2016 0746   CREATININE 0.8 09/16/2016 0746      Component Value Date/Time   CALCIUM 9.2 09/16/2016 0746   ALKPHOS 38 (L) 09/16/2016 0746   AST 21 09/16/2016 0746   ALT 17 09/16/2016 0746   BILITOT 0.22 09/16/2016 0746      STUDIES: Mammography at [facility] 09/02/2016 found a breast density to be category B. There was no evidence of malignancy. The saline implants, subpectoral, are stable with no evidence of rupture or distortion  ASSESSMENT: 66 y.o. [place] woman   (1)  status post right breast and axillary lymph node biopsy January 2011, both positive for a clinical T2 N1 invasive ductal carcinoma which was estrogen and progesterone receptor-positive, HER-2/neu-negative with an MIB-1 of 19%.  (2)  Neoadjuvantly she received 4 cycles of dose-dense doxorubicin and cyclophosphamide followed by 3 doses of weekly paclitaxel discontinued secondary to neuropathy (which has resolved), followed by carboplatin and gemcitabine for 2 cycles with very poor tolerance,completed July 2011  (2) underwent right lumpectomy and axillary lymph node dissection August 2011 for a residual 1.5 cm invasive ductal carcinoma,  grade 2, involving 2/11 lymph nodes.  Margins were negative.   (3)  completed radiation therapy in November 2011, then started letrozole but this had to be discontinued April 2012 because of  tolerance issues.   (4) on tamoxifen as of May 2012, Plan is to continue for a total of 10 years  PLAN:  Faithlyn is now 6-1/2 years out from definitive surgery for her breast cancer with no evidence of disease recurrence. His is very favorable.  She is tolerating tamoxifen with essentially no side effects. The plan is to continue that for a total of 10 years.  I reassured her that the symptoms she is having in her right upper arm shoulder upper back and breast are all secondary to scar tissue from her earlier surgeries. I offered her a rehabilitation visit but she thinks she knows to do this stretching exercises.  As for her sense of feeling tired she needs to exercise more. I gave her information on the [employer] program and encouraged her to participate.  Her implants are fine and there is no need to replace and let if she wants a revision I gave her the name of a local plastic surgeon that she can consider for a second opinion  Otherwise she will see me again in one year. She knows to call for any problems that may develop before that visit.  Chauncey Cruel, MD     09/23/2016

## 2017-01-09 ENCOUNTER — Ambulatory Visit (INDEPENDENT_AMBULATORY_CARE_PROVIDER_SITE_OTHER): Payer: Medicare Other | Admitting: Family Medicine

## 2017-01-09 ENCOUNTER — Encounter: Payer: Self-pay | Admitting: Family Medicine

## 2017-01-09 VITALS — BP 110/80 | HR 65 | Temp 97.7°F | Ht 62.0 in | Wt 133.0 lb

## 2017-01-09 DIAGNOSIS — M79674 Pain in right toe(s): Secondary | ICD-10-CM | POA: Diagnosis not present

## 2017-01-09 NOTE — Assessment & Plan Note (Signed)
It appears that she has some arthritic change of the interphalangeal joint based on ultrasound. Does not appear to be nerve related or associated with the first MTP joint. - She will follow-up next week to receive an injection of this joint before she goes on vacation and while significant walking - Advise a stiffer's old shoe can try an over-the-counter orthotic. - Can consider sending pennsaid in

## 2017-01-09 NOTE — Patient Instructions (Signed)
Thank you for coming in,   Please follow-up with me next week to get an injection of the toe. He can try other things such as Aspercreme or other topicals to rub on this area. He can continue to use naproxen as needed for pain. Wearing a stiffer soled shoe can help with this as well. He can try going to [employer] or  [employer] to try an over-the-counter orthotic.   Please feel free to call with any questions or concerns at any time, at [phone]. --Dr. Raeford Razor

## 2017-01-09 NOTE — Progress Notes (Signed)
Jaime Young - 66 y.o. female MRN [id number]  Date of birth: 1950/10/14  SUBJECTIVE:  Including CC & ROS.  Chief Complaint  Patient presents with  . Toe Pain    X3 months right big toe pain, top of toe under nailbed, hurts worse when wearing tennis shoe for extended period of time and then pain radiates down toe. patient tried soaking and ibuprofen helps a little. patient works out three times a week, states after working out it 9/10 pain   Ms. VernonIs a 66 year old female that is presenting with right great toe pain. This is been occurring for about 3 months now. The pain is occurring at the interphalangeal joint of her great toe. She denies any redness or erythema. The pain is worse with working out and walking. She feels like a swelling and throbbing sensation under her great toenail. She takes ibuprofen and that helps relieve her symptoms. She has no pain when she is completely arrest. She has not had any injury or trauma to that toe. The pain seems to be doing progressively worse.     Review of Systems  Constitutional: Negative for chills and fever.  Cardiovascular: Negative for leg swelling.  Gastrointestinal: Negative for nausea and vomiting.  Skin: Negative for color change and rash.  Neurological: Negative for weakness and numbness.  Hematological: Negative for adenopathy.   otherwise negative  HISTORY: Past Medical, Surgical, Social, and Family History Reviewed & Updated per EMR.   Pertinent Historical Findings include:  Past Medical History:  Diagnosis Date  . Allergic rhinitis   . Anxiety   . Cancer ([place])    stage I right breast cancer  . Diverticulitis   . Diverticulosis of colon   . DJD (degenerative joint disease) of lumbar spine   . DVT (deep venous thrombosis) ([place]) 1977  . 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 Hyperlipidemia   . Impaired glucose tolerance 11/17/2010  . Migraine   . Osteopenia 09/18/2010  . Post-operative nausea and vomiting   .  RADIAL NERVE INJURY   . Raynaud's disease   . RAYNAUD'S DISEASE   . SUPERFICIAL PHLEBITIS 09/01/2007    Past Surgical History:  Procedure Laterality Date  . BACK SURGERY  2002  . BREAST LUMPECTOMY Right 2011   axillary node dissection  . BREAST SURGERY  1989/2001   implants  . [place]  . PARS PLANA VITRECTOMY Left 2013   w/Membranectomy  . TONSILLECTOMY    . TOTAL ABDOMINAL HYSTERECTOMY W/ BILATERAL SALPINGOOPHORECTOMY  1983   secondary to edometriosis    Allergies  Allergen Reactions  . Dextromethorphan Swelling  . Ciprofloxacin Other (See Comments)    Pt denies  . Codeine Nausea Only  . Hydrocodone Nausea And Vomiting    Pt indicates N/V occurs with all narcotics.   . Sulfonamide Derivatives Nausea Only  . Aflibercept Rash    Family History  Problem Relation Age of Onset  . Stroke Mother   . Hypertension Mother   . Heart disease Father   . Cancer Father   . Esophageal cancer Father   . Cancer Sister   . Stomach cancer Sister   . Breast cancer Other   . Bone cancer Other   . Stomach cancer Other   . Colon cancer Neg Hx   . Rectal cancer Neg Hx      Social History   Social History  . Marital status: Married    Spouse name: N/A  .  Number of children: 1  . Years of education: N/A   Occupational History  . 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 Smoking status: Former Research scientist (life sciences)  . Smokeless tobacco: Never Used  . Alcohol use No  . Drug use: No  . Sexual activity: Not on file   Other Topics Concern  . Not on file   Social History Narrative  . No narrative on file     PHYSICAL EXAM:  VS: BP 110/80 (BP Location: Left Arm, Patient Position: Sitting, Cuff Size: Normal)   Pulse 65   Temp 97.7 F (36.5 C) (Oral)   Ht 5\' 2"  (1.575 m)   Wt 133 lb (60.3 kg)   SpO2 98%   BMI 24.33 kg/m  Physical Exam Gen: NAD, alert, cooperative with exam, well-appearing ENT: normal lips, normal nasal mucosa,    Eye: PERRL, normal conjunctiva and lids CV:  no edema, +2 pedal pulses   Resp: no accessory muscle use, non-labored,  GI: no masses or tenderness, no hernia  Skin: no rashes, no areas of induration  Neuro: +2 patellar DTR's, normal sensation to touch Psych:  normal insight, alert and oriented MSK: Right foot: No great toe erythema or swelling. Some tenderness to palpation over the interphalangeal joint of the great toe. Normal great toe dorsiflexion and plantarflexion. No abnormal calluses on the plantar aspect of the foot. No hallux valgus. No altered sensation. Normal gait. Normal rise on tiptoes Neurovascularly intact  Limited ultrasound: Right great toe:  No swelling observed in the first MTP joint Normal metatarsal and proximal phalanx. Interphalangeal joint has some swelling and spurring  Summary: Findings are suggestive of an arthritic interphalangeal joint of the great toe   Ultrasound and interpretation by Clearance Coots, MD       ASSESSMENT & PLAN:   Great toe pain, right It appears that she has some arthritic change of the interphalangeal joint based on ultrasound. Does not appear to be nerve related or associated with the first MTP joint. - She will follow-up next week to receive an injection of this joint before she goes on vacation and while significant walking - Advise a stiffer's old shoe can try an over-the-counter orthotic. - Can consider sending pennsaid in

## 2017-01-15 ENCOUNTER — Ambulatory Visit (INDEPENDENT_AMBULATORY_CARE_PROVIDER_SITE_OTHER): Payer: Medicare Other | Admitting: Family Medicine

## 2017-01-15 ENCOUNTER — Encounter: Payer: Self-pay | Admitting: Family Medicine

## 2017-01-15 DIAGNOSIS — M79674 Pain in right toe(s): Secondary | ICD-10-CM

## 2017-01-15 MED ORDER — METHYLPREDNISOLONE ACETATE 40 MG/ML IJ SUSP
20.0000 mg | Freq: Once | INTRAMUSCULAR | Status: AC
  Administered 2017-01-15: 20 mg via INTRA_ARTICULAR

## 2017-01-15 NOTE — Assessment & Plan Note (Signed)
Injection of the interphalangeal joint today. She will follow-up as needed. - Could consider custom orthotic going forward.

## 2017-01-15 NOTE — Patient Instructions (Signed)
Thank you for coming in,      Please feel free to call with any questions or concerns at any time, at [phone]. --Dr. Haeleigh Streiff  

## 2017-01-15 NOTE — Progress Notes (Signed)
Jaime Young - 66 y.o. female MRN [id number]  Date of birth: 17-Dec-1950  SUBJECTIVE:  Including CC & ROS.  No chief complaint on file.  [name] is a 66 year old female that is presenting today for an injection of her right IP joint on her great toe. She will be leaving for a vacation that will include significant walking.     Review of Systems  Musculoskeletal: Positive for arthralgias.    HISTORY: Past Medical, Surgical, Social, and Family History Reviewed & Updated per EMR.   Pertinent Historical Findings include:  Past Medical History:  Diagnosis Date  . Allergic rhinitis   . Anxiety   . Cancer ([place])    stage I right breast cancer  . Diverticulitis   . Diverticulosis of colon   . DJD (degenerative joint disease) of lumbar spine   . DVT (deep venous thrombosis) ([place]) 1977  . 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 Hyperlipidemia   . Impaired glucose tolerance 11/17/2010  . Migraine   . Osteopenia 09/18/2010  . Post-operative nausea and vomiting   . RADIAL NERVE INJURY   . Raynaud's disease   . RAYNAUD'S DISEASE   . SUPERFICIAL PHLEBITIS 09/01/2007    Past Surgical History:  Procedure Laterality Date  . BACK SURGERY  2002  . BREAST LUMPECTOMY Right 2011   axillary node dissection  . BREAST SURGERY  1989/2001   implants  . [place]  . PARS PLANA VITRECTOMY Left 2013   w/Membranectomy  . TONSILLECTOMY    . TOTAL ABDOMINAL HYSTERECTOMY W/ BILATERAL SALPINGOOPHORECTOMY  1983   secondary to edometriosis    Allergies  Allergen Reactions  . Dextromethorphan Swelling  . Ciprofloxacin Other (See Comments)    Pt denies  . Codeine Nausea Only  . Hydrocodone Nausea And Vomiting    Pt indicates N/V occurs with all narcotics.   . Sulfonamide Derivatives Nausea Only  . Aflibercept Rash    Family History  Problem Relation Age of Onset  . Stroke Mother   . Hypertension Mother   . Heart disease Father   . Cancer Father   . Esophageal  cancer Father   . Cancer Sister   . Stomach cancer Sister   . Breast cancer Other   . Bone cancer Other   . Stomach cancer Other   . Colon cancer Neg Hx   . Rectal cancer Neg Hx      Social History   Social History  . Marital status: Married    Spouse name: N/A  . Number of children: 1  . Years of education: N/A   Occupational History  . 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 Smoking status: Former Research scientist (life sciences)  . Smokeless tobacco: Never Used  . Alcohol use No  . Drug use: No  . Sexual activity: Not on file   Other Topics Concern  . Not on file   Social History Narrative  . No narrative on file     PHYSICAL EXAM:  VS: BP 138/74 (BP Location: Left Arm, Patient Position: Sitting, Cuff Size: Normal)   Pulse 69   Ht 5\' 2"  (1.575 m)   Wt 133 lb (60.3 kg)   SpO2 99%   BMI 24.33 kg/m  Physical Exam PHYSICAL EXAM: Gen: NAD, alert, cooperative with exam, well-appearing ENT: normal lips, normal nasal mucosa,  Eye: PERRL, normal conjunctiva and lids CV:  no edema, +2 pedal pulses  Resp: no accessory muscle use, non-labored,  Skin: no rashes, no areas of induration  Neuro: normal tone, normal sensation to touch Psych:  normal insight, alert and oriented MSK:  Right foot:  Pain with flexion of the IP joint of the right first great toe   Aspiration/Injection Procedure Note Jaime Young 01/08/1951  Procedure: Injection Indications: Right interphalangeal joint pain of the great toe  Procedure Details Consent: Risks of procedure as well as the alternatives and risks of each were explained to the (patient/caregiver).  Consent for procedure obtained. Time Out: Verified patient identification, verified procedure, site/side was marked, verified correct patient position, special equipment/implants available, medications/allergies/relevent history reviewed, required imaging and test results available.  Performed.  The area was cleaned  with iodine and alcohol swabs.    The right interphalangeal joint of the great toe was injected using 0.5 cc's of 40 mg Depomedrol and 0.5 cc's of 1% lidocaine with a 27 1/2" needle.  Ultrasound was used. Images were obtained in Transverse view showing the injection.    A sterile dressing was applied.  Patient did tolerate procedure well.      ASSESSMENT & PLAN:   Great toe pain, right Injection of the interphalangeal joint today. She will follow-up as needed. - Could consider custom orthotic going forward.

## 2017-01-15 NOTE — Addendum Note (Signed)
Addended by: Raford Pitcher R on: 01/15/2017 10:21 AM   Modules accepted: Orders

## 2017-03-23 ENCOUNTER — Emergency Department (HOSPITAL_COMMUNITY): Payer: Medicare Other

## 2017-03-23 ENCOUNTER — Encounter (HOSPITAL_COMMUNITY): Payer: Self-pay | Admitting: Emergency Medicine

## 2017-03-23 ENCOUNTER — Emergency Department (HOSPITAL_COMMUNITY)
Admission: EM | Admit: 2017-03-23 | Discharge: 2017-03-23 | Disposition: A | Discharge status: Home / Self Care | Payer: Medicare Other | Attending: Emergency Medicine | Admitting: Emergency Medicine

## 2017-03-23 DIAGNOSIS — J40 Bronchitis, not specified as acute or chronic: Secondary | ICD-10-CM | POA: Insufficient documentation

## 2017-03-23 DIAGNOSIS — C50411 Malignant neoplasm of upper-outer quadrant of right female breast: Secondary | ICD-10-CM | POA: Insufficient documentation

## 2017-03-23 DIAGNOSIS — M542 Cervicalgia: Secondary | ICD-10-CM | POA: Diagnosis not present

## 2017-03-23 DIAGNOSIS — R0602 Shortness of breath: Secondary | ICD-10-CM | POA: Diagnosis not present

## 2017-03-23 DIAGNOSIS — Z79899 Other long term (current) drug therapy: Secondary | ICD-10-CM | POA: Diagnosis not present

## 2017-03-23 DIAGNOSIS — Z87891 Personal history of nicotine dependence: Secondary | ICD-10-CM | POA: Insufficient documentation

## 2017-03-23 LAB — BASIC METABOLIC PANEL
Anion gap: 14 (ref 5–15)
BUN: 9 mg/dL (ref 6–20)
CHLORIDE: 105 mmol/L (ref 101–111)
CO2: 23 mmol/L (ref 22–32)
Calcium: 8.9 mg/dL (ref 8.9–10.3)
Creatinine, Ser: 0.79 mg/dL (ref 0.44–1.00)
GFR calc non Af Amer: >60 mL/min (ref >60)
Glucose, Bld: 102 mg/dL — ABNORMAL HIGH (ref 65–99)
POTASSIUM: 3.1 mmol/L — AB (ref 3.5–5.1)
Sodium: 142 mmol/L (ref 135–145)

## 2017-03-23 LAB — CBC WITH DIFFERENTIAL/PLATELET
Basophils Absolute: 0 10*3/uL (ref 0.0–0.1)
Basophils Relative: 0 %
Eosinophils Absolute: 0.1 10*3/uL (ref 0.0–0.7)
Eosinophils Relative: 1 %
HEMATOCRIT: 39 % (ref 36.0–46.0)
HEMOGLOBIN: 13.2 g/dL (ref 12.0–15.0)
LYMPHS ABS: 1 10*3/uL (ref 0.7–4.0)
Lymphocytes Relative: 9 %
MCH: 30.8 pg (ref 26.0–34.0)
MCHC: 33.8 g/dL (ref 30.0–36.0)
MCV: 91.1 fL (ref 78.0–100.0)
MONOS PCT: 8 %
Monocytes Absolute: 0.9 10*3/uL (ref 0.1–1.0)
NEUTROS ABS: 9.4 10*3/uL — AB (ref 1.7–7.7)
Neutrophils Relative %: 82 %
Platelets: 189 10*3/uL (ref 150–400)
RBC: 4.28 MIL/uL (ref 3.87–5.11)
RDW: 12.3 % (ref 11.5–15.5)
WBC: 11.4 10*3/uL — AB (ref 4.0–10.5)

## 2017-03-23 LAB — I-STAT CG4 LACTIC ACID, ED: LACTIC ACID, VENOUS: 1.33 mmol/L (ref 0.5–1.9)

## 2017-03-23 MED ORDER — POTASSIUM CHLORIDE 10 MEQ/100ML IV SOLN
10.0000 meq | Freq: Once | INTRAVENOUS | Status: AC
Start: 2017-03-23 — End: 2017-03-23
  Administered 2017-03-23: 10 meq via INTRAVENOUS
  Filled 2017-03-23: qty 100

## 2017-03-23 MED ORDER — METHYLPREDNISOLONE SODIUM SUCC 125 MG IJ SOLR
125.0000 mg | Freq: Once | INTRAMUSCULAR | Status: AC
  Administered 2017-03-23: 125 mg via INTRAVENOUS
  Filled 2017-03-23: qty 2

## 2017-03-23 MED ORDER — PREDNISONE 50 MG PO TABS: 50.0000 mg | ORAL_TABLET | Freq: Every day | ORAL | 0 refills | Status: DC

## 2017-03-23 MED ORDER — POTASSIUM CHLORIDE CRYS ER 20 MEQ PO TBCR
40.0000 meq | EXTENDED_RELEASE_TABLET | Freq: Once | ORAL | Status: AC
  Administered 2017-03-23: 40 meq via ORAL
  Filled 2017-03-23: qty 2

## 2017-03-23 MED ORDER — SODIUM CHLORIDE 0.9 % IV BOLUS (SEPSIS)
1000.0000 mL | Freq: Once | INTRAVENOUS | Status: AC
Start: 2017-03-23 — End: 2017-03-23
  Administered 2017-03-23: 1000 mL via INTRAVENOUS

## 2017-03-23 MED ORDER — RACEPINEPHRINE HCL 2.25 % IN NEBU
0.5000 mL | INHALATION_SOLUTION | Freq: Once | RESPIRATORY_TRACT | Status: AC
  Administered 2017-03-23: 0.5 mL via RESPIRATORY_TRACT
  Filled 2017-03-23: qty 0.5

## 2017-03-23 NOTE — ED Notes (Signed)
ED Provider at bedside. 

## 2017-03-23 NOTE — Discharge Instructions (Signed)
Return if you are having any problems with your breathing.

## 2017-03-23 NOTE — ED Notes (Signed)
Returned from xray

## 2017-03-23 NOTE — ED Provider Notes (Signed)
[facility] DEPT Provider Note   CSN: [id number] Arrival date & time: 03/23/17  0330     History   Chief Complaint Chief Complaint  Patient presents with  . Shortness of Breath    HPI Jaime Young is a 66 y.o. female.  The history is provided by the patient.  She complains of difficulty breathing and croupy cough which started yesterday afternoon. She started having cold symptoms with rhinorrhea and mild cough 3 days ago. Cough changed character to a croupy type of cough yesterday afternoon. She now feels like she is smothering. She denies fever or chills. She denies nausea or vomiting. She has not been able to raise any sputum. She has been using her home inhaler with minimal relief. She has had similar episodes in the past to be admitted to the hospital for them.  Past Medical History:  Diagnosis Date  . Allergic rhinitis   . Anxiety   . Cancer ([place])    stage I right breast cancer  . Diverticulitis   . Diverticulosis of colon   . DJD (degenerative joint disease) of lumbar spine   . DVT (deep venous thrombosis) ([place]) 1977  . 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 Hyperlipidemia   . Impaired glucose tolerance 11/17/2010  . Migraine   . Osteopenia 09/18/2010  . Post-operative nausea and vomiting   . RADIAL NERVE INJURY   . Raynaud's disease   . RAYNAUD'S DISEASE   . SUPERFICIAL PHLEBITIS 09/01/2007    Patient Active Problem List   Diagnosis Date Noted  . Great toe pain, right 01/09/2017  . Microhematuria 08/20/2016  . Acute sinus infection 07/31/2015  . Preventative health care 11/28/2014  . History of DVT (deep vein thrombosis) 08/29/2013  . Hypokalemia 11/04/2011  . Impaired glucose tolerance 11/17/2010  . Osteopenia 09/18/2010  . Malignant neoplasm of upper-outer quadrant of right breast in female, estrogen receptor positive ([place]) 07/16/2010  . RADIAL NERVE INJURY 03/14/2008  . Hyperlipidemia 09/01/2007  . Anxiety state 09/01/2007  . RAYNAUD'S  DISEASE 09/01/2007  . HEMORRHOIDS 09/01/2007  . Allergic rhinitis 09/01/2007  . DIVERTICULOSIS, COLON 09/01/2007  . DIVERTICULITIS, HX OF 09/01/2007  . MIGRAINES, HX OF 09/01/2007    Past Surgical History:  Procedure Laterality Date  . BACK SURGERY  2002  . BREAST LUMPECTOMY Right 2011   axillary node dissection  . BREAST SURGERY  1989/2001   implants  . [place]  . PARS PLANA VITRECTOMY Left 2013   w/Membranectomy  . TONSILLECTOMY    . TOTAL ABDOMINAL HYSTERECTOMY W/ BILATERAL SALPINGOOPHORECTOMY  1983   secondary to edometriosis    OB History    No data available       Home Medications    Prior to Admission medications   Medication Sig Start Date End Date Taking? Authorizing Provider  brimonidine-timolol (COMBIGAN) 0.2-0.5 % ophthalmic solution Place 1 drop into both eyes at bedtime.     [provider]  Calcium-Magnesium-Vitamin D (CALCIUM 500) [phone] MG-MG-UNIT TABS Take 3 tablets by mouth daily.    [provider]  cyanocobalamin 500 MCG tablet Take 500 mcg by mouth daily.    [provider]  Multiple Vitamins tablet Take 1 tablet by mouth daily. Reported on 08/02/2015    [provider]  Omega-3 Fatty Acids (FISH OIL) 300 MG CAPS Take 1 capsule by mouth daily.    [provider]  tamoxifen (NOLVADEX) 20 MG tablet Take 1 tablet (20 mg total)  by mouth daily. 09/23/16   Magrinat, [name], MD  vitamin C (ASCORBIC ACID) 500 MG tablet Take 1,000 mg by mouth daily.    [provider]  vitamin E (VITAMIN E) 400 UNIT capsule Take 400 Units by mouth daily.    [provider]    Family History Family History  Problem Relation Age of Onset  . Stroke Mother   . Hypertension Mother   . Heart disease Father   . Cancer Father   . Esophageal cancer Father   . Cancer Sister   . Stomach cancer Sister   . Breast cancer Other   . Bone cancer Other   . Stomach cancer Other   . Colon cancer Neg  Hx   . Rectal cancer Neg Hx     Social History Social History  Substance Use Topics  . Smoking status: Former Research scientist (life sciences)  . Smokeless tobacco: Never Used  . Alcohol use No     Allergies   Dextromethorphan; Ciprofloxacin; Codeine; Hydrocodone; Sulfonamide derivatives; and Aflibercept   Review of Systems Review of Systems  All other systems reviewed and are negative.    Physical Exam Updated Vital Signs BP (!) 143/86 (BP Location: Left Arm)   Pulse (!) 133   Temp 99.8 F (37.7 C) (Oral)   Resp (!) 23   Ht 5\' 2"  (1.575 m)   Wt 58.1 kg (128 lb)   SpO2 99% Comment: room air  BMI 23.41 kg/m   Physical Exam  Nursing note and vitals reviewed.  66 year old female, somewhat anxious, but is in no acute distress. Vital signs are significant for tachycardia, tachypnea, and  borderline hypertension. Oxygen saturation is 99%, which is normal. Head is normocephalic and atraumatic. PERRLA, EOMI. Oropharynx shows mild edema of the uvula. No pooling of secretions. Mild stridor is present as well as croupy cough. Neck is nontender and supple without adenopathy or JVD. Back is nontender and there is no CVA tenderness. Lungs are clear without rales, wheezes, or rhonchi. Chest is nontender. Heart has regular rate and rhythm without murmur. Abdomen is soft, flat, nontender without masses or hepatosplenomegaly and peristalsis is normoactive. Extremities have 1+ edema, full range of motion is present. Skin is warm and dry without rash. Neurologic: Mental status is normal, cranial nerves are intact, there are no motor or sensory deficits.  ED Treatments / Results  Labs (all labs ordered are listed, but only abnormal results are displayed) Labs Reviewed  CBC WITH DIFFERENTIAL/PLATELET - Abnormal; Notable for the following:       Result Value   WBC 11.4 (*)    Neutro Abs 9.4 (*)    All other components within normal limits  BASIC METABOLIC PANEL - Abnormal; Notable for the following:     Potassium 3.1 (*)    Glucose, Bld 102 (*)    All other components within normal limits  I-STAT CG4 LACTIC ACID, ED   Radiology Dg Neck Soft Tissue  Result Date: 03/23/2017 CLINICAL DATA:  Acute onset of stridor. Sensation of throat closing up. Initial encounter. EXAM: NECK SOFT TISSUES - 1+ VIEW COMPARISON:  Maxillofacial CT performed 08/02/2015 FINDINGS: The nasopharynx, oropharynx and hypopharynx are unremarkable. The epiglottis is normal in thickness. The prevertebral soft tissues are within normal limits. The proximal trachea is grossly unremarkable. Mild anterior osteophyte formation is noted along the lower cervical spine. No acute osseous abnormalities are seen. The visualized lung apices are grossly clear. IMPRESSION: Unremarkable radiographs of the soft tissues of the neck.  Electronically Signed   By: Garald Balding M.D.   On: 03/23/2017 04:46   Dg Chest 2 View  Result Date: 03/23/2017 CLINICAL DATA:  Acute onset of shortness of breath. Initial encounter. EXAM: CHEST  2 VIEW COMPARISON:  Chest radiograph performed 08/02/2015 FINDINGS: The lungs are well-aerated and clear. There is no evidence of focal opacification, pleural effusion or pneumothorax. A stable 5 mm nodular density is noted at the left midlung zone, likely reflecting a calcified granuloma. The heart is normal in size; the mediastinal contour is within normal limits. No acute osseous abnormalities are seen. Postoperative change is noted about the right axilla and right breast. IMPRESSION: No acute cardiopulmonary process seen. Electronically Signed   By: Garald Balding M.D.   On: 03/23/2017 04:45    Procedures Procedures (including critical care time)  Medications Ordered in ED Medications  Racepinephrine HCl 2.25 % nebulizer solution 0.5 mL (not administered)  methylPREDNISolone sodium succinate (SOLU-MEDROL) 125 mg/2 mL injection 125 mg (not administered)  sodium chloride 0.9 % bolus 1,000 mL (not administered)      Initial Impression / Assessment and Plan / ED Course  I have reviewed the triage vital signs and the nursing notes.  Pertinent labs & imaging results that were available during my care of the patient were reviewed by me and considered in my medical decision making (see chart for details).  Stridor related to upper respiratory infection. Old records are reviewed confirming hospitalization for tracheobronchitis in 2011. She required BiPAP at that time. She is given nebulizer treatment with racemic epinephrine, and also given a dose of methylprednisolone. We'll check chest x-ray and soft tissue neck x-ray. At this point, I do not see indication for antibiotics.  X-rays were unremarkable. Following above-noted treatment, stridor was much improved. Cough is no longer has a croupy sound. She is observed in the emergency department 44 hours. She was felt to be stable enough to go home. She maintain adequate oxygen saturation at all times. She is discharged with prescription for prednisone. I am referring her to ENT for follow-up. I feel she might benefit from fiberoptic laryngoscopy to see if there is any structural problem that might be exacerbated during respiratory infections. Return precautions discussed. Patient expressed understanding.  Final Clinical Impressions(s) / ED Diagnoses   Final diagnoses:  Tracheobronchitis    New Prescriptions New Prescriptions   PREDNISONE (DELTASONE) 50 MG TABLET    Take 1 tablet (50 mg total) by mouth daily.     Delora Fuel, MD 72/62/03 [phone]

## 2017-03-23 NOTE — ED Triage Notes (Signed)
Pt reports having increasing shortness of breath since 2000 on 03/21/17 and has used Xopenex inhaler that provided 66min of relief. Pt states it feels as if throat is closing up. No prior hx of intubation.

## 2017-03-23 NOTE — ED Notes (Signed)
Patient transported to X-ray 

## 2017-03-24 ENCOUNTER — Ambulatory Visit (INDEPENDENT_AMBULATORY_CARE_PROVIDER_SITE_OTHER): Payer: Medicare Other | Admitting: Nurse Practitioner

## 2017-03-24 ENCOUNTER — Encounter: Payer: Self-pay | Admitting: Nurse Practitioner

## 2017-03-24 ENCOUNTER — Telehealth: Payer: Self-pay | Admitting: Internal Medicine

## 2017-03-24 VITALS — BP 140/80 | HR 83 | Temp 98.2°F | Ht 62.0 in | Wt 135.0 lb

## 2017-03-24 DIAGNOSIS — J209 Acute bronchitis, unspecified: Secondary | ICD-10-CM

## 2017-03-24 MED ORDER — BENZONATATE 100 MG PO CAPS: 100.0000 mg | ORAL_CAPSULE | Freq: Three times a day (TID) | ORAL | 0 refills | Status: DC | PRN

## 2017-03-24 MED ORDER — CETIRIZINE HCL 10 MG PO TABS: 10.0000 mg | ORAL_TABLET | Freq: Every day | ORAL | 0 refills | Status: DC

## 2017-03-24 MED ORDER — RANITIDINE HCL 150 MG PO TABS: 150.0000 mg | ORAL_TABLET | Freq: Two times a day (BID) | ORAL | 0 refills | Status: DC

## 2017-03-24 MED ORDER — IPRATROPIUM-ALBUTEROL 0.5-2.5 (3) MG/3ML IN SOLN
3.0000 mL | Freq: Once | RESPIRATORY_TRACT | Status: AC
  Administered 2017-03-24: 3 mL via RESPIRATORY_TRACT

## 2017-03-24 MED ORDER — ALBUTEROL SULFATE HFA 108 (90 BASE) MCG/ACT IN AERS: 1.0000 | INHALATION_SPRAY | Freq: Four times a day (QID) | RESPIRATORY_TRACT | 0 refills | Status: DC | PRN

## 2017-03-24 MED ORDER — METHYLPREDNISOLONE ACETATE 40 MG/ML IJ SUSP
40.0000 mg | Freq: Once | INTRAMUSCULAR | Status: AC
  Administered 2017-03-24: 40 mg via INTRAMUSCULAR

## 2017-03-24 NOTE — Progress Notes (Signed)
Subjective:  Patient ID: Jaime Young, female    DOB: December 27, 1950  Age: 66 y.o. MRN: [id number]  CC: Cough (still coughing, SOB/went to ER yesterday--had breathing treatment-prednison--K low--dx with tracheobronchitis/)   Cough  This is a new problem. The current episode started in the past 7 days (3days ago). The problem has been gradually worsening. The problem occurs constantly. The cough is non-productive. Associated symptoms include myalgias, a sore throat, shortness of breath and wheezing. Pertinent negatives include no chest pain, chills, fever, headaches, heartburn, hemoptysis, nasal congestion, postnasal drip, rash, rhinorrhea, sweats or weight loss. The symptoms are aggravated by lying down. She has tried oral steroids, rest and cool air for the symptoms. The treatment provided mild relief. Her past medical history is significant for bronchitis. There is no history of pneumonia.   Diagnosed with tracheobronchitis in past, unknown caused. She will like referral to ENT.  Reviewed lab results and radiology results done by yesterday.  Outpatient Medications Prior to Visit  Medication Sig Dispense Refill  . brimonidine-timolol (COMBIGAN) 0.2-0.5 % ophthalmic solution Place 1 drop into both eyes at bedtime.     . Calcium-Magnesium-Vitamin D (CALCIUM 500) 500-250-200 MG-MG-UNIT TABS Take 3 tablets by mouth daily.    . cyanocobalamin 500 MCG tablet Take 500 mcg by mouth daily.    . Multiple Vitamins tablet Take 1 tablet by mouth daily. Reported on 08/02/2015    . Omega-3 Fatty Acids (FISH OIL) 300 MG CAPS Take 1 capsule by mouth daily.    . predniSONE (DELTASONE) 50 MG tablet Take 1 tablet (50 mg total) by mouth daily. 5 tablet 0  . tamoxifen (NOLVADEX) 20 MG tablet Take 1 tablet (20 mg total) by mouth daily. 90 tablet 2  . vitamin C (ASCORBIC ACID) 500 MG tablet Take 1,000 mg by mouth daily.    . vitamin E (VITAMIN E) 400 UNIT capsule Take 400 Units by mouth daily.     No  facility-administered medications prior to visit.     ROS See HPI  Objective:  BP 140/80   Pulse 83   Temp 98.2 F (36.8 C)   Ht 5\' 2"  (1.575 m)   Wt 135 lb (61.2 kg)   SpO2 99%   BMI 24.69 kg/m   BP Readings from Last 3 Encounters:  03/24/17 140/80  03/23/17 127/71  01/15/17 138/74    Wt Readings from Last 3 Encounters:  03/24/17 135 lb (61.2 kg)  03/23/17 128 lb (58.1 kg)  01/15/17 133 lb (60.3 kg)    Physical Exam  Constitutional: She is oriented to person, place, and time.  HENT:  Right Ear: Tympanic membrane, external ear and ear canal normal.  Left Ear: Tympanic membrane, external ear and ear canal normal.  Nose: No mucosal edema or rhinorrhea. Right sinus exhibits no maxillary sinus tenderness and no frontal sinus tenderness. Left sinus exhibits no maxillary sinus tenderness and no frontal sinus tenderness.  Mouth/Throat: Uvula is midline. No trismus in the jaw. Posterior oropharyngeal erythema present. No oropharyngeal exudate or posterior oropharyngeal edema.  Eyes: No scleral icterus.  Neck: Normal range of motion. Neck supple. No tracheal deviation present. No thyromegaly present.  Cardiovascular: Normal rate and normal heart sounds.   Pulmonary/Chest: Effort normal. No stridor. No respiratory distress. She has wheezes. She has no rales.  Musculoskeletal: She exhibits no edema.  Lymphadenopathy:    She has no cervical adenopathy.  Neurological: She is alert and oriented to person, place, and time.  Vitals reviewed.   Lab  Results  Component Value Date   WBC 11.4 (H) 03/23/2017   HGB 13.2 03/23/2017   HCT 39.0 03/23/2017   PLT 189 03/23/2017   GLUCOSE 102 (H) 03/23/2017   CHOL 211 (H) 08/20/2016   TRIG 121.0 08/20/2016   HDL 53.80 08/20/2016   LDLDIRECT 160.6 12/20/2007   LDLCALC 133 (H) 08/20/2016   ALT 17 09/16/2016   AST 21 09/16/2016   NA 142 03/23/2017   K 3.1 (L) 03/23/2017   CL 105 03/23/2017   CREATININE 0.79 03/23/2017   BUN 9  03/23/2017   CO2 23 03/23/2017   TSH 2.40 08/20/2016   INR 1.00 02/04/2010    Dg Neck Soft Tissue  Result Date: 03/23/2017 CLINICAL DATA:  Acute onset of stridor. Sensation of throat closing up. Initial encounter. EXAM: NECK SOFT TISSUES - 1+ VIEW COMPARISON:  Maxillofacial CT performed 08/02/2015 FINDINGS: The nasopharynx, oropharynx and hypopharynx are unremarkable. The epiglottis is normal in thickness. The prevertebral soft tissues are within normal limits. The proximal trachea is grossly unremarkable. Mild anterior osteophyte formation is noted along the lower cervical spine. No acute osseous abnormalities are seen. The visualized lung apices are grossly clear. IMPRESSION: Unremarkable radiographs of the soft tissues of the neck. Electronically Signed   By: Garald Balding M.D.   On: 03/23/2017 04:46   Dg Chest 2 View  Result Date: 03/23/2017 CLINICAL DATA:  Acute onset of shortness of breath. Initial encounter. EXAM: CHEST  2 VIEW COMPARISON:  Chest radiograph performed 08/02/2015 FINDINGS: The lungs are well-aerated and clear. There is no evidence of focal opacification, pleural effusion or pneumothorax. A stable 5 mm nodular density is noted at the left midlung zone, likely reflecting a calcified granuloma. The heart is normal in size; the mediastinal contour is within normal limits. No acute osseous abnormalities are seen. Postoperative change is noted about the right axilla and right breast. IMPRESSION: No acute cardiopulmonary process seen. Electronically Signed   By: Garald Balding M.D.   On: 03/23/2017 04:45    Assessment & Plan:   [place] was seen today for cough.  Diagnoses and all orders for this visit:  Acute bronchitis, unspecified organism -     ipratropium-albuterol (DUONEB) 0.5-2.5 (3) MG/3ML nebulizer solution 3 mL; Take 3 mLs by nebulization once. -     methylPREDNISolone acetate (DEPO-MEDROL) injection 40 mg; Inject 1 mL (40 mg total) into the muscle once. -      Ambulatory referral to ENT -     albuterol (PROVENTIL HFA;VENTOLIN HFA) 108 (90 Base) MCG/ACT inhaler; Inhale 1-2 puffs into the lungs every 6 (six) hours as needed. -     ranitidine (ZANTAC) 150 MG tablet; Take 1 tablet (150 mg total) by mouth 2 (two) times daily. -     cetirizine (ZYRTEC) 10 MG tablet; Take 1 tablet (10 mg total) by mouth at bedtime. -     benzonatate (TESSALON) 100 MG capsule; Take 1 capsule (100 mg total) by mouth 3 (three) times daily as needed for cough.   I am having Ms. Zehnder start on albuterol, ranitidine, cetirizine, and benzonatate. I am also having her maintain her brimonidine-timolol, vitamin C, Calcium-Magnesium-Vitamin D, Multiple Vitamins, FISH OIL, vitamin E, cyanocobalamin, tamoxifen, and predniSONE. We administered ipratropium-albuterol and methylPREDNISolone acetate.  Meds ordered this encounter  Medications  . ipratropium-albuterol (DUONEB) 0.5-2.5 (3) MG/3ML nebulizer solution 3 mL  . methylPREDNISolone acetate (DEPO-MEDROL) injection 40 mg  . albuterol (PROVENTIL HFA;VENTOLIN HFA) 108 (90 Base) MCG/ACT inhaler    Sig: Inhale 1-2  puffs into the lungs every 6 (six) hours as needed.    Dispense:  1 Inhaler    Refill:  0    Order Specific Question:   Supervising Provider    Answer:   Cassandria Anger [[id number]]  . ranitidine (ZANTAC) 150 MG tablet    Sig: Take 1 tablet (150 mg total) by mouth 2 (two) times daily.    Dispense:  60 tablet    Refill:  0    Order Specific Question:   Supervising Provider    Answer:   Cassandria Anger [[id number]]  . cetirizine (ZYRTEC) 10 MG tablet    Sig: Take 1 tablet (10 mg total) by mouth at bedtime.    Dispense:  30 tablet    Refill:  0    Order Specific Question:   Supervising Provider    Answer:   Cassandria Anger [[id number]]  . benzonatate (TESSALON) 100 MG capsule    Sig: Take 1 capsule (100 mg total) by mouth 3 (three) times daily as needed for cough.    Dispense:  20 capsule    Refill:  0    Order Specific  Question:   Supervising Provider    Answer:   Cassandria Anger [[id number]]    Follow-up: Return if symptoms worsen or fail to improve.  Wilfred Lacy, NP

## 2017-03-24 NOTE — Telephone Encounter (Signed)
Patient Name: Jaime Young  DOB: 23-Aug-1950    Initial Comment Caller states wants appt for today; has deep cough from trachia area; went to ED yesterday 3am; they gave breathing treatment; 5 days of prednisone; coughed all night for 3rd night;    Nurse Assessment  Nurse: Leilani Merl, RN, Heather Date/Time (Eastern Time): 03/24/2017 8:42:48 AM  Confirm and document reason for call. If symptomatic, describe symptoms. ---Caller states wants appt for today; has deep cough from trachea area; went to ED yesterday 3am; they gave breathing treatment; 5 days of prednisone; coughed all night for 3rd night; She was diagnosed with bronchitis  Does the patient have any new or worsening symptoms? ---Yes  Will a triage be completed? ---Yes  Related visit to physician within the last 2 weeks? ---No  Does the PT have any chronic conditions? (i.e. diabetes, asthma, etc.) ---Yes  List chronic conditions. ---See MR  Is this a behavioral health or substance abuse call? ---No     Guidelines    Guideline Title Affirmed Question Affirmed Notes  Cough - Chronic SEVERE coughing spells (e.g., whooping sound after coughing, vomiting after coughing)    Final Disposition User   See Physician within 24 Hours Standifer, RN, Water quality scientist    Comments  Appt with [facility] at 1045 this morning.   Referrals  REFERRED TO PCP OFFICE   Caller Disagree/Comply Comply  Caller Understands Yes  PreDisposition Call Doctor

## 2017-03-24 NOTE — Patient Instructions (Signed)
You will be contacted to schedule appt with ENT.  Encourage adequate oral hydration.  Complete oral prednisone as prescribed.   Acute Bronchitis, Adult Acute bronchitis is when air tubes (bronchi) in the lungs suddenly get swollen. The condition can make it hard to breathe. It can also cause these symptoms:  A cough.  Coughing up clear, yellow, or green mucus.  Wheezing.  Chest congestion.  Shortness of breath.  A fever.  Body aches.  Chills.  A sore throat.  Follow these instructions at home: Medicines  Take over-the-counter and prescription medicines only as told by your doctor.  If you were prescribed an antibiotic medicine, take it as told by your doctor. Do not stop taking the antibiotic even if you start to feel better. General instructions  Rest.  Drink enough fluids to keep your pee (urine) clear or pale yellow.  Avoid smoking and secondhand smoke. If you smoke and you need help quitting, ask your doctor. Quitting will help your lungs heal faster.  Use an inhaler, cool mist vaporizer, or humidifier as told by your doctor.  Keep all follow-up visits as told by your doctor. This is important. How is this prevented? To lower your risk of getting this condition again:  Wash your hands often with soap and water. If you cannot use soap and water, use hand sanitizer.  Avoid contact with people who have cold symptoms.  Try not to touch your hands to your mouth, nose, or eyes.  Make sure to get the flu shot every year.  Contact a doctor if:  Your symptoms do not get better in 2 weeks. Get help right away if:  You cough up blood.  You have chest pain.  You have very bad shortness of breath.  You become dehydrated.  You faint (pass out) or keep feeling like you are going to pass out.  You keep throwing up (vomiting).  You have a very bad headache.  Your fever or chills gets worse. This information is not intended to replace advice given to you  by your health care provider. Make sure you discuss any questions you have with your health care provider. Document Released: 11/19/2007 Document Revised: 01/09/2016 Document Reviewed: 11/21/2015 Elsevier Interactive Patient Education  2017 [employer].

## 2017-03-27 ENCOUNTER — Ambulatory Visit (INDEPENDENT_AMBULATORY_CARE_PROVIDER_SITE_OTHER): Payer: Medicare Other | Admitting: Physician Assistant

## 2017-03-27 ENCOUNTER — Ambulatory Visit (HOSPITAL_BASED_OUTPATIENT_CLINIC_OR_DEPARTMENT_OTHER)
Admission: RE | Admit: 2017-03-27 | Discharge: 2017-03-27 | Disposition: A | Discharge status: Home / Self Care | Payer: Medicare Other | Source: Ambulatory Visit | Attending: Physician Assistant | Admitting: Physician Assistant

## 2017-03-27 ENCOUNTER — Encounter (HOSPITAL_BASED_OUTPATIENT_CLINIC_OR_DEPARTMENT_OTHER): Payer: Self-pay

## 2017-03-27 ENCOUNTER — Encounter: Payer: Self-pay | Admitting: Physician Assistant

## 2017-03-27 ENCOUNTER — Telehealth: Payer: Self-pay | Admitting: Physician Assistant

## 2017-03-27 VITALS — BP 126/78 | HR 83 | Resp 16 | Ht 62.0 in | Wt 130.0 lb

## 2017-03-27 DIAGNOSIS — J029 Acute pharyngitis, unspecified: Secondary | ICD-10-CM | POA: Diagnosis not present

## 2017-03-27 DIAGNOSIS — E876 Hypokalemia: Secondary | ICD-10-CM

## 2017-03-27 DIAGNOSIS — J40 Bronchitis, not specified as acute or chronic: Secondary | ICD-10-CM

## 2017-03-27 DIAGNOSIS — Z131 Encounter for screening for diabetes mellitus: Secondary | ICD-10-CM

## 2017-03-27 DIAGNOSIS — Z299 Encounter for prophylactic measures, unspecified: Secondary | ICD-10-CM | POA: Diagnosis not present

## 2017-03-27 DIAGNOSIS — R070 Pain in throat: Secondary | ICD-10-CM | POA: Diagnosis not present

## 2017-03-27 LAB — POCT GLYCOSYLATED HEMOGLOBIN (HGB A1C): HEMOGLOBIN A1C: 4.9

## 2017-03-27 LAB — POTASSIUM: Potassium: 3.2 mmol/L — ABNORMAL LOW (ref 3.5–5.2)

## 2017-03-27 MED ORDER — ALBUTEROL SULFATE (2.5 MG/3ML) 0.083% IN NEBU
2.5000 mg | INHALATION_SOLUTION | Freq: Once | RESPIRATORY_TRACT | Status: AC
  Administered 2017-03-27: 2.5 mg via RESPIRATORY_TRACT

## 2017-03-27 MED ORDER — HYDROCODONE-HOMATROPINE 5-1.5 MG/5ML PO SYRP: 2.5000 mL | ORAL_SOLUTION | Freq: Every day | ORAL | 0 refills | Status: AC

## 2017-03-27 MED ORDER — IOPAMIDOL (ISOVUE-300) INJECTION 61%
100.0000 mL | Freq: Once | INTRAVENOUS | Status: AC | PRN
  Administered 2017-03-27: 75 mL via INTRAVENOUS

## 2017-03-27 MED ORDER — METHYLPREDNISOLONE SODIUM SUCC 125 MG IJ SOLR
125.0000 mg | Freq: Once | INTRAMUSCULAR | Status: AC
  Administered 2017-03-27: 125 mg via INTRAVENOUS

## 2017-03-27 MED ORDER — ONDANSETRON HCL 4 MG PO TABS: 4.0000 mg | ORAL_TABLET | Freq: Three times a day (TID) | ORAL | 0 refills | Status: DC | PRN

## 2017-03-27 MED ORDER — AZITHROMYCIN 200 MG/5ML PO SUSR: 500.0000 mg | Freq: Every day | ORAL | 0 refills | Status: DC

## 2017-03-27 MED ORDER — PREDNISONE 5 MG/5ML PO SOLN: 40.0000 mg | Freq: Every day | ORAL | 0 refills | Status: DC

## 2017-03-27 MED ORDER — POTASSIUM CHLORIDE ER 10 MEQ PO TBCR: 10.0000 meq | EXTENDED_RELEASE_TABLET | Freq: Every day | ORAL | 0 refills | Status: DC

## 2017-03-27 NOTE — Patient Instructions (Addendum)
You are scheduled for a CT scan at the [facility] in [facility] at 2 pm today. Please arrive 15 min prior to your appointment time.  [facility] [place] [address] Phone: [phone]   Go the the pharmacy and pick up azithromycin, cough syrup, and prednisone.  Use your albuterol every 4-6 hours as needed. I will call [phone] once I have a CT scan result.   IF you received an x-ray today, you will receive an invoice from [facility]. Please contact [facility] at [phone] with questions or concerns regarding your invoice.   IF you received labwork today, you will receive an invoice from [facility]. Please contact [facility] at [phone] with questions or concerns regarding your invoice.   Our billing staff will not be able to assist you with questions regarding bills from these companies.  You will be contacted with the lab results as soon as they are available. The fastest way to get your results is to activate your My Chart account. Instructions are located on the last page of this paperwork. If you have not heard from [place] regarding the results in 2 weeks, please contact this office.

## 2017-03-27 NOTE — Progress Notes (Signed)
03/27/2017 5:42 PM   DOB: 1951/04/07 / MRN: [id number]  SUBJECTIVE:  Jaime Young is a 66 y.o. former smoker female presenting for cough.  Has been seen in both the ED and by her PCP, all of which have administered steroids and she tells me that she continues to feel poorly. Rads were negative for any significant abnormalities in the ED and this inluded a chest and neck film. She was given 125 IM solumedrol there along with pred 50 which she is continued on today. No evidence of diabetes in her chart.  This all started about 6 days ago and is not really getting better despite treatment.    She is allergic to dextromethorphan; ciprofloxacin; codeine; hydrocodone; sulfonamide derivatives; and aflibercept.   She  has a past medical history of Allergic rhinitis; Anxiety; Cancer ([place]); Diverticulitis; Diverticulosis of colon; DJD (degenerative joint disease) of lumbar spine; DVT (deep venous thrombosis) ([place]) (1977); Hearing loss of both ears; Hyperlipidemia; Impaired glucose tolerance (11/17/2010); Migraine; Osteopenia (09/18/2010); Post-operative nausea and vomiting; RADIAL NERVE INJURY; Raynaud's disease; RAYNAUD'S DISEASE; and SUPERFICIAL PHLEBITIS (09/01/2007).    She  reports that she has quit smoking. She has never used smokeless tobacco. She reports that she does not drink alcohol or use drugs. She  has no sexual activity history on file. The patient  has a past surgical history that includes Total abdominal hysterectomy w/ bilateral salpingoophorectomy (7035); Back surgery (2002); Hemorrhoid surgery (1983); Tonsillectomy; Breast surgery (1989/2001); Breast lumpectomy (Right, 2011); and Pars plana vitrectomy (Left, 2013).  Her family history includes Bone cancer in her other; Breast cancer in her other; Cancer in her father and sister; Esophageal cancer in her father; Heart disease in her father; Hypertension in her mother; Stomach cancer in her other and sister; Stroke in her mother.  Review of  Systems  Constitutional: Negative for chills, diaphoresis and fever.  Gastrointestinal: Negative for nausea.  Skin: Negative for rash.  Neurological: Negative for dizziness.    The problem list and medications were reviewed and updated by myself where necessary and exist elsewhere in the encounter.   OBJECTIVE:  BP 126/78 (BP Location: Left Arm, Patient Position: Sitting, Cuff Size: Normal)   Pulse 83   Resp 16   Ht 5\' 2"  (1.575 m)   Wt 130 lb (59 kg)   SpO2 100%   BMI 23.78 kg/m   Lab Results  Component Value Date   CREATININE 0.79 03/23/2017   BUN 9 03/23/2017   NA 142 03/23/2017   K 3.2 (L) 03/27/2017   CL 105 03/23/2017   CO2 23 03/23/2017   Lab Results  Component Value Date   WBC 11.4 (H) 03/23/2017   HGB 13.2 03/23/2017   HCT 39.0 03/23/2017   MCV 91.1 03/23/2017   PLT 189 03/23/2017     Physical Exam  Constitutional: She is oriented to person, place, and time. She appears well-developed and well-nourished.  Non-toxic appearance. She has a sickly appearance. She does not appear ill. She appears distressed.  HENT:  Mouth/Throat: Oropharynx is clear and moist. No oropharyngeal exudate.  Cardiovascular: Normal rate and regular rhythm.   Pulmonary/Chest: Effort normal and breath sounds normal.  Musculoskeletal: Normal range of motion.  Neurological: She is alert and oriented to person, place, and time.  Skin: She is not diaphoretic.    Lab Results  Component Value Date   CREATININE 0.79 03/23/2017      Results for orders placed or performed in visit on 03/27/17 (from the past  72 hour(s))  Potassium     Status: Abnormal   Collection Time: 03/27/17 12:12 PM  Result Value Ref Range   Potassium 3.2 (L) 3.5 - 5.2 mmol/L  POCT glycosylated hemoglobin (Hb A1C)     Status: None   Collection Time: 03/27/17 12:38 PM  Result Value Ref Range   Hemoglobin A1C 4.9     Ct Soft Tissue Neck W Contrast  Result Date: 03/27/2017 CLINICAL DATA:  Sore throat.  Stridor. Dry cough. Concern for epiglottitis or tonsillitis. EXAM: CT NECK WITH CONTRAST TECHNIQUE: Multidetector CT imaging of the neck was performed using the standard protocol following the bolus administration of intravenous contrast. CONTRAST:  62mL ISOVUE-300 IOPAMIDOL (ISOVUE-300) INJECTION 61% COMPARISON:  Maxillofacial CT 08/02/2015.  PET-CT 07/25/2009. FINDINGS: Pharynx and larynx: Examination is mildly limited by motion artifact through the oropharynx. The pharyngeal soft tissues are symmetric without evidence of mass or significant swelling. The epiglottis is normal. The larynx is unremarkable. No retropharyngeal fluid collection is present. Salivary glands: No inflammation, mass, or stone. Thyroid: Unremarkable. Lymph nodes: No enlarged or suspicious lymph nodes in the neck. Vascular: Unremarkable. Limited intracranial: Unremarkable. Visualized orbits: Left cataract extraction. Mastoids and visualized paranasal sinuses: Mild-to-moderate ethmoid and mild maxillary sinus mucosal thickening bilaterally. Under pneumatized mastoids. Skeleton: Mild lower cervical disc degeneration. Upper chest: Scarring/ postradiation fibrosis in the right greater than left lung apices. Other: None. IMPRESSION: No acute abnormality identified in the neck. Electronically Signed   By: Logan Bores M.D.   On: 03/27/2017 14:38    ASSESSMENT AND PLAN:  Mychaela was seen today for cough.  Diagnoses and all orders for this visit:  Tracheobronchitis: Patient appears sickly and somewhat distressed today.  I would have expected her symptoms to have improved if there were simply a viral infection. Her history of breast cancer in the setting of this problem also concerns me. I think she needs an advanced image today.  If all is well will continue medical therapy and will add Azithromycin to cover for an atypical infection.  -     methylPREDNISolone sodium succinate (SOLU-MEDROL) 125 mg/2 mL injection 125 mg; Inject 2 mLs (125 mg  total) into the vein once. -     azithromycin (ZITHROMAX) 200 MG/5ML suspension; Take 12.5 mLs (500 mg total) by mouth daily. -     Potassium -     albuterol (PROVENTIL) (2.5 MG/3ML) 0.083% nebulizer solution 2.5 mg; Take 3 mLs (2.5 mg total) by nebulization once. -     CT Soft Tissue Neck W Contrast; Future -     Basic metabolic panel -     predniSONE 5 MG/5ML solution; Take 40 mLs (40 mg total) by mouth daily with breakfast. -     HYDROcodone-homatropine (HYCODAN) 5-1.5 MG/5ML syrup; Take 2.5-5 mLs by mouth at bedtime. -     I-Stat Creatinine; Future  Acute pharyngitis, unspecified etiology -     Cancel: CT Soft Tissue Neck W Contrast; Future -     CT Soft Tissue Neck W Contrast; Future  Screening for diabetes mellitus -     POCT glycosylated hemoglobin (Hb A1C)  Hypokalemia -     potassium chloride (K-DUR) 10 MEQ tablet; Take 1 tablet (10 mEq total) by mouth daily.  Need for prophylactic measure -     ondansetron (ZOFRAN) 4 MG tablet; Take 1 tablet (4 mg total) by mouth every 8 (eight) hours as needed for nausea or vomiting.  Other orders -     Cancel: azithromycin (ZITHROMAX) 250  MG tablet; Take two tabs daily for three days.    The patient is advised to call or return to clinic if she does not see an improvement in symptoms, or to seek the care of the closest emergency department if she worsens with the above plan.   Philis Fendt, MHS, PA-C [facility] 03/27/2017 5:42 PM

## 2017-03-27 NOTE — Telephone Encounter (Signed)
Called pt and advised that pt Jaime Young is aware of allergy and prescriped antiemetic with this. Pt husband stated she is going to try and go without it for now.

## 2017-03-27 NOTE — Telephone Encounter (Signed)
PATIENT'S HUSBAND (STEVEN) STATES HIS WIFE SAW MICHAEL THIS MORNING AND HE PRESCRIBED HER TO HAVE HYDROCODONE. HE SAID SHE IS ALLERGIC TO THIS BECAUSE IT CAUSES HER TO HAVE NAUSEA AND VOMITING. HE WOULD LIKE TO HAVE SOMETHING ELSE SENT IN FOR HER. BEST PHONE [phone] (MR. [facility]'S CELL) PLEASE CALL HIM WHEN IT HAS BEEN DONE SO HE CAN PICK IT UP.  PHARMACY CHOICE IS [facility] ON [address]. [place]

## 2017-03-28 ENCOUNTER — Other Ambulatory Visit: Payer: Self-pay | Admitting: Physician Assistant

## 2017-03-28 MED ORDER — PREDNISONE 20 MG PO TABS: ORAL_TABLET | ORAL | 0 refills | Status: AC

## 2017-04-24 DIAGNOSIS — J385 Laryngeal spasm: Secondary | ICD-10-CM | POA: Diagnosis not present

## 2017-04-24 DIAGNOSIS — K219 Gastro-esophageal reflux disease without esophagitis: Secondary | ICD-10-CM | POA: Diagnosis not present

## 2017-07-21 DIAGNOSIS — H2511 Age-related nuclear cataract, right eye: Secondary | ICD-10-CM | POA: Diagnosis not present

## 2017-07-21 DIAGNOSIS — H34832 Tributary (branch) retinal vein occlusion, left eye, with macular edema: Secondary | ICD-10-CM | POA: Diagnosis not present

## 2017-07-21 DIAGNOSIS — H35373 Puckering of macula, bilateral: Secondary | ICD-10-CM | POA: Diagnosis not present

## 2017-07-21 DIAGNOSIS — Z961 Presence of intraocular lens: Secondary | ICD-10-CM | POA: Diagnosis not present

## 2017-07-27 DIAGNOSIS — K219 Gastro-esophageal reflux disease without esophagitis: Secondary | ICD-10-CM | POA: Diagnosis not present

## 2017-07-27 DIAGNOSIS — J385 Laryngeal spasm: Secondary | ICD-10-CM | POA: Diagnosis not present

## 2017-07-31 NOTE — Progress Notes (Signed)
This encounter was created in error - please disregard.

## 2017-08-27 ENCOUNTER — Ambulatory Visit (INDEPENDENT_AMBULATORY_CARE_PROVIDER_SITE_OTHER): Payer: Medicare Other | Admitting: *Deleted

## 2017-08-27 ENCOUNTER — Ambulatory Visit: Payer: Medicare Other | Admitting: Internal Medicine

## 2017-08-27 VITALS — BP 128/62 | HR 68 | Resp 18 | Ht 62.0 in | Wt 136.0 lb

## 2017-08-27 DIAGNOSIS — Z0000 Encounter for general adult medical examination without abnormal findings: Secondary | ICD-10-CM | POA: Diagnosis not present

## 2017-08-27 DIAGNOSIS — Z23 Encounter for immunization: Secondary | ICD-10-CM | POA: Diagnosis not present

## 2017-08-27 MED ORDER — ZOSTER VAC RECOMB ADJUVANTED 50 MCG/0.5ML IM SUSR: 0.5000 mL | Freq: Once | INTRAMUSCULAR | 1 refills | Status: AC

## 2017-08-27 NOTE — Progress Notes (Addendum)
Subjective:   Jaime Young is a 67 y.o. female who presents for an Initial Medicare Annual Wellness Visit.  Review of Systems    No ROS.  Medicare Wellness Visit. Additional risk factors are reflected in the social history.   Cardiac Risk Factors include: advanced age (>95men, >77 women);dyslipidemia  Sleep patterns: feels rested on waking, gets up 1 times nightly to void and sleeps 7-8 hours nightly.   Home Safety/Smoke Alarms: Feels safe in home. Smoke alarms in place.  Living environment; residence and Firearm Safety: 1-story house/ trailer, no firearms Lives with husband, no needs for DME, good support system. Seat Belt Safety/Bike Helmet: Wears seat belt.      Objective:    Today's Vitals   08/27/17 0830  BP: 128/62  Pulse: 68  Resp: 18  SpO2: 100%  Weight: 136 lb (61.7 kg)  Height: 5\' 2"  (1.575 m)   Body mass index is 24.87 kg/m.  Advanced Directives 08/27/2017 03/23/2017 08/20/2016 09/25/2015 08/02/2015 02/01/2014 01/16/2014  Does Patient Have a Medical Advance Directive? No No No No No No Patient does not have advance directive  Does patient want to make changes to medical advance directive? Yes (ED - Information included in AVS) - - - - - -  Would patient like information on creating a medical advance directive? - - - - No - patient declined information - -    Current Medications (verified) Outpatient Encounter Medications as of 08/27/2017  Medication Sig  . albuterol (PROVENTIL HFA;VENTOLIN HFA) 108 (90 Base) MCG/ACT inhaler Inhale 1-2 puffs into the lungs every 6 (six) hours as needed.  . brimonidine-timolol (COMBIGAN) 0.2-0.5 % ophthalmic solution Place 1 drop into both eyes at bedtime.   . Calcium-Magnesium-Vitamin D (CALCIUM 500) 500-250-200 MG-MG-UNIT TABS Take 3 tablets by mouth daily.  . cyanocobalamin 500 MCG tablet Take 500 mcg by mouth daily.  . Multiple Vitamins tablet Take 1 tablet by mouth daily. Reported on 08/02/2015  . Omega-3 Fatty Acids (FISH OIL)  300 MG CAPS Take 1 capsule by mouth daily.  . ranitidine (ZANTAC) 150 MG tablet Take 1 tablet (150 mg total) by mouth 2 (two) times daily.  . tamoxifen (NOLVADEX) 20 MG tablet Take 1 tablet (20 mg total) by mouth daily.  . vitamin C (ASCORBIC ACID) 500 MG tablet Take 1,000 mg by mouth daily.  . vitamin E (VITAMIN E) 400 UNIT capsule Take 400 Units by mouth daily.  Marland Kitchen Zoster Vaccine Adjuvanted [facility]) injection Inject 0.5 mLs into the muscle once for 1 dose.  . [DISCONTINUED] azithromycin (ZITHROMAX) 200 MG/5ML suspension Take 12.5 mLs (500 mg total) by mouth daily. (Patient not taking: Reported on 08/27/2017)  . [DISCONTINUED] benzonatate (TESSALON) 100 MG capsule Take 1 capsule (100 mg total) by mouth 3 (three) times daily as needed for cough. (Patient not taking: Reported on 08/27/2017)  . [DISCONTINUED] cetirizine (ZYRTEC) 10 MG tablet Take 1 tablet (10 mg total) by mouth at bedtime. (Patient not taking: Reported on 08/27/2017)  . [DISCONTINUED] ondansetron (ZOFRAN) 4 MG tablet Take 1 tablet (4 mg total) by mouth every 8 (eight) hours as needed for nausea or vomiting. (Patient not taking: Reported on 08/27/2017)  . [DISCONTINUED] potassium chloride (K-DUR) 10 MEQ tablet Take 1 tablet (10 mEq total) by mouth daily. (Patient not taking: Reported on 08/27/2017)   No facility-administered encounter medications on file as of 08/27/2017.     Allergies (verified) Dextromethorphan; Ciprofloxacin; Codeine; Hydrocodone; Sulfonamide derivatives; and Aflibercept   History: Past Medical History:  Diagnosis Date  .  Allergic rhinitis   . Anxiety   . Cancer ([place])    stage I right breast cancer  . Diverticulitis   . Diverticulosis of colon   . DJD (degenerative joint disease) of lumbar spine   . DVT (deep venous thrombosis) ([place]) 1977  . 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 Hyperlipidemia   . Impaired glucose tolerance 11/17/2010  . Migraine   . Osteopenia 09/18/2010  . Post-operative nausea  and vomiting   . RADIAL NERVE INJURY   . Raynaud's disease   . RAYNAUD'S DISEASE   . SUPERFICIAL PHLEBITIS 09/01/2007   Past Surgical History:  Procedure Laterality Date  . BACK SURGERY  2002  . BREAST LUMPECTOMY Right 2011   axillary node dissection  . BREAST SURGERY  1989/2001   implants  . [place]  . PARS PLANA VITRECTOMY Left 2013   w/Membranectomy  . TONSILLECTOMY    . TOTAL ABDOMINAL HYSTERECTOMY W/ BILATERAL SALPINGOOPHORECTOMY  1983   secondary to edometriosis   Family History  Problem Relation Age of Onset  . Stroke Mother   . Hypertension Mother   . Heart disease Father   . Cancer Father   . Esophageal cancer Father   . Cancer Sister   . Stomach cancer Sister   . Breast cancer Other   . Bone cancer Other   . Stomach cancer Other   . Colon cancer Neg Hx   . Rectal cancer Neg Hx    Social History   Socioeconomic History  . Marital status: Married    Spouse name: None  . Number of children: 1  . Years of education: None  . Highest education level: None  Social Needs  . Financial resource strain: Not hard at all  . Food insecurity - worry: Never true  . Food insecurity - inability: Never true  . Transportation needs - medical: No  . Transportation needs - non-medical: No  Occupational History  . Occupation: Audiological scientist: [employer]  . Occupation: Herbalist  Tobacco Use  . Smoking status: Former Research scientist (life sciences)  . Smokeless tobacco: Never Used  Substance and Sexual Activity  . Alcohol use: No  . Drug use: No  . Sexual activity: Yes  Other Topics Concern  . None  Social History Narrative  . None    Tobacco Counseling Counseling given: Not Answered     Activities of Daily Living In your present state of health, do you have any difficulty performing the following activities: 08/27/2017  Hearing? N  Vision? N  Difficulty concentrating or making decisions? N  Walking or climbing stairs? N  Dressing or bathing? N    Doing errands, shopping? N  Preparing Food and eating ? N  Using the Toilet? N  In the past six months, have you accidently leaked urine? N  Do you have problems with loss of bowel control? N  Managing your Medications? N  Managing your Finances? N  Housekeeping or managing your Housekeeping? N  Some recent data might be hidden     Immunizations and Health Maintenance Immunization History  Administered Date(s) Administered  . Influenza, High Dose Seasonal PF 04/18/2016  . Pneumococcal Conjugate-13 08/25/2016  . Pneumococcal Polysaccharide-23 08/27/2017  . Zoster 07/12/2012   Health Maintenance Due  Topic Date Due  . MAMMOGRAM  08/20/2017    Patient Care Team: Biagio Borg, MD as PCP - General Isaiah Blakes [name], MD (Radiology) Magrinat, [name], MD as  Consulting Physician (Oncology)  Indicate any recent Medical Services you may have received from other than [facility] providers in the past year (date may be approximate).     Assessment:   This is a routine wellness examination for Jaime Young. nocpe   Hearing/Vision screen Hearing Screening Comments: HOH, has hearing aids Vision Screening Comments: appointment yearly Dr.Shah  Dietary issues and exercise activities discussed: Current Exercise Habits: Home exercise routine, Type of exercise: treadmill;strength training/weights;calisthenics, Time (Minutes): 50, Frequency (Times/Week): 5, Weekly Exercise (Minutes/Week): 250, Intensity: Mild, Exercise limited by: None identified  Diet (meal preparation, eat out, water intake, caffeinated beverages, dairy products, fruits and vegetables): in general, a "healthy" diet  , well balanced, eats a variety of fruits and vegetables daily, limits salt, fat/cholesterol, sugar,carbohydrates,caffeine, drinks 6-8 glasses of water daily.  Goals    . 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 Continue exercise, eat healthy, enjoy life, family and  worship God.    . Weight (lb) < 200 lb (90.7 kg)      Depression Screen PHQ 2/9 Scores 08/27/2017 03/27/2017 08/20/2016 08/02/2015 11/28/2014  PHQ - 2 Score 0 0 0 0 0  PHQ- 9 Score 0 - - - -    Fall Risk Fall Risk  08/27/2017 03/27/2017 08/20/2016 11/28/2014  Falls in the past year? No No No No     Cognitive Function:       Ad8 score reviewed for issues:  Issues making decisions: no  Less interest in hobbies / activities: no  Repeats questions, stories (family complaining): no  Trouble using ordinary gadgets (microwave, computer, phone):no  Forgets the month or year: no  Mismanaging finances: no  Remembering appts: no  Daily problems with thinking and/or memory: no Ad8 score is= 0 Screening Tests Health Maintenance  Topic Date Due  . MAMMOGRAM  08/20/2017  . INFLUENZA VACCINE  09/14/2017 (Originally 01/14/2017)  . COLONOSCOPY  02/02/2019  . TETANUS/TDAP  06/16/2024  . DEXA SCAN  Completed  . Hepatitis C Screening  Completed  . PNA vac Low Risk Adult  Completed     Plan:     Patient has a mammogram scheduled with Dr. Isaiah Blakes, she will request they send results to PCP.  An prescription order for Shingrix vaccine was placed per patient's request.  Continue doing brain stimulating activities (puzzles, reading, adult coloring books, staying active) to keep memory sharp.   Continue to eat heart healthy diet (full of fruits, vegetables, whole grains, lean protein, water--limit salt, fat, and sugar intake) and increase physical activity as tolerated.   I have personally reviewed and noted the following in the patient's chart:   . Medical and social history . Use of alcohol, tobacco or illicit drugs  . Current medications and supplements . Functional ability and status . Nutritional status . Physical activity . Advanced directives . List of other physicians . Vitals . Screenings to include cognitive, depression, and falls . Referrals and appointments  In  addition, I have reviewed and discussed with patient certain preventive protocols, quality metrics, and best practice recommendations. A written personalized care plan for preventive services as well as general preventive health recommendations were provided to patient.     Michiel Cowboy, RN   08/27/2017    Medical screening examination/treatment/procedure(s) were performed by non-physician practitioner and as supervising physician I was immediately available for consultation/collaboration. I agree with above. Binnie Rail, MD

## 2017-08-27 NOTE — Patient Instructions (Signed)
Continue doing brain stimulating activities (puzzles, reading, adult coloring books, staying active) to keep memory sharp.   Continue to eat heart healthy diet (full of fruits, vegetables, whole grains, lean protein, water--limit salt, fat, and sugar intake) and increase physical activity as tolerated.   Jaime Young , Thank you for taking time to come for your Medicare Wellness Visit. I appreciate your ongoing commitment to your health goals. Please review the following plan we discussed and let me know if I can assist you in the future.   These are the goals we discussed: Goals    . 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 Continue exercise, eat healthy, enjoy life, family and worship God.    . Weight (lb) < 200 lb (90.7 kg)       This is a list of the screening recommended for you and due dates:  Health Maintenance  Topic Date Due  . Mammogram  08/20/2017  . Pneumonia vaccines (2 of 2 - PPSV23) 08/25/2017  . Flu Shot  09/14/2017*  . Colon Cancer Screening  02/02/2019  . Tetanus Vaccine  06/16/2024  . DEXA scan (bone density measurement)  Completed  .  Hepatitis C: One time screening is recommended by Center for Disease Control  (CDC) for  adults born from [birth year] through 1966.   Completed  *Topic was postponed. The date shown is not the original due date.

## 2017-09-02 ENCOUNTER — Ambulatory Visit (INDEPENDENT_AMBULATORY_CARE_PROVIDER_SITE_OTHER): Payer: Medicare Other | Admitting: Internal Medicine

## 2017-09-02 ENCOUNTER — Other Ambulatory Visit: Payer: Self-pay | Admitting: Internal Medicine

## 2017-09-02 ENCOUNTER — Other Ambulatory Visit (INDEPENDENT_AMBULATORY_CARE_PROVIDER_SITE_OTHER): Payer: Medicare Other

## 2017-09-02 ENCOUNTER — Encounter: Payer: Self-pay | Admitting: Internal Medicine

## 2017-09-02 VITALS — BP 114/78 | HR 69 | Temp 98.1°F | Ht 62.0 in | Wt 135.0 lb

## 2017-09-02 DIAGNOSIS — Z0000 Encounter for general adult medical examination without abnormal findings: Secondary | ICD-10-CM

## 2017-09-02 DIAGNOSIS — E785 Hyperlipidemia, unspecified: Secondary | ICD-10-CM

## 2017-09-02 DIAGNOSIS — F411 Generalized anxiety disorder: Secondary | ICD-10-CM

## 2017-09-02 DIAGNOSIS — R7302 Impaired glucose tolerance (oral): Secondary | ICD-10-CM | POA: Diagnosis not present

## 2017-09-02 DIAGNOSIS — E876 Hypokalemia: Secondary | ICD-10-CM

## 2017-09-02 LAB — BASIC METABOLIC PANEL
BUN: 15 mg/dL (ref 6–23)
CALCIUM: 9.8 mg/dL (ref 8.4–10.5)
CO2: 27 mEq/L (ref 19–32)
CREATININE: 0.84 mg/dL (ref 0.40–1.20)
Chloride: 105 mEq/L (ref 96–112)
GFR: 71.72 mL/min (ref >60.00)
Glucose, Bld: 97 mg/dL (ref 70–99)
Potassium: 4.2 mEq/L (ref 3.5–5.1)
Sodium: 145 mEq/L (ref 135–145)

## 2017-09-02 LAB — URINALYSIS, ROUTINE W REFLEX MICROSCOPIC
Bilirubin Urine: NEGATIVE
KETONES UR: NEGATIVE
LEUKOCYTES UA: NEGATIVE
Nitrite: NEGATIVE
PH: 5.5 (ref 5.0–8.0)
Specific Gravity, Urine: <=1.005 — AB (ref 1.000–1.030)
Total Protein, Urine: NEGATIVE
URINE GLUCOSE: NEGATIVE
UROBILINOGEN UA: 0.2 (ref 0.0–1.0)

## 2017-09-02 LAB — CBC WITH DIFFERENTIAL/PLATELET
BASOS PCT: 0.8 % (ref 0.0–3.0)
Basophils Absolute: 0 10*3/uL (ref 0.0–0.1)
EOS PCT: 3 % (ref 0.0–5.0)
Eosinophils Absolute: 0.1 10*3/uL (ref 0.0–0.7)
HEMATOCRIT: 40.1 % (ref 36.0–46.0)
Hemoglobin: 13.6 g/dL (ref 12.0–15.0)
LYMPHS ABS: 1.1 10*3/uL (ref 0.7–4.0)
Lymphocytes Relative: 31.2 % (ref 12.0–46.0)
MCHC: 34 g/dL (ref 30.0–36.0)
MCV: 92.1 fl (ref 78.0–100.0)
MONOS PCT: 9.4 % (ref 3.0–12.0)
Monocytes Absolute: 0.3 10*3/uL (ref 0.1–1.0)
NEUTROS PCT: 55.6 % (ref 43.0–77.0)
Neutro Abs: 2 10*3/uL (ref 1.4–7.7)
Platelets: 214 10*3/uL (ref 150.0–400.0)
RBC: 4.35 Mil/uL (ref 3.87–5.11)
RDW: 12.9 % (ref 11.5–15.5)
WBC: 3.7 10*3/uL — ABNORMAL LOW (ref 4.0–10.5)

## 2017-09-02 LAB — LIPID PANEL
CHOL/HDL RATIO: 4
Cholesterol: 221 mg/dL — ABNORMAL HIGH (ref 0–200)
HDL: 50.2 mg/dL (ref >39.00)
LDL Cholesterol: 144 mg/dL — ABNORMAL HIGH (ref 0–99)
NonHDL: 170.72
TRIGLYCERIDES: 134 mg/dL (ref 0.0–149.0)
VLDL: 26.8 mg/dL (ref 0.0–40.0)

## 2017-09-02 LAB — HEPATIC FUNCTION PANEL
ALK PHOS: 46 U/L (ref 39–117)
ALT: 29 U/L (ref 0–35)
AST: 33 U/L (ref 0–37)
Albumin: 4.5 g/dL (ref 3.5–5.2)
BILIRUBIN DIRECT: 0.1 mg/dL (ref 0.0–0.3)
BILIRUBIN TOTAL: 0.2 mg/dL (ref 0.2–1.2)
Total Protein: 6.9 g/dL (ref 6.0–8.3)

## 2017-09-02 LAB — TSH: TSH: 3.1 u[IU]/mL (ref 0.35–4.50)

## 2017-09-02 MED ORDER — ROSUVASTATIN CALCIUM 20 MG PO TABS
20.0000 mg | ORAL_TABLET | Freq: Every day | ORAL | 3 refills | Status: DC
Start: 2017-09-02 — End: 2018-09-07

## 2017-09-02 NOTE — Assessment & Plan Note (Signed)
stable overall by history and exam, recent data reviewed with pt, and pt to continue medical treatment as before,  to f/u any worsening symptoms or concerns, for f/u lab 

## 2017-09-02 NOTE — Patient Instructions (Signed)
OK to stop the Vitamin E  Please continue all other medications as before, and refills have been done if requested.  Please have the pharmacy call with any other refills you may need.  Please continue your efforts at being more active, low cholesterol diet, and weight control.  You are otherwise up to date with prevention measures today.  Please keep your appointments with your specialists as you may have planned  Please go to the LAB in the Basement (turn left off the elevator) for the tests to be done today  You will be contacted by phone if any changes need to be made immediately.  Otherwise, you will receive a letter about your results with an explanation, but please check with MyChart first.  Please remember to sign up for MyChart if you have not done so, as this will be important to you in the future with finding out test results, communicating by private email, and scheduling acute appointments online when needed.  Please return in 1 year for your yearly visit, or sooner if needed

## 2017-09-02 NOTE — Assessment & Plan Note (Signed)
stable overall by history and exam, recent data reviewed with pt, and pt to continue medical treatment as before,  to f/u any worsening symptoms or concerns, for f/u lab today, consider statin

## 2017-09-02 NOTE — Assessment & Plan Note (Signed)
Mild, stable overall by history and exam, and pt to continue medical treatment as before,  to f/u any worsening symptoms or concerns, cont same tx

## 2017-09-02 NOTE — Addendum Note (Signed)
Addended by: Juliet Rude on: 09/02/2017 08:49 AM   Modules accepted: Orders

## 2017-09-02 NOTE — Progress Notes (Signed)
Subjective:    Patient ID: Jaime Young, female    DOB: [birth year], 67 y.o.   MRN: [id number]  HPI    Here for yearly f/u;  Overall doing ok;  Pt denies Chest pain, worsening SOB, DOE, wheezing, orthopnea, PND, worsening LE edema, palpitations, dizziness or syncope.  Pt denies neurological change such as new headache, facial or extremity weakness.  Pt denies polydipsia, polyuria, or low sugar symptoms. Pt states overall good compliance with treatment and medications, good tolerability, and has been trying to follow appropriate diet.  Pt denies worsening depressive symptoms, suicidal ideation or panic. No fever, night sweats, wt loss, loss of appetite, or other constitutional symptoms.  Pt states good ability with ADL's, has low fall risk, home safety reviewed and adequate, no other significant changes in hearing or vision, and occasionally active with exercise, so just cant seem to lose wt, plans now for mod atkins type diet and will try to lose. Husband already lost 5 lbs Wt Readings from Last 3 Encounters:  09/02/17 135 lb (61.2 kg)  08/27/17 136 lb (61.7 kg)  03/27/17 130 lb (59 kg)  Did see Dr Redmond Baseman ENT with dx laryngospasm which has lasted several times up to 30 sec and cannot breath, but now instructed to "sniff sniff then hiss his" which usually helps.  Also on zantac for this for at least 6 mo trial, has been doing ok so far.  [facility] apr 11 for f/u, also due for mammogram.    Past Medical History:  Diagnosis Date  . Allergic rhinitis   . Anxiety   . Cancer ([place])    stage I right breast cancer  . Diverticulitis   . Diverticulosis of colon   . DJD (degenerative joint disease) of lumbar spine   . DVT (deep venous thrombosis) ([place]) 1977  . 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 Hyperlipidemia   . Impaired glucose tolerance 11/17/2010  . Migraine   . Osteopenia 09/18/2010  . Post-operative nausea and vomiting   . RADIAL NERVE INJURY   . Raynaud's disease   . RAYNAUD'S  DISEASE   . SUPERFICIAL PHLEBITIS 09/01/2007   Past Surgical History:  Procedure Laterality Date  . BACK SURGERY  2002  . BREAST LUMPECTOMY Right 2011   axillary node dissection  . BREAST SURGERY  1989/2001   implants  . [place]  . PARS PLANA VITRECTOMY Left 2013   w/Membranectomy  . TONSILLECTOMY    . TOTAL ABDOMINAL HYSTERECTOMY W/ BILATERAL SALPINGOOPHORECTOMY  1983   secondary to edometriosis    reports that she has quit smoking. she has never used smokeless tobacco. She reports that she does not drink alcohol or use drugs. family history includes Bone cancer in her other; Breast cancer in her other; Cancer in her father and sister; Esophageal cancer in her father; Heart disease in her father; Hypertension in her mother; Stomach cancer in her other and sister; Stroke in her mother. Allergies  Allergen Reactions  . Dextromethorphan Swelling  . Ciprofloxacin Other (See Comments)    Pt denies  . Codeine Nausea Only  . Hydrocodone Nausea And Vomiting    Pt indicates N/V occurs with all narcotics.   . Sulfonamide Derivatives Nausea Only  . Aflibercept Rash   Current Outpatient Medications on File Prior to Visit  Medication Sig Dispense Refill  . brimonidine-timolol (COMBIGAN) 0.2-0.5 % ophthalmic solution Place 1 drop into the left eye 2 (two) times daily.     Marland Kitchen  Calcium-Magnesium-Vitamin D (CALCIUM 500) 500-250-200 MG-MG-UNIT TABS Take 3 tablets by mouth daily.    . cyanocobalamin 500 MCG tablet Take 500 mcg by mouth daily.    . Multiple Vitamins tablet Take 1 tablet by mouth daily. Reported on 08/02/2015    . Omega-3 Fatty Acids (FISH OIL) 300 MG CAPS Take 1 capsule by mouth daily.    . ranitidine (ZANTAC) 150 MG tablet Take 1 tablet (150 mg total) by mouth 2 (two) times daily. 60 tablet 0  . tamoxifen (NOLVADEX) 20 MG tablet Take 1 tablet (20 mg total) by mouth daily. 90 tablet 2  . vitamin C (ASCORBIC ACID) 500 MG tablet Take 1,000 mg by mouth daily.    .  vitamin E (VITAMIN E) 400 UNIT capsule Take 400 Units by mouth daily.     No current facility-administered medications on file prior to visit.    Review of Systems Constitutional: Negative for other unusual diaphoresis, sweats, appetite or weight changes HENT: Negative for other worsening hearing loss, ear pain, facial swelling, mouth sores or neck stiffness.   Eyes: Negative for other worsening pain, redness or other visual disturbance.  Respiratory: Negative for other stridor or swelling Cardiovascular: Negative for other palpitations or other chest pain  Gastrointestinal: Negative for worsening diarrhea or loose stools, blood in stool, distention or other pain Genitourinary: Negative for hematuria, flank pain or other change in urine volume.  Musculoskeletal: Negative for myalgias or other joint swelling.  Skin: Negative for other color change, or other wound or worsening drainage.  Neurological: Negative for other syncope or numbness. Hematological: Negative for other adenopathy or swelling Psychiatric/Behavioral: Negative for hallucinations, other worsening agitation, SI, self-injury, or new decreased concentration All other system neg per pt    Objective:   Physical Exam BP 114/78   Pulse 69   Temp 98.1 F (36.7 C) (Oral)   Ht 5\' 2"  (1.575 m)   Wt 135 lb (61.2 kg)   SpO2 95%   BMI 24.69 kg/m  VS noted, not ill appearing Constitutional: Pt is oriented to person, place, and time. Appears well-developed and well-nourished, in no significant distress and comfortable Head: Normocephalic and atraumatic  Eyes: Conjunctivae and EOM are normal. Pupils are equal, round, and reactive to light Right Ear: External ear normal without discharge Left Ear: External ear normal without discharge Nose: Nose without discharge or deformity Mouth/Throat: Oropharynx is without other ulcerations and moist  Neck: Normal range of motion. Neck supple. No JVD present. No tracheal deviation present or  significant neck LA or mass Cardiovascular: Normal rate, regular rhythm, normal heart sounds and intact distal pulses. Pulmonary/Chest: WOB normal and breath sounds without rales or wheezing  Abdominal: Soft. Bowel sounds are normal. NT. No HSM  Musculoskeletal: Normal range of motion. Exhibits chronic bilat trace leg edema right > left Lymphadenopathy: Has no other cervical adenopathy.  Neurological: Pt is alert and oriented to person, place, and time. Pt has normal reflexes. No cranial nerve deficit. Motor grossly intact, Gait intact Skin: Skin is warm and dry. No rash noted or new ulcerations Psychiatric:  Has normal mood and affect. Behavior is normal without agitation.mild nervous only No other exam findings  Lab Results  Component Value Date   WBC 11.4 (H) 03/23/2017   HGB 13.2 03/23/2017   HCT 39.0 03/23/2017   PLT 189 03/23/2017   GLUCOSE 102 (H) 03/23/2017   CHOL 211 (H) 08/20/2016   TRIG 121.0 08/20/2016   HDL 53.80 08/20/2016   LDLDIRECT 160.6 12/20/2007  LDLCALC 133 (H) 08/20/2016   ALT 17 09/16/2016   AST 21 09/16/2016   NA 142 03/23/2017   K 3.2 (L) 03/27/2017   CL 105 03/23/2017   CREATININE 0.79 03/23/2017   BUN 9 03/23/2017   CO2 23 03/23/2017   TSH 2.40 08/20/2016   INR 1.00 02/04/2010   HGBA1C 4.9 03/27/2017       Assessment & Plan:

## 2017-09-02 NOTE — Assessment & Plan Note (Signed)
Has been recurring mild low for unclear reason, for f/u lab today

## 2017-09-03 ENCOUNTER — Telehealth: Payer: Self-pay

## 2017-09-03 ENCOUNTER — Telehealth: Payer: Self-pay | Admitting: *Deleted

## 2017-09-03 NOTE — Telephone Encounter (Signed)
-----   Message from Biagio Borg, MD sent at 09/02/2017  5:51 PM EDT ----- Left message on MyChart, pt to cont same tx except  The test results show that your current treatment is OK, except the LDL cholesterol is moderately elevated.  Please start a statin called crestor 20 mg per day, to help reduce your future heart and stroke disease risk.Redmond Baseman to please inform pt, I will do rx

## 2017-09-03 NOTE — Telephone Encounter (Signed)
Called in response to patient leaving a VM with nurse stating she does not want to begin taking rosuvastatin. Nurse discussed patient's feelings with PCP and explained to Jaime Young that Dr. [name] stated that her LDL cholesterol is in a high risk level and the doctor would like for patient to start the medication. Jaime Young explained that she wants to try to control her cholesterol with changing her diet and exercise, she has watched her husband experience severe muscle aches and cramps due to statins and adds he tried to take 3-4 different types all having the same side effect. This is why she is extremely apprehensive about taking crestor. Nurse educated patient that not all individuals have adverse side effects and encouraged patient to try the medication to see how well she tolerates taking crestor as her cholesterol levels are in the high risk level for cardiovascular complications. Patient verbalized understanding about her risk and explained that at this time she does not want to take the medication. She will attempt to control her cholesterol with a lifestyle changes, diet, and exercise. Nurse stated she will send the Patient education via email regarding low cholesterol diet and healthy lifestyle changes.

## 2017-09-03 NOTE — Telephone Encounter (Signed)
Pt viewed results via MyChart

## 2017-09-08 ENCOUNTER — Encounter: Payer: Self-pay | Admitting: Oncology

## 2017-09-08 DIAGNOSIS — Z853 Personal history of malignant neoplasm of breast: Secondary | ICD-10-CM | POA: Diagnosis not present

## 2017-09-08 DIAGNOSIS — Z803 Family history of malignant neoplasm of breast: Secondary | ICD-10-CM | POA: Diagnosis not present

## 2017-09-08 DIAGNOSIS — R928 Other abnormal and inconclusive findings on diagnostic imaging of breast: Secondary | ICD-10-CM | POA: Diagnosis not present

## 2017-09-23 NOTE — Progress Notes (Signed)
[facility]  [phone] [phone]     ID: LILIAUNA SANTONI   DOB: May 29, 1951  MR#: [id number]  TIR#:[id number]   PCP:  Biagio Borg, MD GYN:  Lesia Hausen, FNP SUR:  Lucretia Field, MD OTHER:    CHIEF COMPLAINT:  Hx of Right Breast Cancer  CURRENT TREATMENT: tamoxifen   BREAST CANCER HISTORY:  from the original intake note:   She herself palpated a mass in her right breast, and brought it to Dr. [name] attention.  He set her up for a screening diagnostic mammography on July 09, 2009.  Dr. [name] performed the procedure, found no definite mammographic abnormality corresponding to the palpable complaint.  The ultrasound of the right breast did show a mixed echogenic mass at approximately 12 o'clock, extending to the implant capsule, and measuring 2.5 cm.  There was at least one abnormal lymph node in the right axilla, measuring 2.2 cm.    With this information, the patient was recalled for biopsy performed January 31,2011, and this showed ([id number]) an invasive ductal carcinoma with micropapillary features with the lymph node biopsy positive for a very similar looking tumor.  Grade of this tumor is not estimated.  The prognostic panel showed it to be estrogen receptor positive at 56%, progesterone receptor positive at 25%, with a borderline MIB-1 at 19%.  There was no amplification of HER2 by CISH with a ratio of 1.50.    With this information, the patient met with Dr. [name], and bilateral breast MRIs were obtained July 18, 2009.  There was an enhancing 2.8 cm mass corresponding to the previously biopsied malignancy, and an enlarged right axillary lymph node measuring 2.1 cm.  The left breast was unremarkable.  Dr. [name] discussed these findings with the patient, and suggested neoadjuvant treatment.  Her only option would be a modified radical mastectomy.  He felt it was most reasonable to start with chemotherapy with a view to possibly  making lumpectomy and radiation therapy possible.    Her subsequent history is as detailed below.  INTERVAL HISTORY: Shaneisha returns today for her estrogen receptor positive breast cancer.   She continues on tamoxifen, which she tolerates well. She does endorse some fatigue, but she denies hot flashes.   Since her visit here she had a mammogram on 09/08/2017 at [facility].  This showed the breast density to be category B.  There was an area of asymmetry in the left breast central to the nipple.  Accordingly ultrasound was obtained on the same day.  This was found to be a benign postsurgical scar.  Routine mammography in one year was recommended.  REVIEW OF SYSTEMS: Soriah reports a lot of "pulling" on her right side status post her surgery. She adds it feels as if a stitch is being pulled out. She continues to do her stretching. She also has been staying very active with exercising. She mowed her lawn yesterday and will walk on the treadmill daily. She denies unusual headaches, visual changes, nausea, vomiting, or dizziness. There has been no unusual cough, phlegm production, or pleurisy. This been no change in bowel or bladder habits. She denies unexplained fatigue or unexplained weight loss, bleeding, rash, or fever. A detailed review of systems was otherwise noncontributory.    PAST MEDICAL HISTORY: Past Medical History:  Diagnosis Date  . Allergic rhinitis   . Anxiety   . Cancer ([place])    stage I right breast cancer  . Diverticulitis   . Diverticulosis of  colon   . DJD (degenerative joint disease) of lumbar spine   . DVT (deep venous thrombosis) ([place]) 1977  . 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 Hyperlipidemia   . Impaired glucose tolerance 11/17/2010  . Migraine   . Osteopenia 09/18/2010  . Post-operative nausea and vomiting   . RADIAL NERVE INJURY   . Raynaud's disease   . RAYNAUD'S DISEASE   . SUPERFICIAL PHLEBITIS 09/01/2007  The past medical history is significant for  endometriosis.  The patient being status post TAH-BSO in 1983.  She underwent a hemorrhoidectomy remotely under [employer].  Of course she has breast implants in place.  These were removed and replaced in 2001 because of contractures.  She is status post tonsillectomy and adenoidectomy.    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 [name].  She has also had left eye cataract surgery.  She has a history of migraines, a history of mild hypercholesterolemia, and a history of osteopenia.   PAST SURGICAL HISTORY: Past Surgical History:  Procedure Laterality Date  . BACK SURGERY  2002  . BREAST LUMPECTOMY Right 2011   axillary node dissection  . BREAST SURGERY  1989/2001   implants  . [place]  . PARS PLANA VITRECTOMY Left 2013   w/Membranectomy  . TONSILLECTOMY    . TOTAL ABDOMINAL HYSTERECTOMY W/ BILATERAL SALPINGOOPHORECTOMY  1983   secondary to edometriosis    FAMILY HISTORY Family History  Problem Relation Age of Onset  . Stroke Mother   . Hypertension Mother   . Heart disease Father   . Cancer Father   . Esophageal cancer Father   . Cancer Sister   . Stomach cancer Sister   . Breast cancer Other   . Bone cancer Other   . Stomach cancer Other   . Colon cancer Neg Hx   . Rectal cancer Neg Hx   The patient's  father died at the age of 83 from what sounds like a gastroesophageal junction tumor.  The patient's  mother died at the age of 57 after multiple strokes.  The patient has five brothers and three sisters, all of her siblings are half-siblings.  The problems are all on her father's side.  The sister, [name], had breast and ovarian cancer.  She is deceased, and was never tested for BRCA1 and 2.  Second sister had throat cancer.  There is no other history of cancer in the immediate family to her knowledge.    GYNECOLOGIC  HISTORY:  (Updated March 2015) She is GXP1.  First pregnancy to term age 11.  Status post TAH/BSO in 1983 secondary to endometriosis. She took estrogen replacement between 1983 and 2010.  Went off the [place] patch at the time of diagnosis in 2011.    SOCIAL HISTORY:  (updated March 2015)  She worked for the [employer] here in town but has retired.  Her husband, [name], works for [employer] in the Photographer.  Daughter, [name], is a stay-at-home mom. Aprill has 3 grandchildren.  The patient attends a [employer].     ADVANCED DIRECTIVES: Not in place  HEALTH MAINTENANCE:   (updated 08/29/2013)  Social History   Tobacco Use  . Smoking status: Former Research scientist (life sciences)  . Smokeless tobacco: Never Used  Substance Use Topics  . Alcohol use: No  . Drug use: No  Colonoscopy:  2004  PAP: Feb 2014/Dr. Lomax  Bone density:  09/11/2010 (Dr. Ubaldo Glassing), osteopenia  Lipid panel:  Jan 2014/Dr. [name]     Current Outpatient Medications  Medication Sig Dispense Refill  . brimonidine-timolol (COMBIGAN) 0.2-0.5 % ophthalmic solution Place 1 drop into the left eye 2 (two) times daily.     . Calcium-Magnesium-Vitamin D (CALCIUM 500) 500-250-200 MG-MG-UNIT TABS Take 3 tablets by mouth daily.    . cyanocobalamin 500 MCG tablet Take 500 mcg by mouth daily.    . Multiple Vitamins tablet Take 1 tablet by mouth daily. Reported on 08/02/2015    . Omega-3 Fatty Acids (FISH OIL) 300 MG CAPS Take 1 capsule by mouth daily.    . ranitidine (ZANTAC) 150 MG tablet Take 1 tablet (150 mg total) by mouth 2 (two) times daily. 60 tablet 0  . rosuvastatin (CRESTOR) 20 MG tablet Take 1 tablet (20 mg total) by mouth daily. 90 tablet 3  . tamoxifen (NOLVADEX) 20 MG tablet Take 1 tablet (20 mg total) by mouth daily. 90 tablet 2  . vitamin C (ASCORBIC ACID) 500 MG tablet Take 1,000 mg by mouth daily.     No current facility-administered medications for this visit.     OBJECTIVE: middle-aged white woman who  appears well  Vitals:   09/24/17 1130  BP: (!) 144/80  Pulse: 64  Resp: 18  Temp: 98.1 F (36.7 C)  SpO2: 100%  Body mass index is 24.34 kg/m.    ECOG:  1 Filed Weights   09/24/17 1130  Weight: 133 lb 1.6 oz (60.4 kg)   Sclerae unicteric, pupils round and equal No cervical or supraclavicular adenopathy Lungs no rales or rhonchi Heart regular rate and rhythm Abd soft, nontender, positive bowel sounds MSK no focal spinal tenderness, no right upper extremity lymphedema, good range of motion both upper extremities Neuro: nonfocal, well oriented, appropriate affect Breasts: The right breast is status post lumpectomy, radiation, and implant reconstruction.  The implant leans laterally and this may be partly aggravating the symptoms she is having in the right axilla.  The left breast is status post implant placement but it is otherwise unremarkable.  Both axillae are benign.   LAB RESULTS: Lab Results  Component Value Date   WBC 4.1 09/24/2017   NEUTROABS 2.1 09/24/2017   HGB 13.0 09/24/2017   HCT 39.6 09/24/2017   MCV 93.8 09/24/2017   PLT 170 09/24/2017      Chemistry      Component Value Date/Time   NA 145 09/02/2017 0850   NA 142 09/16/2016 0746   K 4.2 09/02/2017 0850   K 3.9 09/16/2016 0746   CL 105 09/02/2017 0850   CL 105 08/02/2012 1513   CO2 27 09/02/2017 0850   CO2 26 09/16/2016 0746   BUN 15 09/02/2017 0850   BUN 10.6 09/16/2016 0746   CREATININE 0.84 09/02/2017 0850   CREATININE 0.8 09/16/2016 0746      Component Value Date/Time   CALCIUM 9.8 09/02/2017 0850   CALCIUM 9.2 09/16/2016 0746   ALKPHOS 46 09/02/2017 0850   ALKPHOS 38 (L) 09/16/2016 0746   AST 33 09/02/2017 0850   AST 21 09/16/2016 0746   ALT 29 09/02/2017 0850   ALT 17 09/16/2016 0746   BILITOT 0.2 09/02/2017 0850   BILITOT 0.22 09/16/2016 0746      STUDIES: Bilateral mammography and left ultrasonography at [facility] 09/08/2017 showed no evidence of disease recurrence  ASSESSMENT: 67  y.o. [place] woman   (1)  status post right breast and axillary lymph node biopsy January 2011, both positive for a clinical T2 N1 invasive ductal carcinoma which was estrogen and progesterone receptor-positive, HER-2/neu-negative with an MIB-1 of 19%.  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  Margins were negative.   (3)  completed radiation therapy in November 2011, then started letrozole but this had to be discontinued April 2012 because of tolerance issues.   (4) on tamoxifen as of May 2012, Plan is to continue for a total of 10 years  PLAN:  Kelsye is now over 7 years out from definitive surgery for her breast cancer with no evidence of disease recurrence.  This is very favorable.  She is tolerating tamoxifen remarkably well and the plan will be to continue that a total of 10 years.  I do not think the problems she is experiencing in the right axilla area are related to her cancer except of course that she had surgery and radiation there.  We are dealing with scar tissue and some post implant changes.  She is interested in having a second opinion from plastic surgery and perhaps having the implant on the right side removed.  We discussed some alternatives regarding that  Otherwise she has changed her diet, started red yeast rice to take care of her cholesterol problems, and is exercising regularly, which is very favorable.  She will see me again in 1 year.  She knows to call for any problems that may develop before that visit.  Magrinat, [name], MD  09/24/17 11:43 AM Medical  Oncology and Hematology [facility] [place] [address] [place], [place] [address] Tel. [phone]    Fax. [phone]  This document serves as a record of services personally performed by Chauncey Cruel, MD. It was created on his behalf by Margit Banda, a trained medical scribe. The creation of this record is based on the scribe's personal observations and the provider's statements to them.   I have reviewed the above documentation for accuracy and completeness, and I agree with the above.

## 2017-09-24 ENCOUNTER — Inpatient Hospital Stay: Payer: Medicare Other | Attending: Oncology | Admitting: Oncology

## 2017-09-24 ENCOUNTER — Inpatient Hospital Stay: Payer: Medicare Other

## 2017-09-24 ENCOUNTER — Telehealth: Payer: Self-pay | Admitting: Oncology

## 2017-09-24 ENCOUNTER — Encounter: Payer: Self-pay | Admitting: Oncology

## 2017-09-24 VITALS — BP 144/80 | HR 64 | Temp 98.1°F | Resp 18 | Ht 62.0 in | Wt 133.1 lb

## 2017-09-24 DIAGNOSIS — C773 Secondary and unspecified malignant neoplasm of axilla and upper limb lymph nodes: Secondary | ICD-10-CM | POA: Diagnosis not present

## 2017-09-24 DIAGNOSIS — Z170 Estrogen receptor positive status [ER+]: Principal | ICD-10-CM

## 2017-09-24 DIAGNOSIS — Z808 Family history of malignant neoplasm of other organs or systems: Secondary | ICD-10-CM | POA: Diagnosis not present

## 2017-09-24 DIAGNOSIS — Z923 Personal history of irradiation: Secondary | ICD-10-CM

## 2017-09-24 DIAGNOSIS — Z8041 Family history of malignant neoplasm of ovary: Secondary | ICD-10-CM | POA: Diagnosis not present

## 2017-09-24 DIAGNOSIS — Z803 Family history of malignant neoplasm of breast: Secondary | ICD-10-CM | POA: Diagnosis not present

## 2017-09-24 DIAGNOSIS — Z79811 Long term (current) use of aromatase inhibitors: Secondary | ICD-10-CM

## 2017-09-24 DIAGNOSIS — Z87891 Personal history of nicotine dependence: Secondary | ICD-10-CM

## 2017-09-24 DIAGNOSIS — Z79899 Other long term (current) drug therapy: Secondary | ICD-10-CM

## 2017-09-24 DIAGNOSIS — M8580 Other specified disorders of bone density and structure, unspecified site: Secondary | ICD-10-CM

## 2017-09-24 DIAGNOSIS — C50411 Malignant neoplasm of upper-outer quadrant of right female breast: Secondary | ICD-10-CM

## 2017-09-24 DIAGNOSIS — Z9221 Personal history of antineoplastic chemotherapy: Secondary | ICD-10-CM

## 2017-09-24 LAB — CBC WITH DIFFERENTIAL/PLATELET
Basophils Absolute: 0 10*3/uL (ref 0.0–0.1)
Basophils Relative: 1 %
EOS PCT: 3 %
Eosinophils Absolute: 0.1 10*3/uL (ref 0.0–0.5)
HEMATOCRIT: 39.6 % (ref 34.8–46.6)
Hemoglobin: 13 g/dL (ref 11.6–15.9)
LYMPHS ABS: 1.6 10*3/uL (ref 0.9–3.3)
LYMPHS PCT: 40 %
MCH: 30.8 pg (ref 25.1–34.0)
MCHC: 32.8 g/dL (ref 31.5–36.0)
MCV: 93.8 fL (ref 79.5–101.0)
MONO ABS: 0.3 10*3/uL (ref 0.1–0.9)
MONOS PCT: 7 %
NEUTROS ABS: 2.1 10*3/uL (ref 1.5–6.5)
Neutrophils Relative %: 49 %
Platelets: 170 10*3/uL (ref 145–400)
RBC: 4.22 MIL/uL (ref 3.70–5.45)
RDW: 13 % (ref 11.2–14.5)
WBC: 4.1 10*3/uL (ref 3.9–10.3)

## 2017-09-24 LAB — COMPREHENSIVE METABOLIC PANEL WITH GFR
ALT: 12 U/L (ref 0–55)
AST: 22 U/L (ref 5–34)
Albumin: 3.7 g/dL (ref 3.5–5.0)
Alkaline Phosphatase: 45 U/L (ref 40–150)
Anion gap: 9 (ref 3–11)
BUN: 14 mg/dL (ref 7–26)
CO2: 26 mmol/L (ref 22–29)
Calcium: 9.2 mg/dL (ref 8.4–10.4)
Chloride: 108 mmol/L (ref 98–109)
Creatinine, Ser: 0.87 mg/dL (ref 0.60–1.10)
GFR calc Af Amer: >60 mL/min
GFR calc non Af Amer: >60 mL/min
Glucose, Bld: 80 mg/dL (ref 70–140)
Potassium: 4.2 mmol/L (ref 3.5–5.1)
Sodium: 143 mmol/L (ref 136–145)
Total Bilirubin: 0.3 mg/dL (ref 0.2–1.2)
Total Protein: 6.4 g/dL (ref 6.4–8.3)

## 2017-09-24 MED ORDER — TAMOXIFEN CITRATE 20 MG PO TABS: 20.0000 mg | ORAL_TABLET | Freq: Every day | ORAL | 2 refills | Status: DC

## 2017-09-24 NOTE — Telephone Encounter (Signed)
Gave avs and calendar ° °

## 2017-11-30 DIAGNOSIS — D225 Melanocytic nevi of trunk: Secondary | ICD-10-CM | POA: Diagnosis not present

## 2017-11-30 DIAGNOSIS — D0472 Carcinoma in situ of skin of left lower limb, including hip: Secondary | ICD-10-CM | POA: Diagnosis not present

## 2017-11-30 DIAGNOSIS — L570 Actinic keratosis: Secondary | ICD-10-CM | POA: Diagnosis not present

## 2017-11-30 DIAGNOSIS — L821 Other seborrheic keratosis: Secondary | ICD-10-CM | POA: Diagnosis not present

## 2017-12-08 DIAGNOSIS — D0472 Carcinoma in situ of skin of left lower limb, including hip: Secondary | ICD-10-CM | POA: Diagnosis not present

## 2018-01-25 DIAGNOSIS — H6983 Other specified disorders of Eustachian tube, bilateral: Secondary | ICD-10-CM | POA: Diagnosis not present

## 2018-01-25 DIAGNOSIS — K219 Gastro-esophageal reflux disease without esophagitis: Secondary | ICD-10-CM | POA: Diagnosis not present

## 2018-01-25 DIAGNOSIS — J385 Laryngeal spasm: Secondary | ICD-10-CM | POA: Diagnosis not present

## 2018-05-17 ENCOUNTER — Telehealth: Payer: Self-pay | Admitting: Internal Medicine

## 2018-05-17 NOTE — Telephone Encounter (Signed)
Ok to schedule patient for Hep A series at her convenience

## 2018-05-17 NOTE — Telephone Encounter (Signed)
Copied from [place] [id number]. Topic: Quick Communication - See Telephone Encounter >> May 17, 2018  9:18 AM Reyne Dumas L wrote: CRM for notification. See Telephone encounter for: 05/17/18.  Pt states that she is going to [place] in January.  She was informed that she may need a Hepatitis shot.  Pt wants to know what PCP recommends. Pt can be reached at [phone]

## 2018-05-19 NOTE — Telephone Encounter (Signed)
Patient is scheduled   

## 2018-05-24 ENCOUNTER — Ambulatory Visit (INDEPENDENT_AMBULATORY_CARE_PROVIDER_SITE_OTHER): Payer: Medicare Other

## 2018-05-24 DIAGNOSIS — Z23 Encounter for immunization: Secondary | ICD-10-CM | POA: Diagnosis not present

## 2018-05-24 DIAGNOSIS — Z299 Encounter for prophylactic measures, unspecified: Secondary | ICD-10-CM

## 2018-08-11 ENCOUNTER — Other Ambulatory Visit: Payer: Self-pay | Admitting: Oncology

## 2018-08-11 DIAGNOSIS — Z170 Estrogen receptor positive status [ER+]: Principal | ICD-10-CM

## 2018-08-11 DIAGNOSIS — C50411 Malignant neoplasm of upper-outer quadrant of right female breast: Secondary | ICD-10-CM

## 2018-09-06 ENCOUNTER — Telehealth: Payer: Self-pay

## 2018-09-06 NOTE — Telephone Encounter (Signed)
Pt has been screened for COVID-19 symptoms.  Denies fever, chills, SOB, cough, n/v/d   She would like to keep her CPE because she likes to have her CPE and mammo same day.   She has been advised no additional visitors to come with her. She expressed understanding.

## 2018-09-07 ENCOUNTER — Other Ambulatory Visit (INDEPENDENT_AMBULATORY_CARE_PROVIDER_SITE_OTHER): Payer: Medicare Other

## 2018-09-07 ENCOUNTER — Telehealth: Payer: Self-pay

## 2018-09-07 ENCOUNTER — Encounter: Payer: Self-pay | Admitting: Internal Medicine

## 2018-09-07 ENCOUNTER — Ambulatory Visit (INDEPENDENT_AMBULATORY_CARE_PROVIDER_SITE_OTHER): Payer: Medicare Other | Admitting: *Deleted

## 2018-09-07 ENCOUNTER — Other Ambulatory Visit: Payer: Self-pay

## 2018-09-07 ENCOUNTER — Other Ambulatory Visit: Payer: Self-pay | Admitting: Internal Medicine

## 2018-09-07 ENCOUNTER — Ambulatory Visit (INDEPENDENT_AMBULATORY_CARE_PROVIDER_SITE_OTHER): Payer: Medicare Other | Admitting: Internal Medicine

## 2018-09-07 VITALS — BP 114/78 | HR 80 | Ht 62.0 in | Wt 136.0 lb

## 2018-09-07 VITALS — BP 114/78 | HR 80 | Temp 98.5°F | Ht 62.0 in | Wt 136.0 lb

## 2018-09-07 DIAGNOSIS — R609 Edema, unspecified: Secondary | ICD-10-CM | POA: Diagnosis not present

## 2018-09-07 DIAGNOSIS — Z0000 Encounter for general adult medical examination without abnormal findings: Secondary | ICD-10-CM | POA: Diagnosis not present

## 2018-09-07 DIAGNOSIS — R7302 Impaired glucose tolerance (oral): Secondary | ICD-10-CM

## 2018-09-07 DIAGNOSIS — E785 Hyperlipidemia, unspecified: Secondary | ICD-10-CM

## 2018-09-07 DIAGNOSIS — R0600 Dyspnea, unspecified: Secondary | ICD-10-CM

## 2018-09-07 LAB — URINALYSIS, ROUTINE W REFLEX MICROSCOPIC
Bilirubin Urine: NEGATIVE
Hgb urine dipstick: NEGATIVE
KETONES UR: NEGATIVE
Leukocytes,Ua: NEGATIVE
Nitrite: NEGATIVE
SPECIFIC GRAVITY, URINE: 1.01 (ref 1.000–1.030)
Total Protein, Urine: NEGATIVE
UROBILINOGEN UA: 0.2 (ref 0.0–1.0)
Urine Glucose: NEGATIVE
pH: 6 (ref 5.0–8.0)

## 2018-09-07 LAB — HEPATIC FUNCTION PANEL
ALT: 13 U/L (ref 0–35)
AST: 19 U/L (ref 0–37)
Albumin: 4.2 g/dL (ref 3.5–5.2)
Alkaline Phosphatase: 37 U/L — ABNORMAL LOW (ref 39–117)
Bilirubin, Direct: 0 mg/dL (ref 0.0–0.3)
Total Bilirubin: 0.3 mg/dL (ref 0.2–1.2)
Total Protein: 6.7 g/dL (ref 6.0–8.3)

## 2018-09-07 LAB — BASIC METABOLIC PANEL
BUN: 10 mg/dL (ref 6–23)
CO2: 31 mEq/L (ref 19–32)
Calcium: 9.1 mg/dL (ref 8.4–10.5)
Chloride: 105 mEq/L (ref 96–112)
Creatinine, Ser: 0.73 mg/dL (ref 0.40–1.20)
GFR: 79.11 mL/min (ref >60.00)
Glucose, Bld: 84 mg/dL (ref 70–99)
Potassium: 3.8 mEq/L (ref 3.5–5.1)
SODIUM: 143 meq/L (ref 135–145)

## 2018-09-07 LAB — CBC WITH DIFFERENTIAL/PLATELET
Basophils Absolute: 0 10*3/uL (ref 0.0–0.1)
Basophils Relative: 0.5 % (ref 0.0–3.0)
EOS PCT: 2.8 % (ref 0.0–5.0)
Eosinophils Absolute: 0.1 10*3/uL (ref 0.0–0.7)
HCT: 41.9 % (ref 36.0–46.0)
Hemoglobin: 14.3 g/dL (ref 12.0–15.0)
Lymphocytes Relative: 30.9 % (ref 12.0–46.0)
Lymphs Abs: 1.3 10*3/uL (ref 0.7–4.0)
MCHC: 34.2 g/dL (ref 30.0–36.0)
MCV: 91.4 fl (ref 78.0–100.0)
Monocytes Absolute: 0.3 10*3/uL (ref 0.1–1.0)
Monocytes Relative: 6.4 % (ref 3.0–12.0)
Neutro Abs: 2.6 10*3/uL (ref 1.4–7.7)
Neutrophils Relative %: 59.4 % (ref 43.0–77.0)
Platelets: 201 10*3/uL (ref 150.0–400.0)
RBC: 4.58 Mil/uL (ref 3.87–5.11)
RDW: 13 % (ref 11.5–15.5)
WBC: 4.3 10*3/uL (ref 4.0–10.5)

## 2018-09-07 LAB — LIPID PANEL
Cholesterol: 232 mg/dL — ABNORMAL HIGH (ref 0–200)
HDL: 56.5 mg/dL (ref >39.00)
LDL Cholesterol: 156 mg/dL — ABNORMAL HIGH (ref 0–99)
NonHDL: 175.9
Total CHOL/HDL Ratio: 4
Triglycerides: 99 mg/dL (ref 0.0–149.0)
VLDL: 19.8 mg/dL (ref 0.0–40.0)

## 2018-09-07 LAB — BRAIN NATRIURETIC PEPTIDE: Pro B Natriuretic peptide (BNP): 45 pg/mL (ref 0.0–100.0)

## 2018-09-07 LAB — TSH: TSH: 2.77 u[IU]/mL (ref 0.35–4.50)

## 2018-09-07 LAB — HEMOGLOBIN A1C: HEMOGLOBIN A1C: 5.8 % (ref 4.6–6.5)

## 2018-09-07 MED ORDER — SIMVASTATIN 10 MG PO TABS: 10.0000 mg | ORAL_TABLET | Freq: Every day | ORAL | 3 refills | Status: DC

## 2018-09-07 NOTE — Assessment & Plan Note (Signed)
Trace only, most likely related to venous insufficiency, for BNP with labs

## 2018-09-07 NOTE — Telephone Encounter (Signed)
Pt has viewed results via MyChart  

## 2018-09-07 NOTE — Patient Instructions (Signed)

## 2018-09-07 NOTE — Telephone Encounter (Signed)
-----   Message from Biagio Borg, MD sent at 09/07/2018 12:27 PM EDT ----- Left message on MyChart, pt to cont same tx except  The test results show that your current treatment is OK, except the LDL cholesterol is still elevated, as you suspected.  Ok to start simvastatin 10 mg per day.  I will send this, and you should hear from the office as well.    Shirron to please inform pt, I will do rx

## 2018-09-07 NOTE — Progress Notes (Signed)
Subjective:    Patient ID: Jaime Young, female    DOB: June 25, 1950, 68 y.o.   MRN: [id number]  HPI  Here for yearly f/u;  Overall doing ok;  Pt denies Chest pain, worsening  DOE, wheezing, orthopnea, PND, worsening LE edema, palpitations, dizziness or syncope, and can have minor SOB with exertions she is not used to.  Pt denies neurological change such as new headache, facial or extremity weakness.  Pt denies polydipsia, polyuria, or low sugar symptoms. Pt states overall good compliance with treatment and medications, good tolerability, and has been trying to follow appropriate diet.  Pt denies worsening depressive symptoms, suicidal ideation or panic. No fever, night sweats, wt loss, loss of appetite, or other constitutional symptoms.  Pt states good ability with ADL's, has low fall risk, home safety reviewed and adequate, no other significant changes in hearing or vision, and only occasionally active with exercise.   Due for colonoscopy aug 2020.   Past Medical History:  Diagnosis Date  . Allergic rhinitis   . Anxiety   . Cancer ([place])    stage I right breast cancer  . Diverticulitis   . Diverticulosis of colon   . DJD (degenerative joint disease) of lumbar spine   . DVT (deep venous thrombosis) ([place]) 1977  . 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 Hyperlipidemia   . Impaired glucose tolerance 11/17/2010  . Migraine   . Osteopenia 09/18/2010  . Post-operative nausea and vomiting   . RADIAL NERVE INJURY   . Raynaud's disease   . RAYNAUD'S DISEASE   . SUPERFICIAL PHLEBITIS 09/01/2007   Past Surgical History:  Procedure Laterality Date  . BACK SURGERY  2002  . BREAST LUMPECTOMY Right 2011   axillary node dissection  . BREAST SURGERY  1989/2001   implants  . [place]  . PARS PLANA VITRECTOMY Left 2013   w/Membranectomy  . TONSILLECTOMY    . TOTAL ABDOMINAL HYSTERECTOMY W/ BILATERAL SALPINGOOPHORECTOMY  1983   secondary to edometriosis    reports that she  has quit smoking. She has never used smokeless tobacco. She reports that she does not drink alcohol or use drugs. family history includes Bone cancer in an other family member; Breast cancer in an other family member; Cancer in her father and sister; Esophageal cancer in her father; Heart disease in her father; Hypertension in her mother; Stomach cancer in her sister and another family member; Stroke in her mother. Allergies  Allergen Reactions  . Dextromethorphan Swelling  . Ciprofloxacin Other (See Comments)    Pt denies  . Codeine Nausea Only  . Hydrocodone Nausea And Vomiting    Pt indicates N/V occurs with all narcotics.   . Sulfonamide Derivatives Nausea Only  . Aflibercept Rash   Current Outpatient Medications on File Prior to Visit  Medication Sig Dispense Refill  . brimonidine-timolol (COMBIGAN) 0.2-0.5 % ophthalmic solution Place 1 drop into the left eye 2 (two) times daily.     . Calcium-Magnesium-Vitamin D (CALCIUM 500) 500-250-200 MG-MG-UNIT TABS Take 3 tablets by mouth daily.    . cyanocobalamin 500 MCG tablet Take 500 mcg by mouth daily.    . Multiple Vitamins tablet Take 1 tablet by mouth daily. Reported on 08/02/2015    . Omega-3 Fatty Acids (FISH OIL) 300 MG CAPS Take 1 capsule by mouth daily.    . ranitidine (ZANTAC) 150 MG tablet Take 1 tablet (150 mg total) by mouth 2 (two) times daily. [place]  tablet 0  . tamoxifen (NOLVADEX) 20 MG tablet Take 1 tablet by mouth once daily 90 tablet 0  . vitamin C (ASCORBIC ACID) 500 MG tablet Take 1,000 mg by mouth daily.     No current facility-administered medications on file prior to visit.    Review of Systems Constitutional: Negative for other unusual diaphoresis, sweats, appetite or weight changes HENT: Negative for other worsening hearing loss, ear pain, facial swelling, mouth sores or neck stiffness.   Eyes: Negative for other worsening pain, redness or other visual disturbance.  Respiratory: Negative for other stridor or  swelling Cardiovascular: Negative for other palpitations or other chest pain  Gastrointestinal: Negative for worsening diarrhea or loose stools, blood in stool, distention or other pain Genitourinary: Negative for hematuria, flank pain or other change in urine volume.  Musculoskeletal: Negative for myalgias or other joint swelling.  Skin: Negative for other color change, or other wound or worsening drainage.  Neurological: Negative for other syncope or numbness. Hematological: Negative for other adenopathy or swelling Psychiatric/Behavioral: Negative for hallucinations, other worsening agitation, SI, self-injury, or new decreased concentration All other system neg per pt    Objective:   Physical Exam BP 114/78   Pulse 80   Temp 98.5 F (36.9 C) (Oral)   Ht 5\' 2"  (1.575 m)   Wt 136 lb (61.7 kg)   SpO2 97%   BMI 24.87 kg/m  VS noted,  Constitutional: Pt is oriented to person, place, and time. Appears well-developed and well-nourished, in no significant distress and comfortable Head: Normocephalic and atraumatic  Eyes: Conjunctivae and EOM are normal. Pupils are equal, round, and reactive to light Right Ear: External ear normal without discharge Left Ear: External ear normal without discharge Nose: Nose without discharge or deformity Mouth/Throat: Oropharynx is without other ulcerations and moist  Neck: Normal range of motion. Neck supple. No JVD present. No tracheal deviation present or significant neck LA or mass Cardiovascular: Normal rate, regular rhythm, normal heart sounds and intact distal pulses.   Pulmonary/Chest: WOB normal and breath sounds without rales or wheezing  Abdominal: Soft. Bowel sounds are normal. NT. No HSM  Musculoskeletal: Normal range of motion. Exhibits no edema Lymphadenopathy: Has no other cervical adenopathy.  Neurological: Pt is alert and oriented to person, place, and time. Pt has normal reflexes. No cranial nerve deficit. Motor grossly intact, Gait  intact Skin: Skin is warm and dry. No rash noted or new ulcerations Psychiatric:  Has normal mood and affect. Behavior is normal without agitation No other exam findings Lab Results  Component Value Date   WBC 4.1 09/24/2017   HGB 13.0 09/24/2017   HCT 39.6 09/24/2017   PLT 170 09/24/2017   GLUCOSE 80 09/24/2017   CHOL 221 (H) 09/02/2017   TRIG 134.0 09/02/2017   HDL 50.20 09/02/2017   LDLDIRECT 160.6 12/20/2007   LDLCALC 144 (H) 09/02/2017   ALT 12 09/24/2017   AST 22 09/24/2017   NA 143 09/24/2017   K 4.2 09/24/2017   CL 108 09/24/2017   CREATININE 0.87 09/24/2017   BUN 14 09/24/2017   CO2 26 09/24/2017   TSH 3.10 09/02/2017   INR 1.00 02/04/2010   HGBA1C 4.9 03/27/2017       Assessment & Plan:

## 2018-09-07 NOTE — Assessment & Plan Note (Signed)
stable overall by history and exam, recent data reviewed with pt, and pt to continue medical treatment as before,  to f/u any worsening symptoms or concerns  

## 2018-09-07 NOTE — Progress Notes (Addendum)
Subjective:   Jaime Young is a 68 y.o. female who presents for Medicare Annual (Subsequent) preventive examination.  Review of Systems:  No ROS.  Medicare Wellness Visit. Additional risk factors are reflected in the social history.  Cardiac Risk Factors include: advanced age (>33men, >37 women);dyslipidemia Sleep patterns: feels rested on waking, gets up 1 times nightly to void and sleeps 7-8 hours nightly.    Home Safety/Smoke Alarms: Feels safe in home. Smoke alarms in place.  Living environment; residence and Firearm Safety: 1-story house/ trailer. Lives with husband, no needs for DME, good support system.     Objective:     Vitals: BP 114/78   Pulse 80   Ht 5\' 2"  (1.575 m)   Wt 136 lb (61.7 kg)   SpO2 97%   BMI 24.87 kg/m   Body mass index is 24.87 kg/m.  Advanced Directives 09/07/2018 08/27/2017 03/23/2017 08/20/2016 09/25/2015 08/02/2015 02/01/2014  Does Patient Have a Medical Advance Directive? Yes No No No No No No  Type of Paramedic of [place];Living will - - - - - -  Does patient want to make changes to medical advance directive? - Yes (ED - Information included in AVS) - - - - -  Copy of [place] in Chart? No - copy requested - - - - - -  Would patient like information on creating a medical advance directive? - - - - - No - patient declined information -    Tobacco Social History   Tobacco Use  Smoking Status Former Smoker  Smokeless Tobacco Never Used     Counseling given: Not Answered  Past Medical History:  Diagnosis Date  . Allergic rhinitis   . Anxiety   . Cancer ([place])    stage I right breast cancer  . Diverticulitis   . Diverticulosis of colon   . DJD (degenerative joint disease) of lumbar spine   . DVT (deep venous thrombosis) ([place]) 1977  . 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 Hyperlipidemia   . Impaired glucose tolerance 11/17/2010  . Migraine   . Osteopenia 09/18/2010  . Post-operative  nausea and vomiting   . RADIAL NERVE INJURY   . Raynaud's disease   . RAYNAUD'S DISEASE   . SUPERFICIAL PHLEBITIS 09/01/2007   Past Surgical History:  Procedure Laterality Date  . BACK SURGERY  2002  . BREAST LUMPECTOMY Right 2011   axillary node dissection  . BREAST SURGERY  1989/2001   implants  . [place]  . PARS PLANA VITRECTOMY Left 2013   w/Membranectomy  . TONSILLECTOMY    . TOTAL ABDOMINAL HYSTERECTOMY W/ BILATERAL SALPINGOOPHORECTOMY  1983   secondary to edometriosis   Family History  Problem Relation Age of Onset  . Stroke Mother   . Hypertension Mother   . Heart disease Father   . Cancer Father   . Esophageal cancer Father   . Cancer Sister   . Stomach cancer Sister   . Breast cancer Other   . Bone cancer Other   . Stomach cancer Other   . Colon cancer Neg Hx   . Rectal cancer Neg Hx    Social History   Socioeconomic History  . Marital status: Married    Spouse name: Not on file  . Number of children: 1  . Years of education: Not on file  . Highest education level: Not on file  Occupational History  . Occupation: ACCOUNT EXECUTIVE  Employer: [employer]  . Occupation: Public house manager  . Financial resource strain: Not hard at all  . Food insecurity:    Worry: Never true    Inability: Never true  . Transportation needs:    Medical: No    Non-medical: No  Tobacco Use  . Smoking status: Former Research scientist (life sciences)  . Smokeless tobacco: Never Used  Substance and Sexual Activity  . Alcohol use: No  . Drug use: No  . Sexual activity: Yes  Lifestyle  . Physical activity:    Days per week: 3 days    Minutes per session: 40 min  . Stress: Not at all  Relationships  . Social connections:    Talks on phone: More than three times a week    Gets together: More than three times a week    Attends religious service: More than 4 times per year    Active member of club or organization: Yes    Attends meetings of clubs or organizations: More  than 4 times per year    Relationship status: Married  Other Topics Concern  . Not on file  Social History Narrative  . Not on file    Outpatient Encounter Medications as of 09/07/2018  Medication Sig  . brimonidine-timolol (COMBIGAN) 0.2-0.5 % ophthalmic solution Place 1 drop into the left eye 2 (two) times daily.   . Calcium-Magnesium-Vitamin D (CALCIUM 500) 500-250-200 MG-MG-UNIT TABS Take 3 tablets by mouth daily.  . cyanocobalamin 500 MCG tablet Take 500 mcg by mouth daily.  . Multiple Vitamins tablet Take 1 tablet by mouth daily. Reported on 08/02/2015  . Omega-3 Fatty Acids (FISH OIL) 300 MG CAPS Take 1 capsule by mouth daily.  . ranitidine (ZANTAC) 150 MG tablet Take 1 tablet (150 mg total) by mouth 2 (two) times daily.  . tamoxifen (NOLVADEX) 20 MG tablet Take 1 tablet by mouth once daily  . vitamin C (ASCORBIC ACID) 500 MG tablet Take 1,000 mg by mouth daily.  . [DISCONTINUED] rosuvastatin (CRESTOR) 20 MG tablet Take 1 tablet (20 mg total) by mouth daily. (Patient not taking: Reported on 09/07/2018)   No facility-administered encounter medications on file as of 09/07/2018.     Activities of Daily Living In your present state of health, do you have any difficulty performing the following activities: 09/07/2018  Hearing? N  Vision? N  Difficulty concentrating or making decisions? N  Walking or climbing stairs? N  Dressing or bathing? N  Doing errands, shopping? N  Preparing Food and eating ? N  Using the Toilet? N  In the past six months, have you accidently leaked urine? N  Do you have problems with loss of bowel control? N  Managing your Medications? N  Managing your Finances? N  Housekeeping or managing your Housekeeping? N  Some recent data might be hidden    Patient Care Team: Biagio Borg, MD as PCP - General Isaiah Blakes [name], MD (Radiology) Magrinat, [name], MD as Consulting Physician (Oncology)    Assessment:   This is a routine wellness examination  for Jaime Young. Physical assessment deferred to PCP.  Exercise Activities and Dietary recommendations Current Exercise Habits: Home exercise routine([employer] daily exercise TV program for seniors resource provided ), Type of exercise: walking, Time (Minutes): 45, Frequency (Times/Week): 3, Weekly Exercise (Minutes/Week): 135, Intensity: Mild, Exercise limited by: None identified  Diet (meal preparation, eat out, water intake, caffeinated beverages, dairy products, fruits and vegetables): in general, a "healthy" diet  , well balanced  Reviewed heart healthy diet. Discussed monitoring carbohydrates. Encouraged patient to increase daily water and healthy fluid intake. Diet education was provided via handout and emmi.  Goals    . 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 Continue exercise, eat healthy, enjoy life, family and worship God.    . Patient Stated     I want to lose weight by increasing my physical activity, using portion control, and monitoring carbohydrates.    . Weight (lb) < 200 lb (90.7 kg)       Fall Risk Fall Risk  09/07/2018 08/27/2017 03/27/2017 08/20/2016 11/28/2014  Falls in the past year? 0 No No No No    Depression Screen PHQ 2/9 Scores 09/07/2018 08/27/2017 03/27/2017 08/20/2016  PHQ - 2 Score 0 0 0 0  PHQ- 9 Score - 0 - -     Cognitive Function       Ad8 score reviewed for issues:  Issues making decisions: no  Less interest in hobbies / activities: no  Repeats questions, stories (family complaining): no  Trouble using ordinary gadgets (microwave, computer, phone):no  Forgets the month or year: no  Mismanaging finances: no  Remembering appts: no  Daily problems with thinking and/or memory: no Ad8 score is= 0  Immunization History  Administered Date(s) Administered  . Hepatitis A, Adult 05/24/2018  . Influenza, High Dose Seasonal PF 04/18/2016  . Pneumococcal Conjugate-13 08/25/2016  . Pneumococcal  Polysaccharide-23 08/27/2017  . Zoster 07/12/2012    Screening Tests Health Maintenance  Topic Date Due  . INFLUENZA VACCINE  09/15/2018 (Originally 01/14/2018)  . COLONOSCOPY  02/02/2019  . MAMMOGRAM  09/09/2019  . TETANUS/TDAP  06/16/2024  . DEXA SCAN  Completed  . Hepatitis C Screening  Completed  . PNA vac Low Risk Adult  Completed      Plan:   Reviewed health maintenance screenings with patient today and relevant education, vaccines, and/or referrals were provided.   Continue doing brain stimulating activities (puzzles, reading, adult coloring books, staying active) to keep memory sharp.   Continue to eat heart healthy diet (full of fruits, vegetables, whole grains, lean protein, water--limit salt, fat, and sugar intake) and increase physical activity as tolerated.   I have personally reviewed and noted the following in the patient's chart:   . Medical and social history . Use of alcohol, tobacco or illicit drugs  . Current medications and supplements . Functional ability and status . Nutritional status . Physical activity . Advanced directives . List of other physicians . Vitals . Screenings to include cognitive, depression, and falls . Referrals and appointments  In addition, I have reviewed and discussed with patient certain preventive protocols, quality metrics, and best practice recommendations. A written personalized care plan for preventive services as well as general preventive health recommendations were provided to patient.     Michiel Cowboy, RN  09/07/2018    Medical screening examination/treatment/procedure(s) were performed by non-physician practitioner and as supervising physician I was immediately available for consultation/collaboration. I agree with above. Cathlean Cower, MD

## 2018-09-07 NOTE — Assessment & Plan Note (Signed)
stable overall by history and exam, recent data reviewed with pt, and pt to continue medical treatment as before,  to f/u any worsening symptoms or concerns, to consider low dose simvastatin for ldl > 100 as per pt there would be on copay

## 2018-09-07 NOTE — Patient Instructions (Signed)
If you cannot attend class in person, you can still exercise at home. Video taped versions of [facility] classes are shown on [employer] (GTN) at 8 am and 1 pm Mondays through Fridays. You can also purchase a copy of the [facility] DVD by calling [place] (GTN) [employer]. GTN is available on [employer] channel 13 with a digital cable box and on [employer] channel 31. GTN is also available on [employer], channel 99. To view GTN, go to channel 99, press OK, select [place], then select GT[employer] to start the channel.  Continue doing brain stimulating activities (puzzles, reading, adult coloring books, staying active) to keep memory sharp.   Continue to eat heart healthy diet (full of fruits, vegetables, whole grains, lean protein, water--limit salt, fat, and sugar intake) and increase physical activity as tolerated.   Jaime Young , Thank you for taking time to come for your Medicare Wellness Visit. I appreciate your ongoing commitment to your health goals. Please review the following plan we discussed and let me know if I can assist you in the future.   These are the goals we discussed: Goals    . 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 Continue exercise, eat healthy, enjoy life, family and worship God.    . Patient Stated     I want to lose weight by increasing my physical activity, using portion control, and monitoring carbohydrates.    . Weight (lb) < 200 lb (90.7 kg)       This is a list of the screening recommended for you and due dates:  Health Maintenance  Topic Date Due  . Flu Shot  09/15/2018*  . Colon Cancer Screening  02/02/2019  . Mammogram  09/09/2019  . Tetanus Vaccine  06/16/2024  . DEXA scan (bone density measurement)  Completed  .  Hepatitis C: One time screening is recommended by Center for Disease Control  (CDC) for  adults born from [birth year] through 1966.   Completed  . Pneumonia  vaccines  Completed  *Topic was postponed. The date shown is not the original due date.   Health Maintenance, Female Adopting a healthy lifestyle and getting preventive care can go a long way to promote health and wellness. Talk with your health care provider about what schedule of regular examinations is right for you. This is a good chance for you to check in with your provider about disease prevention and staying healthy. In between checkups, there are plenty of things you can do on your own. Experts have done a lot of research about which lifestyle changes and preventive measures are most likely to keep you healthy. Ask your health care provider for more information. Weight and diet Eat a healthy diet  Be sure to include plenty of vegetables, fruits, low-fat dairy products, and lean protein.  Do not eat a lot of foods high in solid fats, added sugars, or salt.  Get regular exercise. This is one of the most important things you can do for your health. ? Most adults should exercise for at least 150 minutes each week. The exercise should increase your heart rate and make you sweat (moderate-intensity exercise). ? Most adults should also do strengthening exercises at least twice a week. This is in addition to the moderate-intensity exercise. Maintain a healthy weight  Body mass index (BMI) is a measurement that can be used to identify possible weight problems. It estimates body fat based  on height and weight. Your health care provider can help determine your BMI and help you achieve or maintain a healthy weight.  For females 70 years of age and older: ? A BMI below 18.5 is considered underweight. ? A BMI of 18.5 to 24.9 is normal. ? A BMI of 25 to 29.9 is considered overweight. ? A BMI of 30 and above is considered obese. Watch levels of cholesterol and blood lipids  You should start having your blood tested for lipids and cholesterol at 69 years of age, then have this test every 5  years.  You may need to have your cholesterol levels checked more often if: ? Your lipid or cholesterol levels are high. ? You are older than 69 years of age. ? You are at high risk for heart disease. Cancer screening Lung Cancer  Lung cancer screening is recommended for adults 43-77 years old who are at high risk for lung cancer because of a history of smoking.  A yearly low-dose CT scan of the lungs is recommended for people who: ? Currently smoke. ? Have quit within the past 15 years. ? Have at least a 30-pack-year history of smoking. A pack year is smoking an average of one pack of cigarettes a day for 1 year.  Yearly screening should continue until it has been 15 years since you quit.  Yearly screening should stop if you develop a health problem that would prevent you from having lung cancer treatment. Breast Cancer  Practice breast self-awareness. This means understanding how your breasts normally appear and feel.  It also means doing regular breast self-exams. Let your health care provider know about any changes, no matter how small.  If you are in your 20s or 30s, you should have a clinical breast exam (CBE) by a health care provider every 1-3 years as part of a regular health exam.  If you are 64 or older, have a CBE every year. Also consider having a breast X-ray (mammogram) every year.  If you have a family history of breast cancer, talk to your health care provider about genetic screening.  If you are at high risk for breast cancer, talk to your health care provider about having an MRI and a mammogram every year.  Breast cancer gene (BRCA) assessment is recommended for women who have family members with BRCA-related cancers. BRCA-related cancers include: ? Breast. ? Ovarian. ? Tubal. ? Peritoneal cancers.  Results of the assessment will determine the need for genetic counseling and BRCA1 and BRCA2 testing. Cervical Cancer Your health care provider may recommend  that you be screened regularly for cancer of the pelvic organs (ovaries, uterus, and vagina). This screening involves a pelvic examination, including checking for microscopic changes to the surface of your cervix (Pap test). You may be encouraged to have this screening done every 3 years, beginning at age 69.  For women ages 55-65, health care providers may recommend pelvic exams and Pap testing every 3 years, or they may recommend the Pap and pelvic exam, combined with testing for human papilloma virus (HPV), every 5 years. Some types of HPV increase your risk of cervical cancer. Testing for HPV may also be done on women of any age with unclear Pap test results.  Other health care providers may not recommend any screening for nonpregnant women who are considered low risk for pelvic cancer and who do not have symptoms. Ask your health care provider if a screening pelvic exam is right for you.  If  you have had past treatment for cervical cancer or a condition that could lead to cancer, you need Pap tests and screening for cancer for at least 20 years after your treatment. If Pap tests have been discontinued, your risk factors (such as having a new sexual partner) need to be reassessed to determine if screening should resume. Some women have medical problems that increase the chance of getting cervical cancer. In these cases, your health care provider may recommend more frequent screening and Pap tests. Colorectal Cancer  This type of cancer can be detected and often prevented.  Routine colorectal cancer screening usually begins at 69 years of age and continues through 69 years of age.  Your health care provider may recommend screening at an earlier age if you have risk factors for colon cancer.  Your health care provider may also recommend using home test kits to check for hidden blood in the stool.  A small camera at the end of a tube can be used to examine your colon directly (sigmoidoscopy or  colonoscopy). This is done to check for the earliest forms of colorectal cancer.  Routine screening usually begins at age 44.  Direct examination of the colon should be repeated every 5-10 years through 69 years of age. However, you may need to be screened more often if early forms of precancerous polyps or small growths are found. Skin Cancer  Check your skin from head to toe regularly.  Tell your health care provider about any new moles or changes in moles, especially if there is a change in a mole's shape or color.  Also tell your health care provider if you have a mole that is larger than the size of a pencil eraser.  Always use sunscreen. Apply sunscreen liberally and repeatedly throughout the day.  Protect yourself by wearing long sleeves, pants, a wide-brimmed hat, and sunglasses whenever you are outside. Heart disease, diabetes, and high blood pressure  High blood pressure causes heart disease and increases the risk of stroke. High blood pressure is more likely to develop in: ? People who have blood pressure in the high end of the normal range (130-139/85-89 mm Hg). ? People who are overweight or obese. ? People who are African American.  If you are 85-38 years of age, have your blood pressure checked every 3-5 years. If you are 49 years of age or older, have your blood pressure checked every year. You should have your blood pressure measured twice-once when you are at a hospital or clinic, and once when you are not at a hospital or clinic. Record the average of the two measurements. To check your blood pressure when you are not at a hospital or clinic, you can use: ? An automated blood pressure machine at a pharmacy. ? A home blood pressure monitor.  If you are between 25 years and 62 years old, ask your health care provider if you should take aspirin to prevent strokes.  Have regular diabetes screenings. This involves taking a blood sample to check your fasting blood sugar  level. ? If you are at a normal weight and have a low risk for diabetes, have this test once every three years after 69 years of age. ? If you are overweight and have a high risk for diabetes, consider being tested at a younger age or more often. Preventing infection Hepatitis B  If you have a higher risk for hepatitis B, you should be screened for this virus. You are considered at high  risk for hepatitis B if: ? You were born in a country where hepatitis B is common. Ask your health care provider which countries are considered high risk. ? Your parents were born in a high-risk country, and you have not been immunized against hepatitis B (hepatitis B vaccine). ? You have HIV or AIDS. ? You use needles to inject street drugs. ? You live with someone who has hepatitis B. ? You have had sex with someone who has hepatitis B. ? You get hemodialysis treatment. ? You take certain medicines for conditions, including cancer, organ transplantation, and autoimmune conditions. Hepatitis C  Blood testing is recommended for: ? Everyone born from [birth year] through 1966. ? Anyone with known risk factors for hepatitis C. Sexually transmitted infections (STIs)  You should be screened for sexually transmitted infections (STIs) including gonorrhea and chlamydia if: ? You are sexually active and are younger than 69 years of age. ? You are older than 69 years of age and your health care provider tells you that you are at risk for this type of infection. ? Your sexual activity has changed since you were last screened and you are at an increased risk for chlamydia or gonorrhea. Ask your health care provider if you are at risk.  If you do not have HIV, but are at risk, it may be recommended that you take a prescription medicine daily to prevent HIV infection. This is called pre-exposure prophylaxis (PrEP). You are considered at risk if: ? You are sexually active and do not regularly use condoms or know the HIV status  of your partner(s). ? You take drugs by injection. ? You are sexually active with a partner who has HIV. Talk with your health care provider about whether you are at high risk of being infected with HIV. If you choose to begin PrEP, you should first be tested for HIV. You should then be tested every 3 months for as long as you are taking PrEP. Pregnancy  If you are premenopausal and you may become pregnant, ask your health care provider about preconception counseling.  If you may become pregnant, take 400 to 800 micrograms (mcg) of folic acid every day.  If you want to prevent pregnancy, talk to your health care provider about birth control (contraception). Osteoporosis and menopause  Osteoporosis is a disease in which the bones lose minerals and strength with aging. This can result in serious bone fractures. Your risk for osteoporosis can be identified using a bone density scan.  If you are 27 years of age or older, or if you are at risk for osteoporosis and fractures, ask your health care provider if you should be screened.  Ask your health care provider whether you should take a calcium or vitamin D supplement to lower your risk for osteoporosis.  Menopause may have certain physical symptoms and risks.  Hormone replacement therapy may reduce some of these symptoms and risks. Talk to your health care provider about whether hormone replacement therapy is right for you. Follow these instructions at home:  Schedule regular health, dental, and eye exams.  Stay current with your immunizations.  Do not use any tobacco products including cigarettes, chewing tobacco, or electronic cigarettes.  If you are pregnant, do not drink alcohol.  If you are breastfeeding, limit how much and how often you drink alcohol.  Limit alcohol intake to no more than 1 drink per day for nonpregnant women. One drink equals 12 ounces of beer, 5 ounces of Datha Kissinger, or 1  ounces of hard liquor.  Do not use street  drugs.  Do not share needles.  Ask your health care provider for help if you need support or information about quitting drugs.  Tell your health care provider if you often feel depressed.  Tell your health care provider if you have ever been abused or do not feel safe at home. This information is not intended to replace advice given to you by your health care provider. Make sure you discuss any questions you have with your health care provider. Document Released: 12/16/2010 Document Revised: 11/08/2015 Document Reviewed: 03/06/2015 Elsevier Interactive Patient Education  2019 [employer].

## 2018-09-10 DIAGNOSIS — D2222 Melanocytic nevi of left ear and external auricular canal: Secondary | ICD-10-CM | POA: Diagnosis not present

## 2018-09-10 DIAGNOSIS — L821 Other seborrheic keratosis: Secondary | ICD-10-CM | POA: Diagnosis not present

## 2018-09-10 DIAGNOSIS — Z85828 Personal history of other malignant neoplasm of skin: Secondary | ICD-10-CM | POA: Diagnosis not present

## 2018-09-10 DIAGNOSIS — D225 Melanocytic nevi of trunk: Secondary | ICD-10-CM | POA: Diagnosis not present

## 2018-09-10 DIAGNOSIS — L603 Nail dystrophy: Secondary | ICD-10-CM | POA: Diagnosis not present

## 2018-09-10 DIAGNOSIS — H0265 Xanthelasma of left lower eyelid: Secondary | ICD-10-CM | POA: Diagnosis not present

## 2018-09-15 MED ORDER — TAMOXIFEN CITRATE 20 MG PO TABS: 20.0000 mg | ORAL_TABLET | Freq: Every day | ORAL | 0 refills | Status: DC

## 2018-09-21 DIAGNOSIS — H2511 Age-related nuclear cataract, right eye: Secondary | ICD-10-CM | POA: Diagnosis not present

## 2018-09-21 DIAGNOSIS — H34832 Tributary (branch) retinal vein occlusion, left eye, with macular edema: Secondary | ICD-10-CM | POA: Diagnosis not present

## 2018-09-21 DIAGNOSIS — H35373 Puckering of macula, bilateral: Secondary | ICD-10-CM | POA: Diagnosis not present

## 2018-09-21 DIAGNOSIS — Z961 Presence of intraocular lens: Secondary | ICD-10-CM | POA: Diagnosis not present

## 2018-09-23 ENCOUNTER — Ambulatory Visit: Payer: Medicare Other | Admitting: Oncology

## 2018-09-23 ENCOUNTER — Other Ambulatory Visit: Payer: Medicare Other

## 2018-11-19 DIAGNOSIS — Z1231 Encounter for screening mammogram for malignant neoplasm of breast: Secondary | ICD-10-CM | POA: Diagnosis not present

## 2018-11-19 DIAGNOSIS — Z853 Personal history of malignant neoplasm of breast: Secondary | ICD-10-CM | POA: Diagnosis not present

## 2019-01-05 ENCOUNTER — Encounter: Payer: Self-pay | Admitting: Gastroenterology

## 2019-02-04 ENCOUNTER — Other Ambulatory Visit: Payer: Self-pay | Admitting: Oncology

## 2019-02-04 DIAGNOSIS — C50411 Malignant neoplasm of upper-outer quadrant of right female breast: Secondary | ICD-10-CM

## 2019-02-04 DIAGNOSIS — Z170 Estrogen receptor positive status [ER+]: Secondary | ICD-10-CM

## 2019-02-23 NOTE — Progress Notes (Signed)
[place]  [phone] [phone]) [phone]    ID: Jaime Young   DOB: [birth year]  MR#: [id number]  BWG#:[id number]  Patient Care Team: Jaime Borg, MD as PCP - General Jaime Blakes Jaime Fee, MD (Radiology) , Jaime Dad, MD as Consulting Physician (Oncology) OTHER:    CHIEF COMPLAINT:  Hx of Right Breast Cancer  CURRENT TREATMENT: tamoxifen    INTERVAL HISTORY: Jaime Young returns today for her estrogen receptor positive breast cancer.   She continues on tamoxifen, which she tolerates well. She does endorse some fatigue, but she denies hot flashes.   Her most recent mammography was at [facility] 11/19/2018 showing breast density category B.  There was no evidence of malignancy.   REVIEW OF SYSTEMS: Jaime Young continues to have pain in her right axilla and the medial aspect of her right upper arm.  She understands that this is scar tissue from her surgery and does not mean breast cancer.  Nevertheless it is bothersome to her and of course it reminds her that she did have breast cancer and that she is vulnerable.  She worries because some years ago she had a CT scan that showed something and she is not sure what it showed and should we be repeating that.  She does a lot of yard work and occasionally has a little bit of a cough related to that.  Aside from these issues a detailed review of systems today was noncontributory   BREAST CANCER HISTORY:  from the original intake note:   She herself palpated a mass in her right breast, and brought it to Dr. Josie Young attention.  He set her up for a screening diagnostic mammography on July 09, 2009.  Dr. March Young performed the procedure, found no definite mammographic abnormality corresponding to the palpable complaint.  The ultrasound of the right breast did show a mixed echogenic mass at approximately 12 o'clock, extending to the implant capsule, and measuring 2.5 cm.  There was at least one abnormal lymph node in the  right axilla, measuring 2.2 cm.    With this information, the patient was recalled for biopsy performed January 31,2011, and this showed (Jaime Young and Jaime Young) an invasive ductal carcinoma with micropapillary features with the lymph node biopsy positive for a very similar looking tumor.  Grade of this tumor is not estimated.  The prognostic panel showed it to be estrogen receptor positive at 56%, progesterone receptor positive at 25%, with a borderline MIB-1 at 19%.  There was no amplification of HER2 by CISH with a ratio of 1.50.    With this information, the patient met with Dr. Donne Young, and bilateral breast MRIs were obtained July 18, 2009.  There was an enhancing 2.8 cm mass corresponding to the previously biopsied malignancy, and an enlarged right axillary lymph node measuring 2.1 cm.  The left breast was unremarkable.  Dr. Donne Young discussed these findings with the patient, and suggested neoadjuvant treatment.  Her only option would be a modified radical mastectomy.  He felt it was most reasonable to start with chemotherapy with a view to possibly making lumpectomy and radiation therapy possible.    Her subsequent history is as detailed below.   PAST MEDICAL HISTORY: Past Medical History:  Diagnosis Date  . Allergic rhinitis   . Anxiety   . Cancer ([place])    stage I right breast cancer  . Diverticulitis   . Diverticulosis of colon   . DJD (degenerative joint disease) of lumbar spine   . DVT (deep venous  thrombosis) ([place]) 1977  . 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 Hyperlipidemia   . Impaired glucose tolerance 11/17/2010  . Migraine   . Osteopenia 09/18/2010  . Post-operative nausea and vomiting   . RADIAL NERVE INJURY   . Raynaud's disease   . RAYNAUD'S DISEASE   . SUPERFICIAL PHLEBITIS 09/01/2007  The past medical history is significant for endometriosis.  Of course she has breast implants in place.  These were removed and replaced in 2001 because of contractures.  She  is status post tonsillectomy and adenoidectomy.    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 Silvestre Young.  She has also had left eye cataract surgery.    PAST SURGICAL HISTORY: Past Surgical History:  Procedure Laterality Date  . BACK SURGERY  2002  . BREAST LUMPECTOMY Right 2011   axillary node dissection  . BREAST SURGERY  1989/2001   implants  . [place]  . PARS PLANA VITRECTOMY Left 2013   w/Membranectomy  . TONSILLECTOMY    . TOTAL ABDOMINAL HYSTERECTOMY W/ BILATERAL SALPINGOOPHORECTOMY  1983   secondary to edometriosis    FAMILY HISTORY Family History  Problem Relation Age of Onset  . Stroke Mother   . Hypertension Mother   . Heart disease Father   . Cancer Father   . Esophageal cancer Father   . Cancer Sister   . Stomach cancer Sister   . Breast cancer Other   . Bone cancer Other   . Stomach cancer Other   . Colon cancer Neg Hx   . Rectal cancer Neg Hx   The patient's  father died at the age of 14 from what sounds like a gastroesophageal junction tumor.  The patient's  mother died at the age of 70 after multiple strokes.  The patient has five brothers and three sisters, all of her siblings are half-siblings.  The problems are all on her father's side.  The sister, Jaime Young, had breast and ovarian cancer.  She is deceased, and was never tested for BRCA1 and 2.  Second sister had throat cancer.  There is no other history of cancer in the immediate family to her knowledge.     GYNECOLOGIC HISTORY:  (Updated Jaime 2015) She is GXP1.  First pregnancy to term age 75.  Status post TAH/BSO in 1983 secondary to endometriosis. She took estrogen replacement between 1983 and 2010.  Went off the [place] patch at the time of diagnosis in 2011.    SOCIAL HISTORY:  (updated September 2020). She worked for the [employer]  here in town but has retired.  Her husband, Jaime Young, works for [employer] in the Photographer.  Daughter, Jaime Young, is a stay-at-home mom.  The patient's son-in-law died suddenly at home 10/30/18.  The autopsy results are pending.  Jaime Young has 3 grandchildren.  They are being homeschooled.  The patient attends a [employer].      ADVANCED DIRECTIVES: Not in place   HEALTH MAINTENANCE:   (updated 08/29/2013)  Social History   Tobacco Use  . Smoking status: Former Research scientist (life sciences)  . Smokeless tobacco: Never Used  Substance Use Topics  . Alcohol use: No  . Drug use: No     Colonoscopy:  2004  PAP: Feb 2014/Dr. Lomax  Bone density:  09/11/2010 (Dr. Ubaldo Glassing), osteopenia  Lipid panel:  Jan 2014/Dr. [name]  Current Outpatient Medications  Medication Sig Dispense Refill  . brimonidine-timolol (COMBIGAN) 0.2-0.5 % ophthalmic solution Place 1 drop into the left eye 2 (two) times daily.     . Calcium-Magnesium-Vitamin D (CALCIUM 500) 500-250-200 MG-MG-UNIT TABS Take 3 tablets by mouth daily.    . cyanocobalamin 500 MCG tablet Take 500 mcg by mouth daily.    . Multiple Vitamins tablet Take 1 tablet by mouth daily. Reported on 08/02/2015    . Omega-3 Fatty Acids (FISH OIL) 300 MG CAPS Take 1 capsule by mouth daily.    . ranitidine (ZANTAC) 150 MG tablet Take 1 tablet (150 mg total) by mouth 2 (two) times daily. 60 tablet 0  . simvastatin (ZOCOR) 10 MG tablet Take 1 tablet (10 mg total) by mouth at bedtime. 90 tablet 3  . tamoxifen (NOLVADEX) 20 MG tablet Take 1 tablet by mouth once daily 90 tablet 0  . vitamin C (ASCORBIC ACID) 500 MG tablet Take 1,000 mg by mouth daily.     No current facility-administered medications for this visit.     OBJECTIVE: middle-aged white woman in no acute distress  Vitals:   02/24/19 0954  BP: 132/67  Pulse: 71  Resp: 18  Temp: 98.3 F (36.8 C)  SpO2: 100%  Body mass index is 25.68 kg/m.    ECOG:  1 Filed Weights   02/24/19 0954  Weight: 140 lb  6.4 oz (63.7 kg)    Sclerae unicteric, EOMs intact Wearing a mask No cervical or supraclavicular adenopathy Lungs no rales or rhonchi Heart regular rate and rhythm Abd soft, nontender, positive bowel sounds MSK no focal spinal tenderness, no upper extremity lymphedema Neuro: nonfocal, well oriented, appropriate affect Breasts: On the right she is status post lumpectomy and radiation, with implant augmentation.  The left breast is status post implant placement.  There is no evidence of disease recurrence.  Both axillae are benign.    LAB RESULTS: Lab Results  Component Value Date   WBC 4.4 02/24/2019   NEUTROABS 2.4 02/24/2019   HGB 13.9 02/24/2019   HCT 41.9 02/24/2019   MCV 91.9 02/24/2019   PLT 185 02/24/2019      Chemistry      Component Value Date/Time   NA 143 09/07/2018 0937   NA 142 09/16/2016 0746   K 3.8 09/07/2018 0937   K 3.9 09/16/2016 0746   CL 105 09/07/2018 0937   CL 105 08/02/2012 1513   CO2 31 09/07/2018 0937   CO2 26 09/16/2016 0746   BUN 10 09/07/2018 0937   BUN 10.6 09/16/2016 0746   CREATININE 0.73 09/07/2018 0937   CREATININE 0.8 09/16/2016 0746      Component Value Date/Time   CALCIUM 9.1 09/07/2018 0937   CALCIUM 9.2 09/16/2016 0746   ALKPHOS 37 (L) 09/07/2018 0937   ALKPHOS 38 (L) 09/16/2016 0746   AST 19 09/07/2018 0937   AST 21 09/16/2016 0746   ALT 13 09/07/2018 0937   ALT 17 09/16/2016 0746   BILITOT 0.3 09/07/2018 0937   BILITOT 0.22 09/16/2016 0746      STUDIES: No results found.  ASSESSMENT: 68 y.o. [place] woman   (1)  status post right breast and axillary lymph node biopsy January 2011, both positive for a clinical T2 N1 invasive ductal carcinoma which was estrogen and progesterone receptor-positive, HER-2/neu-negative with an MIB-1 of 19%.  (2)  Neoadjuvantly she received 4 cycles of dose-dense doxorubicin and cyclophosphamide followed by 3 doses of weekly paclitaxel discontinued secondary to neuropathy (which  has  resolved), followed by carboplatin and gemcitabine for 2 cycles with very poor tolerance,completed July 2011  (2) underwent right lumpectomy and axillary lymph node dissection August 2011 for a residual 1.5 cm invasive ductal carcinoma, grade 2, involving 2/11 lymph nodes.  Margins were negative.   (3)  completed radiation therapy in November 2011, then started letrozole but this had to be discontinued April 2012 because of tolerance issues.   (4) on tamoxifen as of May 2012, Plan is to continue for a total of 10 years  PLAN:  Amalee is now 8 years out from definitive surgery for her breast cancer with no evidence of disease recurrence.  This is very favorable.  I discussed pain medication for her right axillary discomfort, specifically Aleve plus Tylenol.  We also discussed gabapentin.  At this point she wants to try some stretching and massage.  She did not want to go to rehab.  We reviewed her cholesterol results which show an elevated LDL.  She will follow-up on this with her primary care physician.  I have encouraged her to exercise on a regular basis, particularly walking 30 to 45 minutes daily.  They are taking appropriate pandemic precautions  She knows to call for any other issue that may develop before her next visit here.   , Jaime Dad, MD  02/24/19 10:19 AM Medical Oncology and Hematology [facility] [address] [place], [place] [address] Tel. [phone]    Fax. [phone]   I, Wilburn Mylar, am acting as scribe for Dr. Virgie Young. .  I, Lurline Del MD, have reviewed the above documentation for accuracy and completeness, and I agree with the above.

## 2019-02-24 ENCOUNTER — Other Ambulatory Visit: Payer: Self-pay

## 2019-02-24 ENCOUNTER — Inpatient Hospital Stay: Payer: Medicare Other | Attending: Oncology

## 2019-02-24 ENCOUNTER — Inpatient Hospital Stay (HOSPITAL_BASED_OUTPATIENT_CLINIC_OR_DEPARTMENT_OTHER): Payer: Medicare Other | Admitting: Oncology

## 2019-02-24 VITALS — BP 132/67 | HR 71 | Temp 98.3°F | Resp 18 | Ht 62.0 in | Wt 140.4 lb

## 2019-02-24 DIAGNOSIS — Z8719 Personal history of other diseases of the digestive system: Secondary | ICD-10-CM | POA: Diagnosis not present

## 2019-02-24 DIAGNOSIS — Z853 Personal history of malignant neoplasm of breast: Secondary | ICD-10-CM | POA: Diagnosis not present

## 2019-02-24 DIAGNOSIS — E785 Hyperlipidemia, unspecified: Secondary | ICD-10-CM | POA: Insufficient documentation

## 2019-02-24 DIAGNOSIS — Z823 Family history of stroke: Secondary | ICD-10-CM | POA: Insufficient documentation

## 2019-02-24 DIAGNOSIS — Z800 Family history of malignant neoplasm of digestive organs: Secondary | ICD-10-CM | POA: Insufficient documentation

## 2019-02-24 DIAGNOSIS — C50411 Malignant neoplasm of upper-outer quadrant of right female breast: Secondary | ICD-10-CM

## 2019-02-24 DIAGNOSIS — Z923 Personal history of irradiation: Secondary | ICD-10-CM | POA: Insufficient documentation

## 2019-02-24 DIAGNOSIS — E7800 Pure hypercholesterolemia, unspecified: Secondary | ICD-10-CM | POA: Insufficient documentation

## 2019-02-24 DIAGNOSIS — Z8249 Family history of ischemic heart disease and other diseases of the circulatory system: Secondary | ICD-10-CM | POA: Diagnosis not present

## 2019-02-24 DIAGNOSIS — Z79899 Other long term (current) drug therapy: Secondary | ICD-10-CM | POA: Diagnosis not present

## 2019-02-24 DIAGNOSIS — Z79810 Long term (current) use of selective estrogen receptor modulators (SERMs): Secondary | ICD-10-CM | POA: Insufficient documentation

## 2019-02-24 DIAGNOSIS — Z170 Estrogen receptor positive status [ER+]: Secondary | ICD-10-CM | POA: Insufficient documentation

## 2019-02-24 DIAGNOSIS — Z86718 Personal history of other venous thrombosis and embolism: Secondary | ICD-10-CM | POA: Insufficient documentation

## 2019-02-24 DIAGNOSIS — Z87891 Personal history of nicotine dependence: Secondary | ICD-10-CM | POA: Diagnosis not present

## 2019-02-24 DIAGNOSIS — Z803 Family history of malignant neoplasm of breast: Secondary | ICD-10-CM | POA: Diagnosis not present

## 2019-02-24 LAB — CBC WITH DIFFERENTIAL/PLATELET
Abs Immature Granulocytes: 0.01 10*3/uL (ref 0.00–0.07)
Basophils Absolute: 0 10*3/uL (ref 0.0–0.1)
Basophils Relative: 1 %
Eosinophils Absolute: 0.2 10*3/uL (ref 0.0–0.5)
Eosinophils Relative: 5 %
HCT: 41.9 % (ref 36.0–46.0)
Hemoglobin: 13.9 g/dL (ref 12.0–15.0)
Immature Granulocytes: 0 %
Lymphocytes Relative: 32 %
Lymphs Abs: 1.4 10*3/uL (ref 0.7–4.0)
MCH: 30.5 pg (ref 26.0–34.0)
MCHC: 33.2 g/dL (ref 30.0–36.0)
MCV: 91.9 fL (ref 80.0–100.0)
Monocytes Absolute: 0.3 10*3/uL (ref 0.1–1.0)
Monocytes Relative: 7 %
Neutro Abs: 2.4 10*3/uL (ref 1.7–7.7)
Neutrophils Relative %: 55 %
Platelets: 185 10*3/uL (ref 150–400)
RBC: 4.56 MIL/uL (ref 3.87–5.11)
RDW: 11.9 % (ref 11.5–15.5)
WBC: 4.4 10*3/uL (ref 4.0–10.5)
nRBC: 0 % (ref 0.0–0.2)

## 2019-02-24 LAB — COMPREHENSIVE METABOLIC PANEL
ALT: 12 U/L (ref 0–44)
AST: 23 U/L (ref 15–41)
Albumin: 4 g/dL (ref 3.5–5.0)
Alkaline Phosphatase: 37 U/L — ABNORMAL LOW (ref 38–126)
Anion gap: 8 (ref 5–15)
BUN: 11 mg/dL (ref 8–23)
CO2: 30 mmol/L (ref 22–32)
Calcium: 9 mg/dL (ref 8.9–10.3)
Chloride: 106 mmol/L (ref 98–111)
Creatinine, Ser: 0.91 mg/dL (ref 0.44–1.00)
GFR calc Af Amer: >60 mL/min (ref >60)
GFR calc non Af Amer: >60 mL/min (ref >60)
Glucose, Bld: 84 mg/dL (ref 70–99)
Potassium: 3.9 mmol/L (ref 3.5–5.1)
Sodium: 144 mmol/L (ref 135–145)
Total Bilirubin: 0.3 mg/dL (ref 0.3–1.2)
Total Protein: 6.6 g/dL (ref 6.5–8.1)

## 2019-02-24 MED ORDER — TAMOXIFEN CITRATE 20 MG PO TABS: 20.0000 mg | ORAL_TABLET | Freq: Every day | ORAL | 4 refills | Status: DC

## 2019-03-04 ENCOUNTER — Ambulatory Visit (INDEPENDENT_AMBULATORY_CARE_PROVIDER_SITE_OTHER): Payer: Medicare Other

## 2019-03-04 ENCOUNTER — Other Ambulatory Visit: Payer: Self-pay

## 2019-03-04 DIAGNOSIS — Z23 Encounter for immunization: Secondary | ICD-10-CM

## 2019-07-17 ENCOUNTER — Encounter: Payer: Self-pay | Admitting: Internal Medicine

## 2019-08-06 ENCOUNTER — Ambulatory Visit: Payer: Medicare Other | Attending: Internal Medicine

## 2019-08-06 DIAGNOSIS — Z23 Encounter for immunization: Secondary | ICD-10-CM | POA: Insufficient documentation

## 2019-08-06 NOTE — Progress Notes (Signed)
   Covid-19 Vaccination Clinic  Name:  Jaime Young    MRN: [id number] DOB: 07-20-1950  08/06/2019  Jaime Young was observed post Covid-19 immunization for 15 minutes without incidence. She was provided with Vaccine Information Sheet and instruction to access the V-Safe system.   Jaime Young was instructed to call 911 with any severe reactions post vaccine: Marland Kitchen Difficulty breathing  . Swelling of your face and throat  . A fast heartbeat  . A bad rash all over your body  . Dizziness and weakness    Immunizations Administered    Name Date Dose VIS Date Route   Pfizer COVID-19 Vaccine 08/06/2019  9:59 AM 0.3 mL 05/27/2019 Intramuscular   Manufacturer: [place]   Lot: [id number]   [place]: [id number]

## 2019-08-29 ENCOUNTER — Ambulatory Visit: Payer: Medicare Other | Attending: Internal Medicine

## 2019-08-29 DIAGNOSIS — Z23 Encounter for immunization: Secondary | ICD-10-CM

## 2019-08-29 NOTE — Progress Notes (Signed)
   Covid-19 Vaccination Clinic  Name:  Jaime Young    MRN: [id number] DOB: Sep 02, 1950  08/29/2019  [name] was observed post Covid-19 immunization for 15 minutes without incident. She was provided with Vaccine Information Sheet and instruction to access the V-Safe system.   [name] was instructed to call 911 with any severe reactions post vaccine: Marland Kitchen Difficulty breathing  . Swelling of face and throat  . A fast heartbeat  . A bad rash all over body  . Dizziness and weakness   Immunizations Administered    Name Date Dose VIS Date Route   Pfizer COVID-19 Vaccine 08/29/2019  8:28 AM 0.3 mL 05/27/2019 Intramuscular   Manufacturer: [place]   Lot: [id number]   [place]: [id number]

## 2019-09-08 ENCOUNTER — Ambulatory Visit (INDEPENDENT_AMBULATORY_CARE_PROVIDER_SITE_OTHER): Payer: Medicare Other | Admitting: Internal Medicine

## 2019-09-08 ENCOUNTER — Ambulatory Visit: Payer: Medicare Other

## 2019-09-08 ENCOUNTER — Encounter: Payer: Self-pay | Admitting: Internal Medicine

## 2019-09-08 ENCOUNTER — Other Ambulatory Visit: Payer: Self-pay

## 2019-09-08 VITALS — BP 132/78 | HR 72 | Temp 97.6°F | Ht 62.0 in | Wt 138.8 lb

## 2019-09-08 DIAGNOSIS — Z86010 Personal history of colonic polyps: Secondary | ICD-10-CM | POA: Diagnosis not present

## 2019-09-08 DIAGNOSIS — E559 Vitamin D deficiency, unspecified: Secondary | ICD-10-CM

## 2019-09-08 DIAGNOSIS — E611 Iron deficiency: Secondary | ICD-10-CM | POA: Diagnosis not present

## 2019-09-08 DIAGNOSIS — R7302 Impaired glucose tolerance (oral): Secondary | ICD-10-CM | POA: Diagnosis not present

## 2019-09-08 DIAGNOSIS — E538 Deficiency of other specified B group vitamins: Secondary | ICD-10-CM | POA: Diagnosis not present

## 2019-09-08 DIAGNOSIS — F411 Generalized anxiety disorder: Secondary | ICD-10-CM

## 2019-09-08 DIAGNOSIS — E785 Hyperlipidemia, unspecified: Secondary | ICD-10-CM

## 2019-09-08 LAB — VITAMIN D 25 HYDROXY (VIT D DEFICIENCY, FRACTURES): VITD: 69.18 ng/mL (ref 30.00–100.00)

## 2019-09-08 LAB — URINALYSIS, ROUTINE W REFLEX MICROSCOPIC
Bilirubin Urine: NEGATIVE
Hgb urine dipstick: NEGATIVE
Ketones, ur: NEGATIVE
Nitrite: NEGATIVE
RBC / HPF: NONE SEEN (ref 0–?)
Specific Gravity, Urine: 1.01 (ref 1.000–1.030)
Total Protein, Urine: NEGATIVE
Urine Glucose: NEGATIVE
Urobilinogen, UA: 0.2 (ref 0.0–1.0)
pH: 7 (ref 5.0–8.0)

## 2019-09-08 LAB — LIPID PANEL
Cholesterol: 182 mg/dL (ref 0–200)
HDL: 49.1 mg/dL (ref >39.00)
LDL Cholesterol: 114 mg/dL — ABNORMAL HIGH (ref 0–99)
NonHDL: 133.39
Total CHOL/HDL Ratio: 4
Triglycerides: 98 mg/dL (ref 0.0–149.0)
VLDL: 19.6 mg/dL (ref 0.0–40.0)

## 2019-09-08 LAB — CBC WITH DIFFERENTIAL/PLATELET
Basophils Absolute: 0 10*3/uL (ref 0.0–0.1)
Basophils Relative: 0.7 % (ref 0.0–3.0)
Eosinophils Absolute: 0.2 10*3/uL (ref 0.0–0.7)
Eosinophils Relative: 2.9 % (ref 0.0–5.0)
HCT: 41.2 % (ref 36.0–46.0)
Hemoglobin: 14.2 g/dL (ref 12.0–15.0)
Lymphocytes Relative: 28.4 % (ref 12.0–46.0)
Lymphs Abs: 1.5 10*3/uL (ref 0.7–4.0)
MCHC: 34.3 g/dL (ref 30.0–36.0)
MCV: 92.3 fl (ref 78.0–100.0)
Monocytes Absolute: 0.4 10*3/uL (ref 0.1–1.0)
Monocytes Relative: 7.2 % (ref 3.0–12.0)
Neutro Abs: 3.2 10*3/uL (ref 1.4–7.7)
Neutrophils Relative %: 60.8 % (ref 43.0–77.0)
Platelets: 224 10*3/uL (ref 150.0–400.0)
RBC: 4.47 Mil/uL (ref 3.87–5.11)
RDW: 12.9 % (ref 11.5–15.5)
WBC: 5.2 10*3/uL (ref 4.0–10.5)

## 2019-09-08 LAB — HEMOGLOBIN A1C: Hgb A1c MFr Bld: 5.8 % (ref 4.6–6.5)

## 2019-09-08 LAB — BASIC METABOLIC PANEL
BUN: 10 mg/dL (ref 6–23)
CO2: 31 mEq/L (ref 19–32)
Calcium: 9.3 mg/dL (ref 8.4–10.5)
Chloride: 104 mEq/L (ref 96–112)
Creatinine, Ser: 0.89 mg/dL (ref 0.40–1.20)
GFR: 62.75 mL/min (ref >60.00)
Glucose, Bld: 89 mg/dL (ref 70–99)
Potassium: 3.9 mEq/L (ref 3.5–5.1)
Sodium: 144 mEq/L (ref 135–145)

## 2019-09-08 LAB — HEPATIC FUNCTION PANEL
ALT: 13 U/L (ref 0–35)
AST: 23 U/L (ref 0–37)
Albumin: 4.4 g/dL (ref 3.5–5.2)
Alkaline Phosphatase: 36 U/L — ABNORMAL LOW (ref 39–117)
Bilirubin, Direct: 0.1 mg/dL (ref 0.0–0.3)
Total Bilirubin: 0.3 mg/dL (ref 0.2–1.2)
Total Protein: 7 g/dL (ref 6.0–8.3)

## 2019-09-08 LAB — IBC PANEL
Iron: 78 ug/dL (ref 42–145)
Saturation Ratios: 28.7 % (ref 20.0–50.0)
Transferrin: 194 mg/dL — ABNORMAL LOW (ref 212.0–360.0)

## 2019-09-08 LAB — TSH: TSH: 2.87 u[IU]/mL (ref 0.35–4.50)

## 2019-09-08 LAB — VITAMIN B12: Vitamin B-12: >1500 pg/mL — ABNORMAL HIGH (ref 211–911)

## 2019-09-08 NOTE — Patient Instructions (Signed)
You will be contacted regarding the referral for: colonoscopy ° °Please continue all other medications as before, and refills have been done if requested. ° °Please have the pharmacy call with any other refills you may need. ° °Please continue your efforts at being more active, low cholesterol diet, and weight control. ° °You are otherwise up to date with prevention measures today. ° °Please keep your appointments with your specialists as you may have planned ° °Please go to the LAB at the blood drawing area for the tests to be done ° °You will be contacted by phone if any changes need to be made immediately.  Otherwise, you will receive a letter about your results with an explanation, but please check with MyChart first. ° °Please remember to sign up for MyChart if you have not done so, as this will be important to you in the future with finding out test results, communicating by private email, and scheduling acute appointments online when needed. ° °Please make an Appointment to return for your 1 year visit, or sooner if needed °

## 2019-09-08 NOTE — Progress Notes (Signed)
Subjective:    Patient ID: Jaime Young, female    DOB: 11-24-1950, 69 y.o.   MRN: [id number]  HPI  Here for yearly f/u;  Overall doing ok;  Pt denies Chest pain, worsening SOB, DOE, wheezing, orthopnea, PND, worsening LE edema, palpitations, dizziness or syncope.  Pt denies neurological change such as new headache, facial or extremity weakness.  Pt denies polydipsia, polyuria, or low sugar symptoms. Pt states overall good compliance with treatment and medications, good tolerability, and has been trying to follow appropriate diet.  Pt denies worsening depressive symptoms, suicidal ideation or panic. No fever, night sweats, wt loss, loss of appetite, or other constitutional symptoms.  Pt states good ability with ADL's, has low fall risk, home safety reviewed and adequate, no other significant changes in hearing or vision, and only occasionally active with exercise. BP Readings from Last 3 Encounters:  09/08/19 132/78  02/24/19 132/67  09/07/18 114/78   Wt Readings from Last 3 Encounters:  09/08/19 138 lb 12.8 oz (63 kg)  02/24/19 140 lb 6.4 oz (63.7 kg)  09/07/18 136 lb (61.7 kg)  Due for f/u colonoscopy.  No new complaints Past Medical History:  Diagnosis Date  . Allergic rhinitis   . Anxiety   . Cancer ([place])    stage I right breast cancer  . Diverticulitis   . Diverticulosis of colon   . DJD (degenerative joint disease) of lumbar spine   . DVT (deep venous thrombosis) ([place]) 1977  . 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 Hyperlipidemia   . Impaired glucose tolerance 11/17/2010  . Migraine   . Osteopenia 09/18/2010  . Post-operative nausea and vomiting   . RADIAL NERVE INJURY   . Raynaud's disease   . RAYNAUD'S DISEASE   . SUPERFICIAL PHLEBITIS 09/01/2007   Past Surgical History:  Procedure Laterality Date  . BACK SURGERY  2002  . BREAST LUMPECTOMY Right 2011   axillary node dissection  . BREAST SURGERY  1989/2001   implants  . [place]  . PARS PLANA  VITRECTOMY Left 2013   w/Membranectomy  . TONSILLECTOMY    . TOTAL ABDOMINAL HYSTERECTOMY W/ BILATERAL SALPINGOOPHORECTOMY  1983   secondary to edometriosis    reports that she has quit smoking. She has never used smokeless tobacco. She reports that she does not drink alcohol or use drugs. family history includes Bone cancer in an other family member; Breast cancer in an other family member; Cancer in her father and sister; Esophageal cancer in her father; Heart disease in her father; Hypertension in her mother; Stomach cancer in her sister and another family member; Stroke in her mother. Allergies  Allergen Reactions  . Dextromethorphan Swelling  . Ciprofloxacin Other (See Comments)    Pt denies  . Codeine Nausea Only  . Hydrocodone Nausea And Vomiting    Pt indicates N/V occurs with all narcotics.   . Sulfonamide Derivatives Nausea Only  . Aflibercept Rash   Current Outpatient Medications on File Prior to Visit  Medication Sig Dispense Refill  . brimonidine-timolol (COMBIGAN) 0.2-0.5 % ophthalmic solution Place 1 drop into the left eye 2 (two) times daily.     . Calcium-Magnesium-Vitamin D (CALCIUM 500) 500-250-200 MG-MG-UNIT TABS Take 3 tablets by mouth daily.    . cyanocobalamin 500 MCG tablet Take 500 mcg by mouth daily.    . Multiple Vitamins tablet Take 1 tablet by mouth daily. Reported on 08/02/2015    . Omega-3 Fatty Acids (  FISH OIL) 300 MG CAPS Take 1 capsule by mouth daily.    . simvastatin (ZOCOR) 10 MG tablet Take 1 tablet (10 mg total) by mouth at bedtime. 90 tablet 3  . tamoxifen (NOLVADEX) 20 MG tablet Take 1 tablet (20 mg total) by mouth daily. 90 tablet 4  . vitamin C (ASCORBIC ACID) 500 MG tablet Take 1,000 mg by mouth daily.     No current facility-administered medications on file prior to visit.   Review of Systems All otherwise neg per pt     Objective:   Physical Exam BP 132/78   Pulse 72   Temp 97.6 F (36.4 C)   Ht 5\' 2"  (1.575 m)   Wt 138 lb 12.8 oz  (63 kg)   SpO2 100%   BMI 25.39 kg/m  VS noted,  Constitutional: Pt appears in NAD HENT: Head: NCAT.  Right Ear: External ear normal.  Left Ear: External ear normal.  Eyes: . Pupils are equal, round, and reactive to light. Conjunctivae and EOM are normal Nose: without d/c or deformity Neck: Neck supple. Gross normal ROM Cardiovascular: Normal rate and regular rhythm.   Pulmonary/Chest: Effort normal and breath sounds without rales or wheezing.  Abd:  Soft, NT, ND, + BS, no organomegaly Neurological: Pt is alert. At baseline orientation, motor grossly intact Skin: Skin is warm. No rashes, other new lesions, no LE edema Psychiatric: Pt behavior is normal without agitation  All otherwise neg per pt Lab Results  Component Value Date   WBC 5.2 09/08/2019   HGB 14.2 09/08/2019   HCT 41.2 09/08/2019   PLT 224.0 09/08/2019   GLUCOSE 89 09/08/2019   CHOL 182 09/08/2019   TRIG 98.0 09/08/2019   HDL 49.10 09/08/2019   LDLDIRECT 160.6 12/20/2007   LDLCALC 114 (H) 09/08/2019   ALT 13 09/08/2019   AST 23 09/08/2019   NA 144 09/08/2019   K 3.9 09/08/2019   CL 104 09/08/2019   CREATININE 0.89 09/08/2019   BUN 10 09/08/2019   CO2 31 09/08/2019   TSH 2.87 09/08/2019   INR 1.00 02/04/2010   HGBA1C 5.8 09/08/2019      Assessment & Plan:

## 2019-09-11 ENCOUNTER — Encounter: Payer: Self-pay | Admitting: Internal Medicine

## 2019-09-11 NOTE — Assessment & Plan Note (Signed)
stable overall by history and exam, recent data reviewed with pt, and pt to continue medical treatment as before,  to f/u any worsening symptoms or concerns  

## 2019-09-11 NOTE — Assessment & Plan Note (Addendum)

## 2019-09-27 ENCOUNTER — Encounter: Payer: Self-pay | Admitting: Internal Medicine

## 2019-11-17 ENCOUNTER — Telehealth: Payer: Self-pay | Admitting: Internal Medicine

## 2019-11-17 NOTE — Telephone Encounter (Signed)
New message:    Pt is calling and states she would like to go over some results from her life line test. She states there is something going on with her thyroid and would like to discuss it. I have let the pt know that Dr. [name] is out of the office this week but she would still like a call back. Please advise.`

## 2019-11-18 NOTE — Telephone Encounter (Signed)
LVM for pt to call the clinic back to discuss her concerns.

## 2019-11-25 DIAGNOSIS — Z1231 Encounter for screening mammogram for malignant neoplasm of breast: Secondary | ICD-10-CM | POA: Diagnosis not present

## 2019-11-30 ENCOUNTER — Telehealth: Payer: Self-pay | Admitting: Internal Medicine

## 2019-11-30 DIAGNOSIS — R946 Abnormal results of thyroid function studies: Secondary | ICD-10-CM

## 2019-11-30 NOTE — Telephone Encounter (Signed)
Received pt information about possible thyroid abnormality on lifeline screening  [place] for thyroid ultrasound - please let pt know

## 2019-12-03 ENCOUNTER — Telehealth: Payer: Self-pay | Admitting: Internal Medicine

## 2019-12-03 DIAGNOSIS — R946 Abnormal results of thyroid function studies: Secondary | ICD-10-CM

## 2019-12-03 NOTE — Telephone Encounter (Signed)
Location changed 

## 2019-12-05 NOTE — Telephone Encounter (Signed)
Spoke with pt and pt states she has been scheduled for 12/20/2019 for a [place] of her thyroids.

## 2019-12-20 ENCOUNTER — Ambulatory Visit
Admission: RE | Admit: 2019-12-20 | Discharge: 2019-12-20 | Disposition: A | Discharge status: Home / Self Care | Payer: Medicare Other | Source: Ambulatory Visit | Attending: Internal Medicine | Admitting: Internal Medicine

## 2019-12-20 DIAGNOSIS — R946 Abnormal results of thyroid function studies: Secondary | ICD-10-CM

## 2020-01-12 ENCOUNTER — Other Ambulatory Visit: Payer: Self-pay | Admitting: Internal Medicine

## 2020-01-12 NOTE — Telephone Encounter (Signed)
Please refill as per office routine med refill policy (all routine meds refilled for 3 mo or monthly per pt preference up to one year from last visit, then month to month grace period for 3 mo, then further med refills will have to be denied)  

## 2020-02-07 DIAGNOSIS — H35373 Puckering of macula, bilateral: Secondary | ICD-10-CM | POA: Diagnosis not present

## 2020-02-07 DIAGNOSIS — H34832 Tributary (branch) retinal vein occlusion, left eye, with macular edema: Secondary | ICD-10-CM | POA: Diagnosis not present

## 2020-02-07 DIAGNOSIS — H2511 Age-related nuclear cataract, right eye: Secondary | ICD-10-CM | POA: Diagnosis not present

## 2020-02-07 DIAGNOSIS — Z961 Presence of intraocular lens: Secondary | ICD-10-CM | POA: Diagnosis not present

## 2020-02-24 ENCOUNTER — Other Ambulatory Visit: Payer: Self-pay | Admitting: *Deleted

## 2020-02-24 DIAGNOSIS — Z170 Estrogen receptor positive status [ER+]: Secondary | ICD-10-CM

## 2020-02-26 NOTE — Progress Notes (Signed)
[place]  [phone] [phone]    ID: Jaime Young   DOB: 1951-03-16  MR#: [id number]  GNF#:[id number]  Patient Care Team: Jaime Borg, MD as PCP - General Jaime Young Jaime Fee, MD (Radiology) Jaime Young, Jaime Dad, MD as Consulting Physician (Oncology) OTHER:    CHIEF COMPLAINT:  Hx of Right Breast Cancer  CURRENT TREATMENT: tamoxifen    INTERVAL HISTORY: Jaime Young returns today for her estrogen receptor positive breast cancer.   She continues on tamoxifen, which she tolerates well.  She has never had any significant side effects or complaints regarding this medication  Mammography at [facility] 11/25/2019 showed the breast density category B.  There was no evidence of malignancy.  Of note, she also underwent thyroid ultrasound on 12/20/2019 showing a mildly heterogeneous but normal sized thyroid gland without discrete nodule or mass.   REVIEW OF SYSTEMS: Jaime Young is very stressed because her father-in-law who is 3 is now in the nursing home section of [facility].  He has significant congestive heart failure.  The whole family has been vaccinated and she is planning to get the booster shot as well.  She is not exercising regularly although she does have a treadmill at home.  A detailed review of systems today was otherwise noncontributory   BREAST CANCER HISTORY:  from the original intake note:   She herself palpated a mass in her right breast, and brought it to Dr. Josie Young attention.  He set her up for a screening diagnostic mammography on July 09, 2009.  Dr. March Young performed the procedure, found no definite mammographic abnormality corresponding to the palpable complaint.  The ultrasound of the right breast did show a mixed echogenic mass at approximately 12 o'clock, extending to the implant capsule, and measuring 2.5 cm.  There was at least one abnormal lymph node in the right axilla, measuring 2.2 cm.    With this information, the patient  was recalled for biopsy performed January 31,2011, and this showed ([id number]) an invasive ductal carcinoma with micropapillary features with the lymph node biopsy positive for a very similar looking tumor.  Grade of this tumor is not estimated.  The prognostic panel showed it to be estrogen receptor positive at 56%, progesterone receptor positive at 25%, with a borderline MIB-1 at 19%.  There was no amplification of HER2 by CISH with a ratio of 1.50.    With this information, the patient met with Dr. Donne Young, and bilateral breast MRIs were obtained July 18, 2009.  There was an enhancing 2.8 cm mass corresponding to the previously biopsied malignancy, and an enlarged right axillary lymph node measuring 2.1 cm.  The left breast was unremarkable.  Dr. Donne Young discussed these findings with the patient, and suggested neoadjuvant treatment.  Her only option would be a modified radical mastectomy.  He felt it was most reasonable to start with chemotherapy with a view to possibly making lumpectomy and radiation therapy possible.    Her subsequent history is as detailed below.   PAST MEDICAL HISTORY: Past Medical History:  Diagnosis Date  . Allergic rhinitis   . Anxiety   . Cancer ([place])    stage I right breast cancer  . Diverticulitis   . Diverticulosis of colon   . DJD (degenerative joint disease) of lumbar spine   . DVT (deep venous thrombosis) ([place]) 1977  . 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 Hyperlipidemia   . Impaired glucose tolerance 11/17/2010  . Migraine   .  Osteopenia 09/18/2010  . Post-operative nausea and vomiting   . RADIAL NERVE INJURY   . Raynaud's disease   . RAYNAUD'S DISEASE   . SUPERFICIAL PHLEBITIS 09/01/2007  The past medical history is significant for endometriosis.  Of course she has breast implants in place.  These were removed and replaced in 2001 because of contractures.  She is status post tonsillectomy and adenoidectomy.    She has "crinkling  of the cornea", and apparently underwent left cornea surgery with infectious complications, leading her to change ophthalmologists, and she is currently being followed by Dr. Silvestre Young.  She has also had left eye cataract surgery.    PAST SURGICAL HISTORY: Past Surgical History:  Procedure Laterality Date  . BACK SURGERY  2002  . BREAST LUMPECTOMY Right 2011   axillary node dissection  . BREAST SURGERY  1989/2001   implants  . [place]  . PARS PLANA VITRECTOMY Left 2013   w/Membranectomy  . TONSILLECTOMY    . TOTAL ABDOMINAL HYSTERECTOMY W/ BILATERAL SALPINGOOPHORECTOMY  1983   secondary to edometriosis    FAMILY HISTORY Family History  Problem Relation Age of Onset  . Stroke Mother   . Hypertension Mother   . Heart disease Father   . Cancer Father   . Esophageal cancer Father   . Cancer Sister   . Stomach cancer Sister   . Breast cancer Other   . Bone cancer Other   . Stomach cancer Other   . Colon cancer Neg Hx   . Rectal cancer Neg Hx   The patient's  father died at the age of 6 from what sounds like a gastroesophageal junction tumor.  The patient's  mother died at the age of 32 after multiple strokes.  The patient has five brothers and three sisters, all of her siblings are half-siblings.  The problems are all on her father's side.  The sister, Jaime Young, had breast and ovarian cancer.  She is deceased, and was never tested for BRCA1 and 2.  Second sister had throat cancer.  There is no other history of cancer in the immediate family to her knowledge.     GYNECOLOGIC HISTORY:  (Updated Jaime 2015) She is GXP1.  First pregnancy to term age 67.  Status post TAH/BSO in 1983 secondary to endometriosis. She took estrogen replacement between 1983 and 2010.  Went off the [place] patch at the time of diagnosis in 2011.    SOCIAL HISTORY:  (updated September 2020). She worked for the [employer] here in town but has retired.  Her husband, Jaime Young, works for [employer]  in the Photographer.  Daughter, Jaime Young, is a stay-at-home mom.  The patient's son-in-law died suddenly at home 11/19/18, presumably from cardiac causes.  Gladyce has 3 grandchildren.  They are being homeschooled.  The patient attends a [employer].      ADVANCED DIRECTIVES: Not in place   HEALTH MAINTENANCE:  Social History   Tobacco Use  . Smoking status: Former Research scientist (life sciences)  . Smokeless tobacco: Never Used  Vaping Use  . Vaping Use: Never used  Substance Use Topics  . Alcohol use: No  . Drug use: No     Colonoscopy:  2004  PAP: Feb 2014/Dr. Lomax  Bone density:  09/11/2010 (Dr. Ubaldo Glassing), osteopenia  Lipid panel:  Jan 2014/Dr. [name]   Current Outpatient Medications  Medication Sig Dispense Refill  . brimonidine-timolol (COMBIGAN) 0.2-0.5 % ophthalmic solution Place 1 drop into the left eye 2 (two) times daily.     Marland Kitchen  Calcium-Magnesium-Vitamin D (CALCIUM 500) 500-250-200 MG-MG-UNIT TABS Take 3 tablets by mouth daily.    . cyanocobalamin 500 MCG tablet Take 500 mcg by mouth daily.    . Multiple Vitamins tablet Take 1 tablet by mouth daily. Reported on 08/02/2015    . Omega-3 Fatty Acids (FISH OIL) 300 MG CAPS Take 1 capsule by mouth daily.    . simvastatin (ZOCOR) 10 MG tablet TAKE 1 TABLET BY MOUTH AT BEDTIME 90 tablet 0  . tamoxifen (NOLVADEX) 20 MG tablet Take 1 tablet (20 mg total) by mouth daily. 90 tablet 4  . vitamin C (ASCORBIC ACID) 500 MG tablet Take 1,000 mg by mouth daily.     No current facility-administered medications for this visit.    OBJECTIVE: White woman who appears stated age  69:   02/27/20 0916  BP: 131/76  Pulse: 74  Resp: 18  Temp: 97.8 F (36.6 C)  SpO2: 100%  Body mass index is 25.42 kg/m.    ECOG:  1 Filed Weights   02/27/20 0916  Weight: 139 lb (63 kg)    Sclerae unicteric, EOMs intact Wearing a mask No cervical or supraclavicular adenopathy Lungs no rales or rhonchi Heart regular rate and rhythm Abd soft, nontender,  positive bowel sounds MSK no focal spinal tenderness, no upper extremity lymphedema Neuro: nonfocal, well oriented, appropriate affect Breasts: The right breast is status post lumpectomy and radiation with implant augmentation.  The left breast is status post implant placement.  There are no suspicious findings.  Both axillae are benign.   LAB RESULTS: Lab Results  Component Value Date   WBC 4.8 02/27/2020   NEUTROABS 3.0 02/27/2020   HGB 13.6 02/27/2020   HCT 41.5 02/27/2020   MCV 93.9 02/27/2020   PLT 185 02/27/2020      Chemistry      Component Value Date/Time   NA 144 09/08/2019 0918   NA 142 09/16/2016 0746   K 3.9 09/08/2019 0918   K 3.9 09/16/2016 0746   CL 104 09/08/2019 0918   CL 105 08/02/2012 1513   CO2 31 09/08/2019 0918   CO2 26 09/16/2016 0746   BUN 10 09/08/2019 0918   BUN 10.6 09/16/2016 0746   CREATININE 0.89 09/08/2019 0918   CREATININE 0.8 09/16/2016 0746      Component Value Date/Time   CALCIUM 9.3 09/08/2019 0918   CALCIUM 9.2 09/16/2016 0746   ALKPHOS 36 (L) 09/08/2019 0918   ALKPHOS 38 (L) 09/16/2016 0746   AST 23 09/08/2019 0918   AST 21 09/16/2016 0746   ALT 13 09/08/2019 0918   ALT 17 09/16/2016 0746   BILITOT 0.3 09/08/2019 0918   BILITOT 0.22 09/16/2016 0746      STUDIES: No results found.   ASSESSMENT: 69 y.o. [place] woman   (1)  status post right breast and axillary lymph node biopsy January 2011, both positive for a clinical T2 N1 invasive ductal carcinoma which was estrogen and progesterone receptor-positive, HER-2/neu-negative with an MIB-1 of 19%.  (2)  Neoadjuvantly she received 4 cycles of dose-dense doxorubicin and cyclophosphamide followed by 3 doses of weekly paclitaxel discontinued secondary to neuropathy (which has resolved), followed by carboplatin and gemcitabine for 2 cycles with very poor tolerance,completed July 2011  (2) underwent right lumpectomy and axillary lymph node dissection August 2011 for a residual  1.5 cm invasive ductal carcinoma, grade 2, involving 2/11 lymph nodes.  Margins were negative.   (3)  completed radiation therapy in November 2011, then started letrozole but this  had to be discontinued April 2012 because of tolerance issues.   (4) on tamoxifen as of May 2012, Plan is to continue for a total of 10 years   PLAN:  Kaytelynn is now 10 years out from definitive surgery for her breast cancer with no evidence of disease recurrence.  This is very favorable.  She will complete 10 years of tamoxifen in May 2022 and go off the medication at that time.  I am going to see her one last time July 2022, after her June 2022 mammogram.  At that point she will be ready to "graduate".  She knows to call for any other issue that may develop before the next visit  Total encounter time 25 minutes.*  Carolyne Whitsel, Jaime Dad, MD  02/27/20 9:25 AM Medical Oncology and Hematology [facility] [place], [place] [address] Tel. [phone]    Fax. [phone]   I, Wilburn Mylar, am acting as scribe for Dr. Virgie Young. [place] Yarbro.  I, Lurline Del MD, have reviewed the above documentation for accuracy and completeness, and I agree with the above.   *Total Encounter Time as defined by the Centers for Medicare and Medicaid Services includes, in addition to the face-to-face time of a patient visit (documented in the note above) non-face-to-face time: obtaining and reviewing outside history, ordering and reviewing medications, tests or procedures, care coordination (communications with other health care professionals or caregivers) and documentation in the medical record.

## 2020-02-27 ENCOUNTER — Inpatient Hospital Stay: Payer: Medicare Other

## 2020-02-27 ENCOUNTER — Telehealth: Payer: Self-pay | Admitting: Oncology

## 2020-02-27 ENCOUNTER — Other Ambulatory Visit: Payer: Self-pay

## 2020-02-27 ENCOUNTER — Inpatient Hospital Stay: Payer: Medicare Other | Attending: Oncology | Admitting: Oncology

## 2020-02-27 VITALS — BP 131/76 | HR 74 | Temp 97.8°F | Resp 18 | Ht 62.0 in | Wt 139.0 lb

## 2020-02-27 DIAGNOSIS — Z79810 Long term (current) use of selective estrogen receptor modulators (SERMs): Secondary | ICD-10-CM | POA: Insufficient documentation

## 2020-02-27 DIAGNOSIS — Z79899 Other long term (current) drug therapy: Secondary | ICD-10-CM | POA: Diagnosis not present

## 2020-02-27 DIAGNOSIS — C50811 Malignant neoplasm of overlapping sites of right female breast: Secondary | ICD-10-CM | POA: Insufficient documentation

## 2020-02-27 DIAGNOSIS — Z90722 Acquired absence of ovaries, bilateral: Secondary | ICD-10-CM | POA: Insufficient documentation

## 2020-02-27 DIAGNOSIS — Z801 Family history of malignant neoplasm of trachea, bronchus and lung: Secondary | ICD-10-CM | POA: Insufficient documentation

## 2020-02-27 DIAGNOSIS — Z8719 Personal history of other diseases of the digestive system: Secondary | ICD-10-CM | POA: Insufficient documentation

## 2020-02-27 DIAGNOSIS — E785 Hyperlipidemia, unspecified: Secondary | ICD-10-CM | POA: Diagnosis not present

## 2020-02-27 DIAGNOSIS — Z86718 Personal history of other venous thrombosis and embolism: Secondary | ICD-10-CM | POA: Insufficient documentation

## 2020-02-27 DIAGNOSIS — Z87891 Personal history of nicotine dependence: Secondary | ICD-10-CM | POA: Diagnosis not present

## 2020-02-27 DIAGNOSIS — Z823 Family history of stroke: Secondary | ICD-10-CM | POA: Diagnosis not present

## 2020-02-27 DIAGNOSIS — Z170 Estrogen receptor positive status [ER+]: Secondary | ICD-10-CM | POA: Diagnosis not present

## 2020-02-27 DIAGNOSIS — Z8249 Family history of ischemic heart disease and other diseases of the circulatory system: Secondary | ICD-10-CM | POA: Insufficient documentation

## 2020-02-27 DIAGNOSIS — Z800 Family history of malignant neoplasm of digestive organs: Secondary | ICD-10-CM | POA: Diagnosis not present

## 2020-02-27 DIAGNOSIS — Z803 Family history of malignant neoplasm of breast: Secondary | ICD-10-CM | POA: Insufficient documentation

## 2020-02-27 DIAGNOSIS — C773 Secondary and unspecified malignant neoplasm of axilla and upper limb lymph nodes: Secondary | ICD-10-CM | POA: Insufficient documentation

## 2020-02-27 DIAGNOSIS — C50411 Malignant neoplasm of upper-outer quadrant of right female breast: Secondary | ICD-10-CM

## 2020-02-27 DIAGNOSIS — Z808 Family history of malignant neoplasm of other organs or systems: Secondary | ICD-10-CM | POA: Diagnosis not present

## 2020-02-27 DIAGNOSIS — Z923 Personal history of irradiation: Secondary | ICD-10-CM | POA: Insufficient documentation

## 2020-02-27 LAB — CBC WITH DIFFERENTIAL (CANCER CENTER ONLY)
Abs Immature Granulocytes: 0.01 10*3/uL (ref 0.00–0.07)
Basophils Absolute: 0 10*3/uL (ref 0.0–0.1)
Basophils Relative: 0 %
Eosinophils Absolute: 0.2 10*3/uL (ref 0.0–0.5)
Eosinophils Relative: 4 %
HCT: 41.5 % (ref 36.0–46.0)
Hemoglobin: 13.6 g/dL (ref 12.0–15.0)
Immature Granulocytes: 0 %
Lymphocytes Relative: 25 %
Lymphs Abs: 1.2 10*3/uL (ref 0.7–4.0)
MCH: 30.8 pg (ref 26.0–34.0)
MCHC: 32.8 g/dL (ref 30.0–36.0)
MCV: 93.9 fL (ref 80.0–100.0)
Monocytes Absolute: 0.4 10*3/uL (ref 0.1–1.0)
Monocytes Relative: 7 %
Neutro Abs: 3 10*3/uL (ref 1.7–7.7)
Neutrophils Relative %: 64 %
Platelet Count: 185 10*3/uL (ref 150–400)
RBC: 4.42 MIL/uL (ref 3.87–5.11)
RDW: 12.2 % (ref 11.5–15.5)
WBC Count: 4.8 10*3/uL (ref 4.0–10.5)
nRBC: 0 % (ref 0.0–0.2)

## 2020-02-27 LAB — CMP (CANCER CENTER ONLY)
ALT: 19 U/L (ref 0–44)
AST: 34 U/L (ref 15–41)
Albumin: 3.8 g/dL (ref 3.5–5.0)
Alkaline Phosphatase: 35 U/L — ABNORMAL LOW (ref 38–126)
Anion gap: 5 (ref 5–15)
BUN: 8 mg/dL (ref 8–23)
CO2: 29 mmol/L (ref 22–32)
Calcium: 8.9 mg/dL (ref 8.9–10.3)
Chloride: 106 mmol/L (ref 98–111)
Creatinine: 0.8 mg/dL (ref 0.44–1.00)
GFR, Est AFR Am: >60 mL/min (ref >60)
GFR, Estimated: >60 mL/min (ref >60)
Glucose, Bld: 80 mg/dL (ref 70–99)
Potassium: 3.9 mmol/L (ref 3.5–5.1)
Sodium: 140 mmol/L (ref 135–145)
Total Bilirubin: 0.3 mg/dL (ref 0.3–1.2)
Total Protein: 6.6 g/dL (ref 6.5–8.1)

## 2020-02-27 NOTE — Telephone Encounter (Signed)
Scheduled appts per 9/13 los. Gave pt print out of AVS.

## 2020-03-01 ENCOUNTER — Other Ambulatory Visit: Payer: Self-pay | Admitting: Internal Medicine

## 2020-03-01 ENCOUNTER — Telehealth: Payer: Self-pay | Admitting: Internal Medicine

## 2020-03-01 DIAGNOSIS — R300 Dysuria: Secondary | ICD-10-CM

## 2020-03-01 NOTE — Telephone Encounter (Signed)
Very sorry, I dont prescribe antibiotics without an exam or at least testing as many many many many many many many times it is not actually a uti for most persons no matter how adamant they are

## 2020-03-01 NOTE — Telephone Encounter (Signed)
Patient is calling to request anitbiotics for a UTI  Can she come for a lab if needed without an appointment

## 2020-03-02 ENCOUNTER — Other Ambulatory Visit: Payer: Medicare Other

## 2020-03-02 ENCOUNTER — Other Ambulatory Visit: Payer: Self-pay

## 2020-03-02 DIAGNOSIS — R300 Dysuria: Secondary | ICD-10-CM

## 2020-03-02 NOTE — Telephone Encounter (Signed)
   Patient made aware order for urinalysis enetered

## 2020-03-03 NOTE — Telephone Encounter (Signed)
Patient called in this morning stating that she was still experiencing extreme pain when urinating with little to no relief after taking AZO. Expressed how upset she is that it took over a day to be informed that she could leave a urine sample at the office to be tested. Informed patient that urine culture is still in the process of being ran with [facility] and that it usually takes up to 48 hours for results. Requested I send a message to Dr. [name] to be on the look out for her results first thing Monday morning.

## 2020-03-04 ENCOUNTER — Other Ambulatory Visit: Payer: Self-pay | Admitting: Internal Medicine

## 2020-03-04 ENCOUNTER — Encounter: Payer: Self-pay | Admitting: Internal Medicine

## 2020-03-04 LAB — URINE CULTURE

## 2020-03-04 LAB — URINALYSIS, ROUTINE W REFLEX MICROSCOPIC
Bacteria, UA: NONE SEEN /HPF
Bilirubin Urine: NEGATIVE
Glucose, UA: NEGATIVE
Hyaline Cast: NONE SEEN /LPF
Ketones, ur: NEGATIVE
Nitrite: NEGATIVE
Protein, ur: NEGATIVE
Specific Gravity, Urine: 1.005 (ref 1.001–1.03)
Squamous Epithelial / HPF: NONE SEEN /HPF (ref <=5)
pH: 6 (ref 5.0–8.0)

## 2020-03-04 MED ORDER — CEPHALEXIN 500 MG PO CAPS: 500.0000 mg | ORAL_CAPSULE | Freq: Three times a day (TID) | ORAL | 0 refills | Status: AC

## 2020-03-14 ENCOUNTER — Encounter: Payer: Self-pay | Admitting: Internal Medicine

## 2020-04-09 ENCOUNTER — Other Ambulatory Visit: Payer: Self-pay | Admitting: Internal Medicine

## 2020-04-09 NOTE — Telephone Encounter (Signed)
Please refill as per office routine med refill policy (all routine meds refilled for 3 mo or monthly per pt preference up to one year from last visit, then month to month grace period for 3 mo, then further med refills will have to be denied)  

## 2020-04-14 ENCOUNTER — Other Ambulatory Visit: Payer: Self-pay

## 2020-04-14 ENCOUNTER — Ambulatory Visit: Payer: Medicare Other | Attending: Internal Medicine

## 2020-04-14 DIAGNOSIS — Z23 Encounter for immunization: Secondary | ICD-10-CM

## 2020-04-14 NOTE — Progress Notes (Signed)
   Covid-19 Vaccination Clinic  Name:  Jaime Young    MRN: [id number] DOB: 11-01-1950  04/14/2020  Jaime Young was observed post Covid-19 immunization for 15 minutes without incident. She was provided with Vaccine Information Sheet and instruction to access the V-Safe system.   Jaime Young was instructed to call 911 with any severe reactions post vaccine: Marland Kitchen Difficulty breathing  . Swelling of face and throat  . A fast heartbeat  . A bad rash all over body  . Dizziness and weakness

## 2020-05-17 ENCOUNTER — Encounter: Payer: Self-pay | Admitting: Internal Medicine

## 2020-05-17 ENCOUNTER — Ambulatory Visit (INDEPENDENT_AMBULATORY_CARE_PROVIDER_SITE_OTHER): Payer: Medicare Other | Admitting: Internal Medicine

## 2020-05-17 ENCOUNTER — Other Ambulatory Visit: Payer: Self-pay

## 2020-05-17 VITALS — BP 122/70 | HR 77 | Temp 98.3°F | Ht 62.0 in

## 2020-05-17 DIAGNOSIS — F411 Generalized anxiety disorder: Secondary | ICD-10-CM

## 2020-05-17 DIAGNOSIS — R7302 Impaired glucose tolerance (oral): Secondary | ICD-10-CM | POA: Diagnosis not present

## 2020-05-17 DIAGNOSIS — R300 Dysuria: Secondary | ICD-10-CM

## 2020-05-17 MED ORDER — AMOXICILLIN-POT CLAVULANATE 875-125 MG PO TABS: 1.0000 | ORAL_TABLET | Freq: Two times a day (BID) | ORAL | 0 refills | Status: DC

## 2020-05-17 MED ORDER — FLUCONAZOLE 150 MG PO TABS: ORAL_TABLET | ORAL | 1 refills | Status: DC

## 2020-05-17 NOTE — Patient Instructions (Signed)
Your specimen is now pending at the lab  Please take all new medication as prescribed - the antibiotic and yeast infection medication  Please continue all other medications as before, and refills have been done if requested.  Please have the pharmacy call with any other refills you may need.  Please continue your efforts at being more active, low cholesterol diet, and weight control.  Please keep your appointments with your specialists as you may have planned

## 2020-05-17 NOTE — Progress Notes (Signed)
Subjective:    Patient ID: Jaime Young, female    DOB: 01/08/1951, 69 y.o.   MRN: [id number]  HPI  Here with c/o 3 days onset severe dysuria and frequency, lower abd pain but Denies urinary symptoms such as urgency, flank pain, hematuria or n/v, fever, chills. Pt denies chest pain, increased sob or doe, wheezing, orthopnea, PND, increased LE swelling, palpitations, dizziness or syncope.  Pt denies new neurological symptoms such as new headache, or facial or extremity weakness or numbness   Pt denies polydipsia, polyuria, Denies worsening depressive symptoms, suicidal ideation, or panic Past Medical History:  Diagnosis Date  . Allergic rhinitis   . Anxiety   . Cancer ([place])    stage I right breast cancer  . Diverticulitis   . Diverticulosis of colon   . DJD (degenerative joint disease) of lumbar spine   . DVT (deep venous thrombosis) ([place]) 1977  . 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 Hyperlipidemia   . Impaired glucose tolerance 11/17/2010  . Migraine   . Osteopenia 09/18/2010  . Post-operative nausea and vomiting   . RADIAL NERVE INJURY   . Raynaud's disease   . RAYNAUD'S DISEASE   . SUPERFICIAL PHLEBITIS 09/01/2007   Past Surgical History:  Procedure Laterality Date  . BACK SURGERY  2002  . BREAST LUMPECTOMY Right 2011   axillary node dissection  . BREAST SURGERY  1989/2001   implants  . [place]  . PARS PLANA VITRECTOMY Left 2013   w/Membranectomy  . TONSILLECTOMY    . TOTAL ABDOMINAL HYSTERECTOMY W/ BILATERAL SALPINGOOPHORECTOMY  1983   secondary to edometriosis    reports that she has quit smoking. She has never used smokeless tobacco. She reports that she does not drink alcohol and does not use drugs. family history includes Bone cancer in an other family member; Breast cancer in an other family member; Cancer in her father and sister; Esophageal cancer in her father; Heart disease in her father; Hypertension in her mother; Stomach cancer in her  sister and another family member; Stroke in her mother. Allergies  Allergen Reactions  . Dextromethorphan Swelling  . Ciprofloxacin Other (See Comments)    Pt denies  . Codeine Nausea Only  . Hydrocodone Nausea And Vomiting    Pt indicates N/V occurs with all narcotics.   . Penicillins Hives  . Sulfonamide Derivatives Nausea Only  . Aflibercept Rash   Current Outpatient Medications on File Prior to Visit  Medication Sig Dispense Refill  . brimonidine-timolol (COMBIGAN) 0.2-0.5 % ophthalmic solution Place 1 drop into the left eye 2 (two) times daily.     . Calcium-Magnesium-Vitamin D (CALCIUM 500) 500-250-200 MG-MG-UNIT TABS Take 2 tablets by mouth 2 (two) times daily.     . cyanocobalamin 500 MCG tablet Take 500 mcg by mouth daily.    . Multiple Vitamins tablet Take 1 tablet by mouth daily. Reported on 08/02/2015    . Omega-3 Fatty Acids (FISH OIL) 300 MG CAPS Take 1 capsule by mouth daily.    . simvastatin (ZOCOR) 10 MG tablet TAKE 1 TABLET BY MOUTH AT BEDTIME 90 tablet 0  . tamoxifen (NOLVADEX) 20 MG tablet Take 1 tablet (20 mg total) by mouth daily. 90 tablet 4  . vitamin C (ASCORBIC ACID) 500 MG tablet Take 1,000 mg by mouth daily.     No current facility-administered medications on file prior to visit.   Review of Systems All otherwise neg per pt  Objective:   Physical Exam BP 122/70 (BP Location: Left Arm, Patient Position: Sitting, Cuff Size: Large)   Pulse 77   Temp 98.3 F (36.8 C) (Oral)   Ht 5\' 2"  (1.575 m)   SpO2 98%   BMI 25.42 kg/m  VS noted,  Constitutional: Pt appears in NAD HENT: Head: NCAT.  Right Ear: External ear normal.  Left Ear: External ear normal.  Eyes: . Pupils are equal, round, and reactive to light. Conjunctivae and EOM are normal Nose: without d/c or deformity Neck: Neck supple. Gross normal ROM Cardiovascular: Normal rate and regular rhythm.   Pulmonary/Chest: Effort normal and breath sounds without rales or wheezing.  Abd:  Soft, mild  low mid abd tender, ND, + BS, no organomegaly Neurological: Pt is alert. At baseline orientation, motor grossly intact Skin: Skin is warm. No rashes, other new lesions, no LE edema Psychiatric: Pt behavior is normal without agitation  All otherwise neg per pt Lab Results  Component Value Date   WBC 4.8 02/27/2020   HGB 13.6 02/27/2020   HCT 41.5 02/27/2020   PLT 185 02/27/2020   GLUCOSE 80 02/27/2020   CHOL 182 09/08/2019   TRIG 98.0 09/08/2019   HDL 49.10 09/08/2019   LDLDIRECT 160.6 12/20/2007   LDLCALC 114 (H) 09/08/2019   ALT 19 02/27/2020   AST 34 02/27/2020   NA 140 02/27/2020   K 3.9 02/27/2020   CL 106 02/27/2020   CREATININE 0.80 02/27/2020   BUN 8 02/27/2020   CO2 29 02/27/2020   TSH 2.87 09/08/2019   INR 1.00 02/04/2010   HGBA1C 5.8 09/08/2019         Assessment & Plan:

## 2020-05-18 ENCOUNTER — Telehealth: Payer: Self-pay | Admitting: Internal Medicine

## 2020-05-18 LAB — URINALYSIS, ROUTINE W REFLEX MICROSCOPIC
Bilirubin Urine: NEGATIVE
Ketones, ur: NEGATIVE
Nitrite: NEGATIVE
Specific Gravity, Urine: 1.01 (ref 1.000–1.030)
Total Protein, Urine: NEGATIVE
Urine Glucose: NEGATIVE
Urobilinogen, UA: 0.2 (ref 0.0–1.0)
pH: 6 (ref 5.0–8.0)

## 2020-05-18 LAB — URINE CULTURE

## 2020-05-18 MED ORDER — NITROFURANTOIN MACROCRYSTAL 50 MG PO CAPS: 50.0000 mg | ORAL_CAPSULE | Freq: Four times a day (QID) | ORAL | 0 refills | Status: DC

## 2020-05-18 NOTE — Telephone Encounter (Signed)
Jaime Young with [facility] called and said that amoxicillin-clavulanate (AUGMENTIN) 875-125 MG  Was called inn but the patient is allergic penicillin.   It can be sent to [place], [place] - [address]

## 2020-05-18 NOTE — Telephone Encounter (Signed)
Sent to Dr. John. 

## 2020-05-18 NOTE — Telephone Encounter (Signed)
Ok this was not known as pt told us previously this was not the case  Ok to change to macrobid - I will send

## 2020-05-19 ENCOUNTER — Encounter: Payer: Self-pay | Admitting: Internal Medicine

## 2020-05-19 NOTE — Assessment & Plan Note (Signed)
stable overall by history and exam, recent data reviewed with pt, and pt to continue medical treatment as before,  to f/u any worsening symptoms or concerns Lab Results  Component Value Date   HGBA1C 5.8 09/08/2019

## 2020-05-19 NOTE — Assessment & Plan Note (Signed)
stable overall by history and exam, recent data reviewed with pt, and pt to continue medical treatment as before,  to f/u any worsening symptoms or concerns  

## 2020-05-19 NOTE — Assessment & Plan Note (Addendum)
Probable uti, for urine studies, for empiric antix,  to f/u any worsening symptoms or concerns  I spent 31 minutes in preparing to see the patient by review of recent labs, imaging and procedures, obtaining and reviewing separately obtained history, communicating with the patient and family or caregiver, ordering medications, tests or procedures, and documenting clinical information in the EHR including the differential Dx, treatment, and any further evaluation and other management of uti, hyperglycemia, anxiety

## 2020-05-24 ENCOUNTER — Other Ambulatory Visit: Payer: Self-pay | Admitting: Oncology

## 2020-05-24 DIAGNOSIS — C50411 Malignant neoplasm of upper-outer quadrant of right female breast: Secondary | ICD-10-CM

## 2020-05-31 DIAGNOSIS — H402222 Chronic angle-closure glaucoma, left eye, moderate stage: Secondary | ICD-10-CM | POA: Diagnosis not present

## 2020-06-14 ENCOUNTER — Ambulatory Visit (INDEPENDENT_AMBULATORY_CARE_PROVIDER_SITE_OTHER): Payer: Medicare Other

## 2020-06-14 ENCOUNTER — Other Ambulatory Visit: Payer: Self-pay

## 2020-06-14 DIAGNOSIS — Z23 Encounter for immunization: Secondary | ICD-10-CM | POA: Diagnosis not present

## 2020-07-09 ENCOUNTER — Other Ambulatory Visit: Payer: Self-pay | Admitting: Internal Medicine

## 2020-07-09 NOTE — Telephone Encounter (Signed)
Please refill as per office routine med refill policy (all routine meds refilled for 3 mo or monthly per pt preference up to one year from last visit, then month to month grace period for 3 mo, then further med refills will have to be denied)  

## 2020-09-10 ENCOUNTER — Other Ambulatory Visit: Payer: Self-pay

## 2020-09-11 ENCOUNTER — Ambulatory Visit: Payer: Medicare Other | Admitting: Internal Medicine

## 2020-09-11 ENCOUNTER — Encounter: Payer: Self-pay | Admitting: Internal Medicine

## 2020-09-11 ENCOUNTER — Ambulatory Visit (INDEPENDENT_AMBULATORY_CARE_PROVIDER_SITE_OTHER): Payer: Medicare Other

## 2020-09-11 ENCOUNTER — Other Ambulatory Visit: Payer: Self-pay | Admitting: Internal Medicine

## 2020-09-11 ENCOUNTER — Ambulatory Visit (INDEPENDENT_AMBULATORY_CARE_PROVIDER_SITE_OTHER): Payer: Medicare Other | Admitting: Internal Medicine

## 2020-09-11 VITALS — BP 116/70 | HR 88 | Temp 97.6°F | Ht 62.0 in | Wt 130.4 lb

## 2020-09-11 DIAGNOSIS — F411 Generalized anxiety disorder: Secondary | ICD-10-CM | POA: Diagnosis not present

## 2020-09-11 DIAGNOSIS — E785 Hyperlipidemia, unspecified: Secondary | ICD-10-CM

## 2020-09-11 DIAGNOSIS — E538 Deficiency of other specified B group vitamins: Secondary | ICD-10-CM | POA: Diagnosis not present

## 2020-09-11 DIAGNOSIS — E559 Vitamin D deficiency, unspecified: Secondary | ICD-10-CM | POA: Diagnosis not present

## 2020-09-11 DIAGNOSIS — Z0000 Encounter for general adult medical examination without abnormal findings: Secondary | ICD-10-CM | POA: Diagnosis not present

## 2020-09-11 DIAGNOSIS — R7302 Impaired glucose tolerance (oral): Secondary | ICD-10-CM | POA: Diagnosis not present

## 2020-09-11 LAB — CBC WITH DIFFERENTIAL/PLATELET
Basophils Absolute: 0 10*3/uL (ref 0.0–0.1)
Basophils Relative: 0.7 % (ref 0.0–3.0)
Eosinophils Absolute: 0.1 10*3/uL (ref 0.0–0.7)
Eosinophils Relative: 2 % (ref 0.0–5.0)
HCT: 43.4 % (ref 36.0–46.0)
Hemoglobin: 14.6 g/dL (ref 12.0–15.0)
Lymphocytes Relative: 24.2 % (ref 12.0–46.0)
Lymphs Abs: 1 10*3/uL (ref 0.7–4.0)
MCHC: 33.7 g/dL (ref 30.0–36.0)
MCV: 92 fl (ref 78.0–100.0)
Monocytes Absolute: 0.3 10*3/uL (ref 0.1–1.0)
Monocytes Relative: 6.4 % (ref 3.0–12.0)
Neutro Abs: 2.8 10*3/uL (ref 1.4–7.7)
Neutrophils Relative %: 66.7 % (ref 43.0–77.0)
Platelets: 243 10*3/uL (ref 150.0–400.0)
RBC: 4.71 Mil/uL (ref 3.87–5.11)
RDW: 13.1 % (ref 11.5–15.5)
WBC: 4.3 10*3/uL (ref 4.0–10.5)

## 2020-09-11 LAB — BASIC METABOLIC PANEL
BUN: 12 mg/dL (ref 6–23)
CO2: 27 mEq/L (ref 19–32)
Calcium: 9.2 mg/dL (ref 8.4–10.5)
Chloride: 105 mEq/L (ref 96–112)
Creatinine, Ser: 0.82 mg/dL (ref 0.40–1.20)
GFR: 72.31 mL/min (ref >60.00)
Glucose, Bld: 82 mg/dL (ref 70–99)
Potassium: 3.5 mEq/L (ref 3.5–5.1)
Sodium: 143 mEq/L (ref 135–145)

## 2020-09-11 LAB — URINALYSIS, ROUTINE W REFLEX MICROSCOPIC
Bilirubin Urine: NEGATIVE
Hgb urine dipstick: NEGATIVE
Ketones, ur: 15 — AB
Leukocytes,Ua: NEGATIVE
Nitrite: NEGATIVE
RBC / HPF: NONE SEEN (ref 0–?)
Specific Gravity, Urine: 1.015 (ref 1.000–1.030)
Total Protein, Urine: NEGATIVE
Urine Glucose: NEGATIVE
Urobilinogen, UA: 0.2 (ref 0.0–1.0)
pH: 5.5 (ref 5.0–8.0)

## 2020-09-11 LAB — HEPATIC FUNCTION PANEL
ALT: 10 U/L (ref 0–35)
AST: 21 U/L (ref 0–37)
Albumin: 4.5 g/dL (ref 3.5–5.2)
Alkaline Phosphatase: 35 U/L — ABNORMAL LOW (ref 39–117)
Bilirubin, Direct: 0.1 mg/dL (ref 0.0–0.3)
Total Bilirubin: 0.3 mg/dL (ref 0.2–1.2)
Total Protein: 7 g/dL (ref 6.0–8.3)

## 2020-09-11 LAB — LIPID PANEL
Cholesterol: 184 mg/dL (ref 0–200)
HDL: 43.5 mg/dL (ref >39.00)
LDL Cholesterol: 120 mg/dL — ABNORMAL HIGH (ref 0–99)
NonHDL: 140.96
Total CHOL/HDL Ratio: 4
Triglycerides: 103 mg/dL (ref 0.0–149.0)
VLDL: 20.6 mg/dL (ref 0.0–40.0)

## 2020-09-11 LAB — HEMOGLOBIN A1C: Hgb A1c MFr Bld: 6 % (ref 4.6–6.5)

## 2020-09-11 LAB — VITAMIN D 25 HYDROXY (VIT D DEFICIENCY, FRACTURES): VITD: 64.66 ng/mL (ref 30.00–100.00)

## 2020-09-11 LAB — VITAMIN B12: Vitamin B-12: 808 pg/mL (ref 211–911)

## 2020-09-11 LAB — TSH: TSH: 1.41 u[IU]/mL (ref 0.35–4.50)

## 2020-09-11 MED ORDER — SIMVASTATIN 20 MG PO TABS: 20.0000 mg | ORAL_TABLET | Freq: Every day | ORAL | 3 refills | Status: DC

## 2020-09-11 NOTE — Progress Notes (Signed)
Patient ID: Jaime Young, female   DOB: 05-14-1951, 70 y.o.   MRN: [id number]         Chief Complaint:: yearly exam       HPI:  Jaime Young is a 70 y.o. female here for above; overall doing ok, and up to date with preventive referrals and immunizations.                      Has some drooping upper eyelid on lef and plans to see further eye care for possible procedure.  Pt denies chest pain, increased sob or doe, wheezing, orthopnea, PND, increased LE swelling, palpitations, dizziness or syncope.  Denies new focal neuro s/s.   Pt denies polydipsia, polyuria,  Pt denies fever, wt loss, night sweats, loss of appetite, or other constitutional symptoms.  Denies worsening depressive symptoms, suicidal ideation, or panic  Wt Readings from Last 3 Encounters:  09/11/20 130 lb 6.4 oz (59.1 kg)  09/11/20 130 lb 6.4 oz (59.1 kg)  02/27/20 139 lb (63 kg)   BP Readings from Last 3 Encounters:  09/11/20 116/70  09/11/20 116/70  05/17/20 122/70   Immunization History  Administered Date(s) Administered  . Fluad Quad(high Dose 65+) 03/04/2019, 06/14/2020  . Hepatitis A, Adult 05/24/2018  . Influenza, High Dose Seasonal PF 04/18/2016  . PFIZER(Purple Top)SARS-COV-2 Vaccination 08/06/2019, 08/29/2019, 04/14/2020  . Pneumococcal Conjugate-13 08/25/2016  . Pneumococcal Polysaccharide-23 08/27/2017  . Zoster 07/12/2012   There are no preventive care reminders to display for this patient.    Past Medical History:  Diagnosis Date  . Allergic rhinitis   . Anxiety   . Cancer ([place])    stage I right breast cancer  . Diverticulitis   . Diverticulosis of colon   . DJD (degenerative joint disease) of lumbar spine   . DVT (deep venous thrombosis) ([place]) 1977  . 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 Hyperlipidemia   . Impaired glucose tolerance 11/17/2010  . Migraine   . Osteopenia 09/18/2010  . Post-operative nausea and vomiting   . RADIAL NERVE INJURY   . Raynaud's disease   . RAYNAUD'S  DISEASE   . SUPERFICIAL PHLEBITIS 09/01/2007   Past Surgical History:  Procedure Laterality Date  . BACK SURGERY  2002  . BREAST LUMPECTOMY Right 2011   axillary node dissection  . BREAST SURGERY  1989/2001   implants  . [place]  . PARS PLANA VITRECTOMY Left 2013   w/Membranectomy  . TONSILLECTOMY    . TOTAL ABDOMINAL HYSTERECTOMY W/ BILATERAL SALPINGOOPHORECTOMY  1983   secondary to edometriosis    reports that she has quit smoking. She has never used smokeless tobacco. She reports that she does not drink alcohol and does not use drugs. family history includes Bone cancer in an other family member; Breast cancer in an other family member; Cancer in her father and sister; Esophageal cancer in her father; Heart disease in her father; Hypertension in her mother; Stomach cancer in her sister and another family member; Stroke in her mother. Allergies  Allergen Reactions  . Dextromethorphan Swelling  . Ciprofloxacin Other (See Comments)    Pt denies  . Codeine Nausea Only  . Hydrocodone Nausea And Vomiting    Pt indicates N/V occurs with all narcotics.   . Penicillins Hives  . Sulfonamide Derivatives Nausea Only  . Aflibercept Rash   Current Outpatient Medications on File Prior to Visit  Medication Sig Dispense Refill  .  brimonidine-timolol (COMBIGAN) 0.2-0.5 % ophthalmic solution Place 1 drop into the left eye 2 (two) times daily.     . Calcium-Magnesium-Vitamin D [phone] MG-MG-UNIT TABS Take 2 tablets by mouth 2 (two) times daily.     . cyanocobalamin 500 MCG tablet Take 500 mcg by mouth daily.    . fluconazole (DIFLUCAN) 150 MG tablet 1 tab by mouth every 3 days as needed 2 tablet 1  . Multiple Vitamins tablet Take 1 tablet by mouth daily. Reported on 08/02/2015    . Omega-3 Fatty Acids (FISH OIL) 300 MG CAPS Take 1 capsule by mouth daily.    . tamoxifen (NOLVADEX) 20 MG tablet Take 1 tablet by mouth once daily 60 tablet 1  . vitamin C (ASCORBIC ACID) 500 MG  tablet Take 1,000 mg by mouth daily.     No current facility-administered medications on file prior to visit.        ROS:  All others reviewed and negative.  Objective        PE:  BP 116/70 (BP Location: Left Arm, Patient Position: Sitting, Cuff Size: Large)   Pulse 88   Temp 97.6 F (36.4 C) (Oral)   Ht 5\' 2"  (1.575 m)   Wt 130 lb 6.4 oz (59.1 kg)   SpO2 96%   BMI 23.85 kg/m                 Constitutional: Pt appears in NAD               HENT: Head: NCAT.                Right Ear: External ear normal.                 Left Ear: External ear normal.                Eyes: . Pupils are equal, round, and reactive to light. Conjunctivae and EOM are normal               Nose: without d/c or deformity               Neck: Neck supple. Gross normal ROM               Cardiovascular: Normal rate and regular rhythm.                 Pulmonary/Chest: Effort normal and breath sounds without rales or wheezing.                Abd:  Soft, NT, ND, + BS, no organomegaly               Neurological: Pt is alert. At baseline orientation, motor grossly intact               Skin: Skin is warm. No rashes, no other new lesions, LE edema - none               Psychiatric: Pt behavior is normal without agitation   Micro: none  Cardiac tracings I have personally interpreted today:  none  Pertinent Radiological findings (summarize): none   Lab Results  Component Value Date   WBC 4.3 09/11/2020   HGB 14.6 09/11/2020   HCT 43.4 09/11/2020   PLT 243.0 09/11/2020   GLUCOSE 82 09/11/2020   CHOL 184 09/11/2020   TRIG 103.0 09/11/2020   HDL 43.50 09/11/2020   LDLDIRECT 160.6 12/20/2007   LDLCALC 120 (H) 09/11/2020   ALT 10  09/11/2020   AST 21 09/11/2020   NA 143 09/11/2020   K 3.5 09/11/2020   CL 105 09/11/2020   CREATININE 0.82 09/11/2020   BUN 12 09/11/2020   CO2 27 09/11/2020   TSH 1.41 09/11/2020   INR 1.00 02/04/2010   HGBA1C 6.0 09/11/2020   Assessment/Plan:  RANISHA ALLAIRE is a 70  y.o. White or Caucasian [1] female with  has a past medical history of Allergic rhinitis, Anxiety, Cancer ([place]), Diverticulitis, Diverticulosis of colon, DJD (degenerative joint disease) of lumbar spine, DVT (deep venous thrombosis) ([place]) (1977), Hearing loss of both ears, Hyperlipidemia, Impaired glucose tolerance (11/17/2010), Migraine, Osteopenia (09/18/2010), Post-operative nausea and vomiting, RADIAL NERVE INJURY, Raynaud's disease, RAYNAUD'S DISEASE, and SUPERFICIAL PHLEBITIS (09/01/2007).  Impaired glucose tolerance Lab Results  Component Value Date   HGBA1C 6.0 09/11/2020   Stable, pt to continue current medical treatment  - diet   Hyperlipidemia Lab Results  Component Value Date   LDLCALC 120 (H) 09/11/2020   Uncontrolled, to increase current statin - zocor 20   Anxiety state Stable overall, declines need for new tx   Followup: Return in about 1 year (around 09/11/2021).  Cathlean Cower, MD 09/18/2020 9:38 PM [facility]

## 2020-09-11 NOTE — Patient Instructions (Addendum)
Jaime Young , Thank you for taking time to come for your Medicare Wellness Visit. I appreciate your ongoing commitment to your health goals. Please review the following plan we discussed and let me know if I can assist you in the future.   Screening recommendations/referrals: Colonoscopy: 02/01/2014; due every 5 years Mammogram: 11/25/2019; due every 1-2 years Bone Density: 09/11/2010; completed Recommended yearly ophthalmology/optometry visit for glaucoma screening and checkup Recommended yearly dental visit for hygiene and checkup  Vaccinations: Influenza vaccine: 06/14/2020 Pneumococcal vaccine: 08/25/2016, 08/27/2017 Tdap vaccine: no record Shingles vaccine: never done; can check with local pharmacy   Covid-19: 08/06/2019, 08/29/2019, 04/14/2020  Advanced directives: Please bring a copy of your health care power of attorney and living will to the office at your convenience.  Conditions/risks identified: Yes; Reviewed health maintenance screenings with patient today and relevant education, vaccines, and/or referrals were provided. Please continue to do your personal lifestyle choices by: daily care of teeth and gums, regular physical activity (goal should be 5 days a week for 30 minutes), eat a healthy diet, avoid tobacco and drug use, limiting any alcohol intake, taking a low-dose aspirin (if not allergic or have been advised by your provider otherwise) and taking vitamins and minerals as recommended by your provider. Continue doing brain stimulating activities (puzzles, reading, adult coloring books, staying active) to keep memory sharp. Continue to eat heart healthy diet (full of fruits, vegetables, whole grains, lean protein, water--limit salt, fat, and sugar intake) and increase physical activity as tolerated.  Next appointment: Please schedule your next Medicare Wellness Visit with your Nurse Health Advisor in 1 year by calling [phone].   Preventive Care 71 Years and Older,  Female Preventive care refers to lifestyle choices and visits with your health care provider that can promote health and wellness. What does preventive care include?  A yearly physical exam. This is also called an annual well check.  Dental exams once or twice a year.  Routine eye exams. Ask your health care provider how often you should have your eyes checked.  Personal lifestyle choices, including:  Daily care of your teeth and gums.  Regular physical activity.  Eating a healthy diet.  Avoiding tobacco and drug use.  Limiting alcohol use.  Practicing safe sex.  Taking low-dose aspirin every day.  Taking vitamin and mineral supplements as recommended by your health care provider. What happens during an annual well check? The services and screenings done by your health care provider during your annual well check will depend on your age, overall health, lifestyle risk factors, and family history of disease. Counseling  Your health care provider may ask you questions about your:  Alcohol use.  Tobacco use.  Drug use.  Emotional well-being.  Home and relationship well-being.  Sexual activity.  Eating habits.  History of falls.  Memory and ability to understand (cognition).  Work and work Statistician.  Reproductive health. Screening  You may have the following tests or measurements:  Height, weight, and BMI.  Blood pressure.  Lipid and cholesterol levels. These may be checked every 5 years, or more frequently if you are over 24 years old.  Skin check.  Lung cancer screening. You may have this screening every year starting at age 71 if you have a 30-pack-year history of smoking and currently smoke or have quit within the past 15 years.  Fecal occult blood test (FOBT) of the stool. You may have this test every year starting at age 25.  Flexible sigmoidoscopy or colonoscopy. You  may have a sigmoidoscopy every 5 years or a colonoscopy every 10 years  starting at age 65.  Hepatitis C blood test.  Hepatitis B blood test.  Sexually transmitted disease (STD) testing.  Diabetes screening. This is done by checking your blood sugar (glucose) after you have not eaten for a while (fasting). You may have this done every 1-3 years.  Bone density scan. This is done to screen for osteoporosis. You may have this done starting at age 24.  Mammogram. This may be done every 1-2 years. Talk to your health care provider about how often you should have regular mammograms. Talk with your health care provider about your test results, treatment options, and if necessary, the need for more tests. Vaccines  Your health care provider may recommend certain vaccines, such as:  Influenza vaccine. This is recommended every year.  Tetanus, diphtheria, and acellular pertussis (Tdap, Td) vaccine. You may need a Td booster every 10 years.  Zoster vaccine. You may need this after age 91.  Pneumococcal 13-valent conjugate (PCV13) vaccine. One dose is recommended after age 52.  Pneumococcal polysaccharide (PPSV23) vaccine. One dose is recommended after age 30. Talk to your health care provider about which screenings and vaccines you need and how often you need them. This information is not intended to replace advice given to you by your health care provider. Make sure you discuss any questions you have with your health care provider. Document Released: 06/29/2015 Document Revised: 02/20/2016 Document Reviewed: 04/03/2015 Elsevier Interactive Patient Education  2017 [place] Prevention in the Home Falls can cause injuries. They can happen to people of all ages. There are many things you can do to make your home safe and to help prevent falls. What can I do on the outside of my home?  Regularly fix the edges of walkways and driveways and fix any cracks.  Remove anything that might make you trip as you walk through a door, such as a raised step or  threshold.  Trim any bushes or trees on the path to your home.  Use bright outdoor lighting.  Clear any walking paths of anything that might make someone trip, such as rocks or tools.  Regularly check to see if handrails are loose or broken. Make sure that both sides of any steps have handrails.  Any raised decks and porches should have guardrails on the edges.  Have any leaves, snow, or ice cleared regularly.  Use sand or salt on walking paths during winter.  Clean up any spills in your garage right away. This includes oil or grease spills. What can I do in the bathroom?  Use night lights.  Install grab bars by the toilet and in the tub and shower. Do not use towel bars as grab bars.  Use non-skid mats or decals in the tub or shower.  If you need to sit down in the shower, use a plastic, non-slip stool.  Keep the floor dry. Clean up any water that spills on the floor as soon as it happens.  Remove soap buildup in the tub or shower regularly.  Attach bath mats securely with double-sided non-slip rug tape.  Do not have throw rugs and other things on the floor that can make you trip. What can I do in the bedroom?  Use night lights.  Make sure that you have a light by your bed that is easy to reach.  Do not use any sheets or blankets that are too big for  your bed. They should not hang down onto the floor.  Have a firm chair that has side arms. You can use this for support while you get dressed.  Do not have throw rugs and other things on the floor that can make you trip. What can I do in the kitchen?  Clean up any spills right away.  Avoid walking on wet floors.  Keep items that you use a lot in easy-to-reach places.  If you need to reach something above you, use a strong step stool that has a grab bar.  Keep electrical cords out of the way.  Do not use floor polish or wax that makes floors slippery. If you must use wax, use non-skid floor wax.  Do not have  throw rugs and other things on the floor that can make you trip. What can I do with my stairs?  Do not leave any items on the stairs.  Make sure that there are handrails on both sides of the stairs and use them. Fix handrails that are broken or loose. Make sure that handrails are as long as the stairways.  Check any carpeting to make sure that it is firmly attached to the stairs. Fix any carpet that is loose or worn.  Avoid having throw rugs at the top or bottom of the stairs. If you do have throw rugs, attach them to the floor with carpet tape.  Make sure that you have a light switch at the top of the stairs and the bottom of the stairs. If you do not have them, ask someone to add them for you. What else can I do to help prevent falls?  Wear shoes that:  Do not have high heels.  Have rubber bottoms.  Are comfortable and fit you well.  Are closed at the toe. Do not wear sandals.  If you use a stepladder:  Make sure that it is fully opened. Do not climb a closed stepladder.  Make sure that both sides of the stepladder are locked into place.  Ask someone to hold it for you, if possible.  Clearly mark and make sure that you can see:  Any grab bars or handrails.  First and last steps.  Where the edge of each step is.  Use tools that help you move around (mobility aids) if they are needed. These include:  Canes.  Walkers.  Scooters.  Crutches.  Turn on the lights when you go into a dark area. Replace any light bulbs as soon as they burn out.  Set up your furniture so you have a clear path. Avoid moving your furniture around.  If any of your floors are uneven, fix them.  If there are any pets around you, be aware of where they are.  Review your medicines with your doctor. Some medicines can make you feel dizzy. This can increase your chance of falling. Ask your doctor what other things that you can do to help prevent falls. This information is not intended to  replace advice given to you by your health care provider. Make sure you discuss any questions you have with your health care provider. Document Released: 03/29/2009 Document Revised: 11/08/2015 Document Reviewed: 07/07/2014 Elsevier Interactive Patient Education  2017 [employer].

## 2020-09-11 NOTE — Patient Instructions (Signed)
Please continue all other medications as before, and refills have been done if requested.  Please have the pharmacy call with any other refills you may need.  Please continue your efforts at being more active, low cholesterol diet, and weight control.  You are otherwise up to date with prevention measures today.  Please keep your appointments with your specialists as you may have planned  .Please go to the LAB at the blood drawing area for the tests to be done  You will be contacted by phone if any changes need to be made immediately.  Otherwise, you will receive a letter about your results with an explanation, but please check with MyChart first.  Please remember to sign up for MyChart if you have not done so, as this will be important to you in the future with finding out test results, communicating by private email, and scheduling acute appointments online when needed.  Please make an Appointment to return for your 1 year visit

## 2020-09-11 NOTE — Progress Notes (Signed)
Subjective:   Jaime Young is a 70 y.o. female who presents for Medicare Annual (Subsequent) preventive examination.  Review of Systems    No ROS. Medicare Wellness Visit. Additional risk factors are reflected in social history. Cardiac Risk Factors include: advanced age (>49men, >85 women);dyslipidemia;family history of premature cardiovascular disease  Sleep Patterns: No sleep issues, feels rested on waking and sleeps 8 hours nightly. Home Safety/Smoke Alarms: Feels safe in home; uses home alarm. Smoke alarms in place. Living environment: 1-story home; Lives with spouse; no needs for DME; good support system. Seat Belt Safety/Bike Helmet: Wears seat belt.    Objective:    Today's Vitals   09/11/20 0808  BP: 116/70  Pulse: 88  Temp: 97.6 F (36.4 C)  SpO2: 96%  Weight: 130 lb 6.4 oz (59.1 kg)  Height: 5\' 2"  (1.575 m)  PainSc: 0-No pain   Body mass index is 23.85 kg/m.  Advanced Directives 09/11/2020 09/07/2018 08/27/2017 03/23/2017 08/20/2016 09/25/2015 08/02/2015  Does Patient Have a Medical Advance Directive? Yes Yes No No No No No  Type of Advance Directive - Healthcare Power of [place];Living will - - - - -  Does patient want to make changes to medical advance directive? Yes (MAU/Ambulatory/Procedural Areas - Information given) - Yes (ED - Information included in AVS) - - - -  Copy of Healthcare Power of Attorney in Chart? - No - copy requested - - - - -  Would patient like information on creating a medical advance directive? - - - - - - No - patient declined information    Current Medications (verified) Outpatient Encounter Medications as of 09/11/2020  Medication Sig  . brimonidine-timolol (COMBIGAN) 0.2-0.5 % ophthalmic solution Place 1 drop into the left eye 2 (two) times daily.   . Calcium-Magnesium-Vitamin D [phone] MG-MG-UNIT TABS Take 2 tablets by mouth 2 (two) times daily.   . cyanocobalamin 500 MCG tablet Take 500 mcg by mouth daily.  . fluconazole  (DIFLUCAN) 150 MG tablet 1 tab by mouth every 3 days as needed  . Multiple Vitamins tablet Take 1 tablet by mouth daily. Reported on 08/02/2015  . Omega-3 Fatty Acids (FISH OIL) 300 MG CAPS Take 1 capsule by mouth daily.  . simvastatin (ZOCOR) 10 MG tablet TAKE 1 TABLET BY MOUTH AT BEDTIME  . tamoxifen (NOLVADEX) 20 MG tablet Take 1 tablet by mouth once daily  . vitamin C (ASCORBIC ACID) 500 MG tablet Take 1,000 mg by mouth daily.  . [DISCONTINUED] nitrofurantoin (MACRODANTIN) 50 MG capsule Take 1 capsule (50 mg total) by mouth 4 (four) times daily. (Patient not taking: Reported on 09/11/2020)   No facility-administered encounter medications on file as of 09/11/2020.    Allergies (verified) Dextromethorphan, Ciprofloxacin, Codeine, Hydrocodone, Penicillins, Sulfonamide derivatives, and Aflibercept   History: Past Medical History:  Diagnosis Date  . Allergic rhinitis   . Anxiety   . Cancer ([place])    stage I right breast cancer  . Diverticulitis   . Diverticulosis of colon   . DJD (degenerative joint disease) of lumbar spine   . DVT (deep venous thrombosis) ([place]) 1977  . 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 Hyperlipidemia   . Impaired glucose tolerance 11/17/2010  . Migraine   . Osteopenia 09/18/2010  . Post-operative nausea and vomiting   . RADIAL NERVE INJURY   . Raynaud's disease   . RAYNAUD'S DISEASE   . SUPERFICIAL PHLEBITIS 09/01/2007   Past Surgical History:  Procedure Laterality Date  .  BACK SURGERY  2002  . BREAST LUMPECTOMY Right 2011   axillary node dissection  . BREAST SURGERY  1989/2001   implants  . [place]  . PARS PLANA VITRECTOMY Left 2013   w/Membranectomy  . TONSILLECTOMY    . TOTAL ABDOMINAL HYSTERECTOMY W/ BILATERAL SALPINGOOPHORECTOMY  1983   secondary to edometriosis   Family History  Problem Relation Age of Onset  . Stroke Mother   . Hypertension Mother   . Heart disease Father   . Cancer Father   . Esophageal cancer  Father   . Cancer Sister   . Stomach cancer Sister   . Breast cancer Other   . Bone cancer Other   . Stomach cancer Other   . Colon cancer Neg Hx   . Rectal cancer Neg Hx    Social History   Socioeconomic History  . Marital status: Married    Spouse name: Not on file  . Number of children: 1  . Years of education: Not on file  . Highest education level: Not on file  Occupational History  . Occupation: Audiological scientist: [employer]  . Occupation: Herbalist  Tobacco Use  . Smoking status: Former Research scientist (life sciences)  . Smokeless tobacco: Never Used  Vaping Use  . Vaping Use: Never used  Substance and Sexual Activity  . Alcohol use: No  . Drug use: No  . Sexual activity: Yes  Other Topics Concern  . Not on file  Social History Narrative  . Not on file   Social Determinants of Health   Financial Resource Strain: Low Risk   . Difficulty of Paying Living Expenses: Not hard at all  Food Insecurity: No Food Insecurity  . Worried About Charity fundraiser in the Last Year: Never true  . Ran Out of Food in the Last Year: Never true  Transportation Needs: No Transportation Needs  . Lack of Transportation (Medical): No  . Lack of Transportation (Non-Medical): No  Physical Activity: Sufficiently Active  . Days of Exercise per Week: 5 days  . Minutes of Exercise per Session: 30 min  Stress: No Stress Concern Present  . Feeling of Stress : Not at all  Social Connections: Socially Integrated  . Frequency of Communication with Friends and Family: More than three times a week  . Frequency of Social Gatherings with Friends and Family: More than three times a week  . Attends Religious Services: More than 4 times per year  . Active Member of Clubs or Organizations: No  . Attends Archivist Meetings: More than 4 times per year  . Marital Status: Married    Tobacco Counseling Counseling given: Not Answered   Clinical Intake:  Pre-visit preparation completed:  Yes  Pain : No/denies pain Pain Score: 0-No pain     BMI - recorded: 23.85 Nutritional Status: BMI of 19-24  Normal Nutritional Risks: None Diabetes: No  How often do you need to have someone help you when you read instructions, pamphlets, or other written materials from your doctor or pharmacy?: 1 - Never What is the last grade level you completed in school?: High School Graduate  Diabetic? no  Interpreter Needed?: No  Information entered by :: Lisette Abu, LPN   Activities of Daily Living In your present state of health, do you have any difficulty performing the following activities: 09/11/2020 05/17/2020  Hearing? N N  Comment wears hearing aids -  Vision? N N  Difficulty concentrating or making decisions?  N N  Walking or climbing stairs? N N  Dressing or bathing? N N  Doing errands, shopping? N N  Preparing Food and eating ? N -  Using the Toilet? N -  In the past six months, have you accidently leaked urine? N -  Do you have problems with loss of bowel control? N -  Managing your Medications? N -  Managing your Finances? N -  Housekeeping or managing your Housekeeping? N -  Some recent data might be hidden    Patient Care Team: Biagio Borg, MD as PCP - General Isaiah Blakes [name], MD (Radiology) Magrinat, [name], MD as Consulting Physician (Oncology)  Indicate any recent Medical Services you may have received from other than [facility] providers in the past year (date may be approximate).     Assessment:   This is a routine wellness examination for [place].  Hearing/Vision screen No exam data present  Dietary issues and exercise activities discussed: Current Exercise Habits: Home exercise routine, Type of exercise: walking;treadmill, Time (Minutes): 30, Frequency (Times/Week): 5, Weekly Exercise (Minutes/Week): 150, Intensity: Moderate, Exercise limited by: None identified  Goals    . 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 Continue exercise, eat healthy, enjoy life, family and worship God.    . Patient Stated     I want to lose weight by increasing my physical activity, using portion control, and monitoring carbohydrates.    . Patient Stated     I would like to lose weight and get to my ideal weight goal of 122 pounds.    . Weight (lb) < 200 lb (90.7 kg)      Depression Screen PHQ 2/9 Scores 09/11/2020 05/17/2020 09/08/2019 09/07/2018 08/27/2017 03/27/2017 08/20/2016  PHQ - 2 Score 0 0 0 0 0 0 0  PHQ- 9 Score - - - - 0 - -    Fall Risk Fall Risk  09/11/2020 05/17/2020 09/08/2019 09/07/2018 08/27/2017  Falls in the past year? 0 0 0 0 No  Number falls in past yr: 0 0 - - -  Injury with Fall? 0 0 - - -  Risk for fall due to : No Fall Risks No Fall Risks - - -  Follow up Falls evaluation completed Falls evaluation completed - - -    FALL RISK PREVENTION PERTAINING TO THE HOME:  Any stairs in or around the home? No  If so, are there any without handrails? No  Home free of loose throw rugs in walkways, pet beds, electrical cords, etc? Yes  Adequate lighting in your home to reduce risk of falls? Yes   ASSISTIVE DEVICES UTILIZED TO PREVENT FALLS:  Life alert? No  Use of a cane, walker or w/c? No  Grab bars in the bathroom? Yes  Shower chair or bench in shower? Yes  Elevated toilet seat or a handicapped toilet? Yes   TIMED UP AND GO:  Was the test performed? No .  Length of time to ambulate 10 feet: 0 sec.   Gait steady and fast without use of assistive device  Cognitive Function: Normal cognitive status assessed by direct observation by this Nurse Health Advisor. No abnormalities found.          Immunizations Immunization History  Administered Date(s) Administered  . Fluad Quad(high Dose 65+) 03/04/2019, 06/14/2020  . Hepatitis A, Adult 05/24/2018  . Influenza, High Dose Seasonal PF 04/18/2016  . PFIZER(Purple Top)SARS-COV-2 Vaccination 08/06/2019, 08/29/2019, 04/14/2020   .  Pneumococcal Conjugate-13 08/25/2016  . Pneumococcal Polysaccharide-23 08/27/2017  . Zoster 07/12/2012    TDAP status: Due, Education has been provided regarding the importance of this vaccine. Advised may receive this vaccine at local pharmacy or Health Dept. Aware to provide a copy of the vaccination record if obtained from local pharmacy or Health Dept. Verbalized acceptance and understanding.  Flu Vaccine status: Up to date  Pneumococcal vaccine status: Up to date  Covid-19 vaccine status: Completed vaccines  Qualifies for Shingles Vaccine? Yes   Zostavax completed Yes   Shingrix Completed?: No.    Education has been provided regarding the importance of this vaccine. Patient has been advised to call insurance company to determine out of pocket expense if they have not yet received this vaccine. Advised may also receive vaccine at local pharmacy or Health Dept. Verbalized acceptance and understanding.  Screening Tests Health Maintenance  Topic Date Due  . COLONOSCOPY (Pts 45-22yrs Insurance coverage will need to be confirmed)  02/02/2019  . COVID-19 Vaccine (4 - Booster for Pfizer series) 10/13/2020  . MAMMOGRAM  11/24/2021  . TETANUS/TDAP  06/16/2024  . INFLUENZA VACCINE  Completed  . DEXA SCAN  Completed  . Hepatitis C Screening  Completed  . PNA vac Low Risk Adult  Completed  . HPV VACCINES  Aged Out    Health Maintenance  Health Maintenance Due  Topic Date Due  . COLONOSCOPY (Pts 45-22yrs Insurance coverage will need to be confirmed)  02/02/2019    Colorectal cancer screening: Type of screening: Colonoscopy. Completed 02/01/2014. Repeat every 5 years  Mammogram status: Completed 11/25/2019. Repeat every year  Bone Density status: Completed 09/11/2010. Results reflect: Bone density results: OSTEOPENIA. Repeat every 0 years.  (completed)  Lung Cancer Screening: (Low Dose CT Chest recommended if Age 76-80 years, 30 pack-year currently smoking OR have quit w/in  15years.) does qualify.   Lung Cancer Screening Referral: no  Additional Screening:  Hepatitis C Screening: does qualify; Completed yes  Vision Screening: Recommended annual ophthalmology exams for early detection of glaucoma and other disorders of the eye. Is the patient up to date with their annual eye exam?  Yes  Who is the provider or what is the name of the office in which the patient attends annual eye exams? Stacey Hutto, OD. If pt is not established with a provider, would they like to be referred to a provider to establish care? No .   Dental Screening: Recommended annual dental exams for proper oral hygiene  Community Resource Referral / Chronic Care Management: CRR required this visit?  No   CCM required this visit?  No      Plan:     I have personally reviewed and noted the following in the patient's chart:   . Medical and social history . Use of alcohol, tobacco or illicit drugs  . Current medications and supplements . Functional ability and status . Nutritional status . Physical activity . Advanced directives . List of other physicians . Hospitalizations, surgeries, and ER visits in previous 12 months . Vitals . Screenings to include cognitive, depression, and falls . Referrals and appointments  In addition, I have reviewed and discussed with patient certain preventive protocols, quality metrics, and best practice recommendations. A written personalized care plan for preventive services as well as general preventive health recommendations were provided to patient.     Sheral Flow, LPN   [id number]/95/[id number]   Nurse Notes:  Medications reviewed with patient; no opioid use noted.

## 2020-09-18 ENCOUNTER — Encounter: Payer: Self-pay | Admitting: Internal Medicine

## 2020-09-18 NOTE — Assessment & Plan Note (Addendum)
Lab Results  Component Value Date   LDLCALC 120 (H) 09/11/2020   Uncontrolled, to increase current statin - zocor 20

## 2020-09-18 NOTE — Assessment & Plan Note (Signed)
Stable overall, declines need for new tx

## 2020-09-18 NOTE — Assessment & Plan Note (Signed)
Lab Results  Component Value Date   HGBA1C 6.0 09/11/2020   Stable, pt to continue current medical treatment  - diet

## 2020-09-19 ENCOUNTER — Other Ambulatory Visit: Payer: Self-pay | Admitting: Oncology

## 2020-09-19 DIAGNOSIS — Z170 Estrogen receptor positive status [ER+]: Secondary | ICD-10-CM

## 2020-09-19 DIAGNOSIS — C50411 Malignant neoplasm of upper-outer quadrant of right female breast: Secondary | ICD-10-CM

## 2020-10-09 DIAGNOSIS — H02403 Unspecified ptosis of bilateral eyelids: Secondary | ICD-10-CM | POA: Diagnosis not present

## 2020-10-09 DIAGNOSIS — H02401 Unspecified ptosis of right eyelid: Secondary | ICD-10-CM | POA: Diagnosis not present

## 2020-10-09 DIAGNOSIS — H02831 Dermatochalasis of right upper eyelid: Secondary | ICD-10-CM | POA: Diagnosis not present

## 2020-10-09 DIAGNOSIS — H02402 Unspecified ptosis of left eyelid: Secondary | ICD-10-CM | POA: Diagnosis not present

## 2020-10-09 DIAGNOSIS — H02834 Dermatochalasis of left upper eyelid: Secondary | ICD-10-CM | POA: Diagnosis not present

## 2020-10-21 ENCOUNTER — Other Ambulatory Visit: Payer: Self-pay | Admitting: Oncology

## 2020-10-23 ENCOUNTER — Telehealth: Payer: Self-pay | Admitting: Oncology

## 2020-10-23 NOTE — Telephone Encounter (Signed)
R/s appts per 5/8 sch msg. Called pt, no answer. Left msg with updated appt date and time.

## 2020-10-24 DIAGNOSIS — D225 Melanocytic nevi of trunk: Secondary | ICD-10-CM | POA: Diagnosis not present

## 2020-10-24 DIAGNOSIS — D2262 Melanocytic nevi of left upper limb, including shoulder: Secondary | ICD-10-CM | POA: Diagnosis not present

## 2020-10-24 DIAGNOSIS — L821 Other seborrheic keratosis: Secondary | ICD-10-CM | POA: Diagnosis not present

## 2020-10-24 DIAGNOSIS — D2239 Melanocytic nevi of other parts of face: Secondary | ICD-10-CM | POA: Diagnosis not present

## 2020-11-20 ENCOUNTER — Other Ambulatory Visit: Payer: Self-pay | Admitting: Oncology

## 2020-11-20 DIAGNOSIS — C50411 Malignant neoplasm of upper-outer quadrant of right female breast: Secondary | ICD-10-CM

## 2020-11-20 DIAGNOSIS — Z170 Estrogen receptor positive status [ER+]: Secondary | ICD-10-CM

## 2020-11-26 DIAGNOSIS — Z803 Family history of malignant neoplasm of breast: Secondary | ICD-10-CM | POA: Diagnosis not present

## 2020-11-26 DIAGNOSIS — Z1231 Encounter for screening mammogram for malignant neoplasm of breast: Secondary | ICD-10-CM | POA: Diagnosis not present

## 2020-11-26 LAB — HM MAMMOGRAPHY

## 2020-12-03 ENCOUNTER — Encounter: Payer: Self-pay | Admitting: Oncology

## 2020-12-05 ENCOUNTER — Encounter: Payer: Self-pay | Admitting: Internal Medicine

## 2020-12-31 ENCOUNTER — Ambulatory Visit: Payer: Medicare Other | Admitting: Oncology

## 2020-12-31 ENCOUNTER — Other Ambulatory Visit: Payer: Medicare Other

## 2021-01-07 ENCOUNTER — Other Ambulatory Visit: Payer: Self-pay | Admitting: *Deleted

## 2021-01-07 DIAGNOSIS — Z170 Estrogen receptor positive status [ER+]: Secondary | ICD-10-CM

## 2021-01-07 DIAGNOSIS — C50411 Malignant neoplasm of upper-outer quadrant of right female breast: Secondary | ICD-10-CM

## 2021-01-08 ENCOUNTER — Inpatient Hospital Stay (HOSPITAL_BASED_OUTPATIENT_CLINIC_OR_DEPARTMENT_OTHER): Payer: Medicare Other | Admitting: Oncology

## 2021-01-08 ENCOUNTER — Other Ambulatory Visit: Payer: Self-pay

## 2021-01-08 ENCOUNTER — Inpatient Hospital Stay: Payer: Medicare Other | Attending: Oncology

## 2021-01-08 VITALS — BP 129/67 | HR 65 | Temp 97.5°F | Resp 18 | Ht 62.0 in | Wt 127.9 lb

## 2021-01-08 DIAGNOSIS — Z808 Family history of malignant neoplasm of other organs or systems: Secondary | ICD-10-CM | POA: Insufficient documentation

## 2021-01-08 DIAGNOSIS — Z923 Personal history of irradiation: Secondary | ICD-10-CM | POA: Insufficient documentation

## 2021-01-08 DIAGNOSIS — Z800 Family history of malignant neoplasm of digestive organs: Secondary | ICD-10-CM | POA: Diagnosis not present

## 2021-01-08 DIAGNOSIS — Z79810 Long term (current) use of selective estrogen receptor modulators (SERMs): Secondary | ICD-10-CM | POA: Diagnosis not present

## 2021-01-08 DIAGNOSIS — Z86718 Personal history of other venous thrombosis and embolism: Secondary | ICD-10-CM | POA: Insufficient documentation

## 2021-01-08 DIAGNOSIS — Z803 Family history of malignant neoplasm of breast: Secondary | ICD-10-CM | POA: Insufficient documentation

## 2021-01-08 DIAGNOSIS — E785 Hyperlipidemia, unspecified: Secondary | ICD-10-CM | POA: Insufficient documentation

## 2021-01-08 DIAGNOSIS — Z90722 Acquired absence of ovaries, bilateral: Secondary | ICD-10-CM | POA: Diagnosis not present

## 2021-01-08 DIAGNOSIS — Z87891 Personal history of nicotine dependence: Secondary | ICD-10-CM | POA: Insufficient documentation

## 2021-01-08 DIAGNOSIS — Z79899 Other long term (current) drug therapy: Secondary | ICD-10-CM | POA: Diagnosis not present

## 2021-01-08 DIAGNOSIS — M8580 Other specified disorders of bone density and structure, unspecified site: Secondary | ICD-10-CM | POA: Insufficient documentation

## 2021-01-08 DIAGNOSIS — Z809 Family history of malignant neoplasm, unspecified: Secondary | ICD-10-CM | POA: Diagnosis not present

## 2021-01-08 DIAGNOSIS — C773 Secondary and unspecified malignant neoplasm of axilla and upper limb lymph nodes: Secondary | ICD-10-CM | POA: Insufficient documentation

## 2021-01-08 DIAGNOSIS — Z8719 Personal history of other diseases of the digestive system: Secondary | ICD-10-CM | POA: Diagnosis not present

## 2021-01-08 DIAGNOSIS — Z8041 Family history of malignant neoplasm of ovary: Secondary | ICD-10-CM | POA: Insufficient documentation

## 2021-01-08 DIAGNOSIS — Z170 Estrogen receptor positive status [ER+]: Secondary | ICD-10-CM

## 2021-01-08 DIAGNOSIS — C50411 Malignant neoplasm of upper-outer quadrant of right female breast: Secondary | ICD-10-CM | POA: Insufficient documentation

## 2021-01-08 DIAGNOSIS — Z823 Family history of stroke: Secondary | ICD-10-CM | POA: Diagnosis not present

## 2021-01-08 DIAGNOSIS — Z8249 Family history of ischemic heart disease and other diseases of the circulatory system: Secondary | ICD-10-CM | POA: Insufficient documentation

## 2021-01-08 LAB — CBC WITH DIFFERENTIAL (CANCER CENTER ONLY)
Abs Immature Granulocytes: 0.01 10*3/uL (ref 0.00–0.07)
Basophils Absolute: 0 10*3/uL (ref 0.0–0.1)
Basophils Relative: 1 %
Eosinophils Absolute: 0.2 10*3/uL (ref 0.0–0.5)
Eosinophils Relative: 3 %
HCT: 40.3 % (ref 36.0–46.0)
Hemoglobin: 13.6 g/dL (ref 12.0–15.0)
Immature Granulocytes: 0 %
Lymphocytes Relative: 30 %
Lymphs Abs: 1.6 10*3/uL (ref 0.7–4.0)
MCH: 31.9 pg (ref 26.0–34.0)
MCHC: 33.7 g/dL (ref 30.0–36.0)
MCV: 94.6 fL (ref 80.0–100.0)
Monocytes Absolute: 0.4 10*3/uL (ref 0.1–1.0)
Monocytes Relative: 8 %
Neutro Abs: 3 10*3/uL (ref 1.7–7.7)
Neutrophils Relative %: 58 %
Platelet Count: 216 10*3/uL (ref 150–400)
RBC: 4.26 MIL/uL (ref 3.87–5.11)
RDW: 12 % (ref 11.5–15.5)
WBC Count: 5.3 10*3/uL (ref 4.0–10.5)
nRBC: 0 % (ref 0.0–0.2)

## 2021-01-08 LAB — CMP (CANCER CENTER ONLY)
ALT: 8 U/L (ref 0–44)
AST: 20 U/L (ref 15–41)
Albumin: 3.8 g/dL (ref 3.5–5.0)
Alkaline Phosphatase: 37 U/L — ABNORMAL LOW (ref 38–126)
Anion gap: 8 (ref 5–15)
BUN: 8 mg/dL (ref 8–23)
CO2: 29 mmol/L (ref 22–32)
Calcium: 9.2 mg/dL (ref 8.9–10.3)
Chloride: 105 mmol/L (ref 98–111)
Creatinine: 0.78 mg/dL (ref 0.44–1.00)
GFR, Estimated: >60 mL/min (ref >60)
Glucose, Bld: 80 mg/dL (ref 70–99)
Potassium: 4 mmol/L (ref 3.5–5.1)
Sodium: 142 mmol/L (ref 135–145)
Total Bilirubin: 0.3 mg/dL (ref 0.3–1.2)
Total Protein: 6.6 g/dL (ref 6.5–8.1)

## 2021-01-08 NOTE — Progress Notes (Signed)
[place]  Telephone:([phone]) [phone] Fax:(336) [phone]    ID: Jaime Young   DOB: December 21, 1950  MR#: [id number]  TAV#:[id number]  Patient Care Team: Biagio Borg, MD as PCP - General Isaiah Blakes [name], MD (Radiology) Jenesis Suchy, [name], MD as Consulting Physician (Oncology) Hutto, Algis Greenhouse, OD as Consulting Physician (Optometry) OTHER:    CHIEF COMPLAINT:  Hx of Right Breast Cancer  CURRENT TREATMENT: Completing 10 years tamoxifen    INTERVAL HISTORY: Jaime Young returns today for her estrogen receptor positive breast cancer.   She continues on tamoxifen, which she tolerates well.  Hot flashes and vaginal wetness have not been an issue.  Since her last visit, she underwent bilateral screening mammography with tomography at [facility] on 11/26/2020 showing: breast density category B; no evidence of malignancy in either breast.    REVIEW OF SYSTEMS: [name] tells me her husband recently had back surgery so she is his primary caregiver which keeps her busy.  They also see his dad who is currently in the nursing home [facility]  They visit him every other day.  For exercise Jenny Young likes yard work she also takes walks.  She is very busy with her grandchildren.  A detailed review of systems was otherwise benign.   COVID 19 VACCINATION STATUS: [place] x3, most recently 03/2020   BREAST CANCER HISTORY:  from the original intake note:   She herself palpated a mass in her right breast, and brought it to Dr. [name] attention.  He set her up for a screening diagnostic mammography on July 09, 2009.  Dr. [name] performed the procedure, found no definite mammographic abnormality corresponding to the palpable complaint.  The ultrasound of the right breast did show a mixed echogenic mass at approximately 12 o'clock, extending to the implant capsule, and measuring 2.5 cm.  There was at least one abnormal lymph node in the right axilla, measuring 2.2 cm.     With this information, the patient was recalled for biopsy performed January 31,2011, and this showed ([id number]) an invasive ductal carcinoma with micropapillary features with the lymph node biopsy positive for a very similar looking tumor.  Grade of this tumor is not estimated.  The prognostic panel showed it to be estrogen receptor positive at 56%, progesterone receptor positive at 25%, with a borderline MIB-1 at 19%.  There was no amplification of HER2 by CISH with a ratio of 1.50.    With this information, the patient met with Dr. [name], and bilateral breast MRIs were obtained July 18, 2009.  There was an enhancing 2.8 cm mass corresponding to the previously biopsied malignancy, and an enlarged right axillary lymph node measuring 2.1 cm.  The left breast was unremarkable.  Dr. [name] discussed these findings with the patient, and suggested neoadjuvant treatment.  Her only option would be a modified radical mastectomy.  He felt it was most reasonable to start with chemotherapy with a view to possibly making lumpectomy and radiation therapy possible.    Her subsequent history is as detailed below.   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  Of course she has breast implants in place.  These were removed and replaced in 2001 because of contractures.  She is status post tonsillectomy and adenoidectomy.      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 [name].  She has also had left eye cataract surgery.    PAST SURGICAL HISTORY: Past Surgical History:  Procedure Laterality Date   BACK SURGERY  2002   BREAST LUMPECTOMY Right 2011   axillary node dissection   BREAST SURGERY  1989/2001   implants   HEMORRHOID SURGERY  1983   PARS PLANA VITRECTOMY Left 2013   w/Membranectomy   TONSILLECTOMY     TOTAL ABDOMINAL HYSTERECTOMY W/ BILATERAL SALPINGOOPHORECTOMY  1983   secondary to edometriosis    FAMILY HISTORY Family History  Problem Relation Age of Onset   Stroke Mother    Hypertension Mother    Heart disease Father    Cancer Father    Esophageal cancer Father    Cancer Sister    Stomach cancer Sister    Breast cancer Other    Bone cancer Other    Stomach cancer Other    Colon cancer Neg Hx    Rectal cancer Neg Hx   The patient's  father died at the age of 41 from what sounds like a gastroesophageal junction tumor.  The patient's  mother died at the age of 60 after multiple strokes.  The patient has five brothers and three sisters, all of her siblings are half-siblings.  The problems are all on her father's side.  The sister, [name], had breast and ovarian cancer.  She is deceased, and was never tested for BRCA1 and 2.  Second sister had throat cancer.  There is no other history of cancer in the immediate family to her knowledge.     GYNECOLOGIC HISTORY:  (Updated March 2015) She is GXP1.  First pregnancy to term age 36.  Status post TAH/BSO in 1983 secondary to endometriosis. She took estrogen replacement between 1983 and 2010.  Went off the [place] patch at the time of diagnosis in 2011.    SOCIAL HISTORY:  (updated September 2020). She worked for the [employer] here in town but has retired.  Her husband, [name], works for  [employer] in the Photographer.  Daughter, Jaime Young, is a stay-at-home mom.  The patient's son-in-law died suddenly at home 10/30/18, presumably from cardiac causes.  Toleen has 3 grandchildren.  They are being homeschooled.  The patient attends a [employer].      ADVANCED DIRECTIVES: Not in place   HEALTH MAINTENANCE:  Social History   Tobacco Use   Smoking status: Former   Smokeless tobacco: Never  Vaping Use   Vaping Use: Never used  Substance Use Topics   Alcohol use: No   Drug use: No     Colonoscopy:  2004  PAP: Feb 2014/Dr. Lomax  Bone density:  09/11/2010 (Dr. Ubaldo Glassing), osteopenia  Lipid panel:  Jan 2014/DrJenny Young   Current Outpatient Medications  Medication Sig Dispense Refill   brimonidine-timolol (COMBIGAN) 0.2-0.5 % ophthalmic solution Place 1  drop into the left eye 2 (two) times daily.      Calcium-Magnesium-Vitamin D [phone] MG-MG-UNIT TABS Take 2 tablets by mouth 2 (two) times daily.      cyanocobalamin 500 MCG tablet Take 500 mcg by mouth daily.     fluconazole (DIFLUCAN) 150 MG tablet 1 tab by mouth every 3 days as needed 2 tablet 1   Multiple Vitamins tablet Take 1 tablet by mouth daily. Reported on 08/02/2015     Omega-3 Fatty Acids (FISH OIL) 300 MG CAPS Take 1 capsule by mouth daily.     simvastatin (ZOCOR) 20 MG tablet Take 1 tablet (20 mg total) by mouth at bedtime. 90 tablet 3   tamoxifen (NOLVADEX) 20 MG tablet Take 1 tablet by mouth once daily 60 tablet 0   vitamin C (ASCORBIC ACID) 500 MG tablet Take 1,000 mg by mouth daily.     No current facility-administered medications for this visit.    OBJECTIVE: White woman who appears younger than stated age  8:   01/08/21 1306  BP: 129/67  Pulse: 65  Resp: 18  Temp: (!) 97.5 F (36.4 C)  SpO2: 97%   Body mass index is 23.39 kg/m.    ECOG:  1 Filed Weights   01/08/21 1306  Weight: 127 lb 14.4 oz (58 kg)    Sclerae unicteric, EOMs intact Wearing a mask No cervical or  supraclavicular adenopathy Lungs no rales or rhonchi Heart regular rate and rhythm Abd soft, nontender, positive bowel sounds MSK no focal spinal tenderness, no upper extremity lymphedema Neuro: nonfocal, well oriented, appropriate affect Breasts: The right breast is status postlumpectomy and implant placement as well as radiation.  There is no evidence of local recurrence.  The left breast is status post augmentation.  There are no suspicious findings.  Both axillae are benign.   LAB RESULTS: Lab Results  Component Value Date   WBC 5.3 01/08/2021   NEUTROABS 3.0 01/08/2021   HGB 13.6 01/08/2021   HCT 40.3 01/08/2021   MCV 94.6 01/08/2021   PLT 216 01/08/2021      Chemistry      Component Value Date/Time   NA 143 09/11/2020 0951   NA 142 09/16/2016 0746   K 3.5 09/11/2020 0951   K 3.9 09/16/2016 0746   CL 105 09/11/2020 0951   CL 105 08/02/2012 1513   CO2 27 09/11/2020 0951   CO2 26 09/16/2016 0746   BUN 12 09/11/2020 0951   BUN 10.6 09/16/2016 0746   CREATININE 0.82 09/11/2020 0951   CREATININE 0.80 02/27/2020 0857   CREATININE 0.8 09/16/2016 0746      Component Value Date/Time   CALCIUM 9.2 09/11/2020 0951   CALCIUM 9.2 09/16/2016 0746   ALKPHOS 35 (L) 09/11/2020 0951   ALKPHOS 38 (L) 09/16/2016 0746   AST 21 09/11/2020 0951   AST 34 02/27/2020 0857   AST 21 09/16/2016 0746   ALT 10 09/11/2020 0951   ALT 19 02/27/2020 0857   ALT 17 09/16/2016 0746   BILITOT 0.3 09/11/2020 0951   BILITOT 0.3 02/27/2020 0857   BILITOT 0.22 09/16/2016 0746      STUDIES: No results found.   ASSESSMENT: 70 y.o. [place] woman   (1)  status post right breast and axillary lymph node biopsy January 2011, both positive for a clinical T2 N1 invasive ductal carcinoma which was estrogen and progesterone receptor-positive, HER-2/neu-negative with an MIB-1 of 19%.  (2)  Neoadjuvantly she received 4 cycles of dose-dense doxorubicin and cyclophosphamide  followed by 3 doses of weekly  paclitaxel discontinued secondary to neuropathy (which has resolved), followed by carboplatin and gemcitabine for 2 cycles with very poor tolerance,completed July 2011  (2) underwent right lumpectomy and axillary lymph node dissection August 2011 for a residual 1.5 cm invasive ductal carcinoma, grade 2, involving 2/11 lymph nodes.  Margins were negative.   (3)  completed radiation therapy in November 2011, then started letrozole but this had to be discontinued April 2012 because of tolerance issues.   (4) on tamoxifen as of May 2012, completing >10 years July 2022   PLAN:  Meryn is now 11 years out from definitive surgery for her breast cancer with no evidence of disease recurrence.  This is very favorable.  She has completed actually a little bit more than 10 years on tamoxifen.  She understands we do not have data for continuing tamoxifen beyond 10years.  Accordingly she is going off that medication.  She is very pleased to be "graduating" today  At this point I am comfortable releasing her to her primary care physicians.  All she will need in terms of breast cancer follow-up is her yearly mammography and a yearly physician breast exam.  We will be glad to see since again at any point in the future if and when the need arises but as of now are making no further routine appointment for her here  Total encounter time 25 minutes.*   Adrik Khim, [name], MD  01/08/21 1:33 PM Medical Oncology and Hematology [facility] [place], [place] [address] Tel. [phone]    Fax. [phone]   I, Wilburn Mylar, am acting as scribe for Dr. [name]. Monique Hefty.  I, Lurline Del MD, have reviewed the above documentation for accuracy and completeness, and I agree with the above.   *Total Encounter Time as defined by the Centers for Medicare and Medicaid Services includes, in addition to the face-to-face time of a patient visit (documented in the note above)  non-face-to-face time: obtaining and reviewing outside history, ordering and reviewing medications, tests or procedures, care coordination (communications with other health care professionals or caregivers) and documentation in the medical record.

## 2021-01-31 DIAGNOSIS — Z974 Presence of external hearing-aid: Secondary | ICD-10-CM | POA: Insufficient documentation

## 2021-01-31 DIAGNOSIS — Z9889 Other specified postprocedural states: Secondary | ICD-10-CM | POA: Insufficient documentation

## 2021-02-06 DIAGNOSIS — H02402 Unspecified ptosis of left eyelid: Secondary | ICD-10-CM | POA: Diagnosis not present

## 2021-02-06 DIAGNOSIS — H02834 Dermatochalasis of left upper eyelid: Secondary | ICD-10-CM | POA: Diagnosis not present

## 2021-02-06 DIAGNOSIS — H02831 Dermatochalasis of right upper eyelid: Secondary | ICD-10-CM | POA: Diagnosis not present

## 2021-02-12 DIAGNOSIS — Z48810 Encounter for surgical aftercare following surgery on the sense organs: Secondary | ICD-10-CM | POA: Diagnosis not present

## 2021-02-12 DIAGNOSIS — Z4802 Encounter for removal of sutures: Secondary | ICD-10-CM | POA: Diagnosis not present

## 2021-02-27 ENCOUNTER — Ambulatory Visit (INDEPENDENT_AMBULATORY_CARE_PROVIDER_SITE_OTHER): Payer: Medicare Other

## 2021-02-27 DIAGNOSIS — Z23 Encounter for immunization: Secondary | ICD-10-CM

## 2021-03-03 ENCOUNTER — Encounter: Payer: Self-pay | Admitting: Internal Medicine

## 2021-03-14 ENCOUNTER — Encounter: Payer: Self-pay | Admitting: Gastroenterology

## 2021-03-26 DIAGNOSIS — H35373 Puckering of macula, bilateral: Secondary | ICD-10-CM | POA: Diagnosis not present

## 2021-03-26 DIAGNOSIS — Z961 Presence of intraocular lens: Secondary | ICD-10-CM | POA: Diagnosis not present

## 2021-03-26 DIAGNOSIS — H2511 Age-related nuclear cataract, right eye: Secondary | ICD-10-CM | POA: Diagnosis not present

## 2021-03-26 DIAGNOSIS — H34832 Tributary (branch) retinal vein occlusion, left eye, with macular edema: Secondary | ICD-10-CM | POA: Diagnosis not present

## 2021-04-23 DIAGNOSIS — Z48810 Encounter for surgical aftercare following surgery on the sense organs: Secondary | ICD-10-CM | POA: Diagnosis not present

## 2021-09-12 ENCOUNTER — Encounter: Payer: Self-pay | Admitting: Internal Medicine

## 2021-09-12 ENCOUNTER — Ambulatory Visit (INDEPENDENT_AMBULATORY_CARE_PROVIDER_SITE_OTHER): Payer: Medicare Other | Admitting: Internal Medicine

## 2021-09-12 VITALS — BP 112/68 | HR 66 | Temp 98.0°F | Ht 62.0 in | Wt 131.0 lb

## 2021-09-12 DIAGNOSIS — E538 Deficiency of other specified B group vitamins: Secondary | ICD-10-CM | POA: Diagnosis not present

## 2021-09-12 DIAGNOSIS — R9431 Abnormal electrocardiogram [ECG] [EKG]: Secondary | ICD-10-CM

## 2021-09-12 DIAGNOSIS — R7302 Impaired glucose tolerance (oral): Secondary | ICD-10-CM | POA: Diagnosis not present

## 2021-09-12 DIAGNOSIS — E559 Vitamin D deficiency, unspecified: Secondary | ICD-10-CM

## 2021-09-12 DIAGNOSIS — J309 Allergic rhinitis, unspecified: Secondary | ICD-10-CM | POA: Diagnosis not present

## 2021-09-12 DIAGNOSIS — E785 Hyperlipidemia, unspecified: Secondary | ICD-10-CM | POA: Diagnosis not present

## 2021-09-12 LAB — BASIC METABOLIC PANEL
BUN: 13 mg/dL (ref 6–23)
CO2: 29 mEq/L (ref 19–32)
Calcium: 9.4 mg/dL (ref 8.4–10.5)
Chloride: 105 mEq/L (ref 96–112)
Creatinine, Ser: 0.87 mg/dL (ref 0.40–1.20)
GFR: 66.88 mL/min (ref >60.00)
Glucose, Bld: 78 mg/dL (ref 70–99)
Potassium: 3.8 mEq/L (ref 3.5–5.1)
Sodium: 142 mEq/L (ref 135–145)

## 2021-09-12 LAB — URINALYSIS, ROUTINE W REFLEX MICROSCOPIC
Bilirubin Urine: NEGATIVE
Hgb urine dipstick: NEGATIVE
Ketones, ur: NEGATIVE
Leukocytes,Ua: NEGATIVE
Nitrite: NEGATIVE
RBC / HPF: NONE SEEN (ref 0–?)
Specific Gravity, Urine: <=1.005 — AB (ref 1.000–1.030)
Total Protein, Urine: NEGATIVE
Urine Glucose: NEGATIVE
Urobilinogen, UA: 0.2 (ref 0.0–1.0)
pH: 6.5 (ref 5.0–8.0)

## 2021-09-12 LAB — CBC WITH DIFFERENTIAL/PLATELET
Basophils Absolute: 0 10*3/uL (ref 0.0–0.1)
Basophils Relative: 0.6 % (ref 0.0–3.0)
Eosinophils Absolute: 0.2 10*3/uL (ref 0.0–0.7)
Eosinophils Relative: 4 % (ref 0.0–5.0)
HCT: 41.9 % (ref 36.0–46.0)
Hemoglobin: 14.2 g/dL (ref 12.0–15.0)
Lymphocytes Relative: 30.4 % (ref 12.0–46.0)
Lymphs Abs: 1.2 10*3/uL (ref 0.7–4.0)
MCHC: 33.8 g/dL (ref 30.0–36.0)
MCV: 91.2 fl (ref 78.0–100.0)
Monocytes Absolute: 0.3 10*3/uL (ref 0.1–1.0)
Monocytes Relative: 7.3 % (ref 3.0–12.0)
Neutro Abs: 2.3 10*3/uL (ref 1.4–7.7)
Neutrophils Relative %: 57.7 % (ref 43.0–77.0)
Platelets: 228 10*3/uL (ref 150.0–400.0)
RBC: 4.59 Mil/uL (ref 3.87–5.11)
RDW: 13.1 % (ref 11.5–15.5)
WBC: 4.1 10*3/uL (ref 4.0–10.5)

## 2021-09-12 LAB — LIPID PANEL
Cholesterol: 201 mg/dL — ABNORMAL HIGH (ref 0–200)
HDL: 50.8 mg/dL (ref >39.00)
LDL Cholesterol: 135 mg/dL — ABNORMAL HIGH (ref 0–99)
NonHDL: 150.01
Total CHOL/HDL Ratio: 4
Triglycerides: 74 mg/dL (ref 0.0–149.0)
VLDL: 14.8 mg/dL (ref 0.0–40.0)

## 2021-09-12 LAB — HEPATIC FUNCTION PANEL
ALT: 12 U/L (ref 0–35)
AST: 24 U/L (ref 0–37)
Albumin: 4.4 g/dL (ref 3.5–5.2)
Alkaline Phosphatase: 49 U/L (ref 39–117)
Bilirubin, Direct: 0 mg/dL (ref 0.0–0.3)
Total Bilirubin: 0.2 mg/dL (ref 0.2–1.2)
Total Protein: 6.7 g/dL (ref 6.0–8.3)

## 2021-09-12 LAB — HEMOGLOBIN A1C: Hgb A1c MFr Bld: 6.1 % (ref 4.6–6.5)

## 2021-09-12 LAB — TSH: TSH: 2.68 u[IU]/mL (ref 0.35–5.50)

## 2021-09-12 LAB — VITAMIN B12: Vitamin B-12: 238 pg/mL (ref 211–911)

## 2021-09-12 LAB — VITAMIN D 25 HYDROXY (VIT D DEFICIENCY, FRACTURES): VITD: 53.73 ng/mL (ref 30.00–100.00)

## 2021-09-12 MED ORDER — SIMVASTATIN 20 MG PO TABS: 20.0000 mg | ORAL_TABLET | Freq: Every day | ORAL | 3 refills | Status: DC

## 2021-09-12 NOTE — Patient Instructions (Addendum)
Please continue all other medications as before, and refills have been done if requested. ? ?Please have the pharmacy call with any other refills you may need. ? ?Please continue your efforts at being more active, low cholesterol diet, and weight control. ? ?You are otherwise up to date with prevention measures today. ? ?Please keep your appointments with your specialists as you may have planned ? ?We have discussed the Cardiac CT Score test to measure the calcification level (if any) in your heart arteries.  This test has been ordered in our [place], so please call [facility] directly, as they prefer this, at [phone] to be scheduled. ? ?Please go to the LAB at the blood drawing area for the tests to be done ? ?You will be contacted by phone if any changes need to be made immediately.  Otherwise, you will receive a letter about your results with an explanation, but please check with MyChart first. ? ?Please remember to sign up for MyChart if you have not done so, as this will be important to you in the future with finding out test results, communicating by private email, and scheduling acute appointments online when needed. ? ?Please make an Appointment to return for your 1 year visit, or sooner if needed ?

## 2021-09-12 NOTE — Progress Notes (Addendum)
Patient ID: Jaime Young, female   DOB: Dec 23, 1950, 71 y.o.   MRN: [id number] ? ? ? ?     Chief Complaint:: yearly exam ? ?     HPI:  Jaime Young is a 71 y.o. female here Also tolerating the 20 mg zocor well.  Does have several wks ongoing nasal allergy symptoms with clearish congestion, itch and sneezing, without fever, pain, ST, cough, swelling or wheezing. Trying to follow lower chol diet.  Pt denies chest pain, increased sob or doe, wheezing, orthopnea, PND, increased LE swelling, palpitations, dizziness or syncope.   Pt denies polydipsia, polyuria, or new focal neuro s/s.    Pt denies fever, wt loss, night sweats, loss of appetite, or other constitutional symptoms  no other new complaints ? ?Wt Readings from Last 3 Encounters:  ?09/12/21 131 lb (59.4 kg)  ?01/08/21 127 lb 14.4 oz (58 kg)  ?09/11/20 130 lb 6.4 oz (59.1 kg)  ? ?BP Readings from Last 3 Encounters:  ?09/12/21 112/68  ?01/08/21 129/67  ?09/11/20 116/70  ? ?Immunization History  ?Administered Date(s) Administered  ? Fluad Quad(high Dose 65+) 03/04/2019, 06/14/2020, 02/27/2021  ? Hepatitis A, Adult 05/24/2018  ? Influenza, High Dose Seasonal PF 04/18/2016  ? PFIZER Comirnaty(Gray Top)Covid-19 Tri-Sucrose Vaccine 08/06/2019, 08/29/2019, 04/14/2020  ? PFIZER(Purple Top)SARS-COV-2 Vaccination 08/06/2019, 08/29/2019, 04/14/2020  ? Pneumococcal Conjugate-13 08/25/2016  ? Pneumococcal Polysaccharide-23 08/27/2017  ? Zoster, Live 07/12/2012  ? ?There are no preventive care reminders to display for this patient. ? ?  ? ?Past Medical History:  ?Diagnosis Date  ? Allergic rhinitis   ? Anxiety   ? Cancer [facility])   ? stage I right breast cancer  ? Diverticulitis   ? Diverticulosis of colon   ? DJD (degenerative joint disease) of lumbar spine   ? DVT (deep venous thrombosis) ([place]) 1977  ? Hearing loss of both ears   ? from radation  ? Hyperlipidemia   ? Impaired glucose tolerance 11/17/2010  ? Migraine   ? Osteopenia 09/18/2010  ? Post-operative nausea and  vomiting   ? RADIAL NERVE INJURY   ? Raynaud's disease   ? RAYNAUD'S DISEASE   ? SUPERFICIAL PHLEBITIS 09/01/2007  ? ?Past Surgical History:  ?Procedure Laterality Date  ? BACK SURGERY  2002  ? BREAST LUMPECTOMY Right 2011  ? axillary node dissection  ? BREAST SURGERY  1989/2001  ? implants  ? [place]  ? PARS PLANA VITRECTOMY Left 2013  ? w/Membranectomy  ? TONSILLECTOMY    ? TOTAL ABDOMINAL HYSTERECTOMY W/ BILATERAL SALPINGOOPHORECTOMY  1983  ? secondary to edometriosis  ? ? reports that she has quit smoking. She has never used smokeless tobacco. She reports that she does not drink alcohol and does not use drugs. ?family history includes Bone cancer in an other family member; Breast cancer in an other family member; Cancer in her father and sister; Esophageal cancer in her father; Heart disease in her father; Hypertension in her mother; Stomach cancer in her sister and another family member; Stroke in her mother. ?Allergies  ?Allergen Reactions  ? Dextromethorphan Swelling  ? Ciprofloxacin Other (See Comments)  ?  Pt denies  ? Codeine Nausea Only  ? Hydrocodone Nausea And Vomiting  ?  Pt indicates N/V occurs with all narcotics.   ? Penicillins Hives  ? Sulfonamide Derivatives Nausea Only  ? Aflibercept Rash  ? ?Current Outpatient Medications on File Prior to Visit  ?Medication Sig Dispense Refill  ? brimonidine-timolol (COMBIGAN) 0.2-0.5 % ophthalmic  solution Place 1 drop into the left eye 2 (two) times daily.     ? Calcium-Magnesium-Vitamin D [phone] MG-MG-UNIT TABS Take 2 tablets by mouth 2 (two) times daily.     ? Multiple Vitamins tablet Take 1 tablet by mouth daily. Reported on 08/02/2015    ? Omega-3 Fatty Acids (FISH OIL) 300 MG CAPS Take 1 capsule by mouth daily.    ? vitamin C (ASCORBIC ACID) 500 MG tablet Take 1,000 mg by mouth daily.    ? ?No current facility-administered medications on file prior to visit.  ? ?     ROS:  All others reviewed and negative. ? ?Objective  ? ?     PE:  BP  112/68 (BP Location: Left Arm, Patient Position: Sitting, Cuff Size: Normal)   Pulse 66   Temp 98 ?F (36.7 ?C) (Oral)   Ht '5\' 2"'$  (1.575 m)   Wt 131 lb (59.4 kg)   SpO2 98%   BMI 23.96 kg/m?  ? ?              Constitutional: Pt appears in NAD ?              HENT: Head: NCAT.  ?              Right Ear: External ear normal.   ?              Left Ear: External ear normal.  ?              Eyes: . Pupils are equal, round, and reactive to light. Conjunctivae and EOM are normal ?              Nose: without d/c or deformity ?              Neck: Neck supple. Gross normal ROM ?              Cardiovascular: Normal rate and regular rhythm.   ?              Pulmonary/Chest: Effort normal and breath sounds without rales or wheezing.  ?              Abd:  Soft, NT, ND, + BS, no organomegaly ?              Neurological: Pt is alert. At baseline orientation, motor grossly intact ?              Skin: Skin is warm. No rashes, no other new lesions, LE edema - none ?              Psychiatric: Pt behavior is normal without agitation  ? ?Micro: none ? ?Cardiac tracings I have personally interpreted today:  none ? ?Pertinent Radiological findings (summarize): none  ? ?Lab Results  ?Component Value Date  ? WBC 4.1 09/12/2021  ? HGB 14.2 09/12/2021  ? HCT 41.9 09/12/2021  ? PLT 228.0 09/12/2021  ? GLUCOSE 78 09/12/2021  ? CHOL 201 (H) 09/12/2021  ? TRIG 74.0 09/12/2021  ? HDL 50.80 09/12/2021  ? LDLDIRECT 160.6 12/20/2007  ? LDLCALC 135 (H) 09/12/2021  ? ALT 12 09/12/2021  ? AST 24 09/12/2021  ? NA 142 09/12/2021  ? K 3.8 09/12/2021  ? CL 105 09/12/2021  ? CREATININE 0.87 09/12/2021  ? BUN 13 09/12/2021  ? CO2 29 09/12/2021  ? TSH 2.68 09/12/2021  ? INR 1.00 02/04/2010  ? HGBA1C 6.1 09/12/2021  ? ?Assessment/Plan:  ?Jaime  Jaime Young is a 71 y.o. White or Caucasian [1] female with  has a past medical history of Allergic rhinitis, Anxiety, Cancer ([place]), Diverticulitis, Diverticulosis of colon, DJD (degenerative joint disease) of lumbar  spine, DVT (deep venous thrombosis) ([place]) (1977), Hearing loss of both ears, Hyperlipidemia, Impaired glucose tolerance (11/17/2010), Migraine, Osteopenia (09/18/2010), Post-operative nausea and vomiting, RADIAL NERVE INJURY, Raynaud's disease, RAYNAUD'S DISEASE, and SUPERFICIAL PHLEBITIS (09/01/2007). ? ?Impaired glucose tolerance ?Lab Results  ?Component Value Date  ? HGBA1C 6.1 09/12/2021  ? ?Stable, pt to continue current medical treatment  - diet ? ? ?Hyperlipidemia ?Lab Results  ?Component Value Date  ? LDLCALC 135 (H) 09/12/2021  ? ?Uncontrolled, goal ldl < 100, pt to increase statin 40 mg ? ? ?Allergic rhinitis ?Mild uncontrolled, for otc anthistamin and nasacort asd,  to f/u any worsening symptoms or concerns ? ?Followup: Return in about 1 year (around 09/13/2022). ? ?Cathlean Cower, MD 09/14/2021 10:22 PM ?[place] ?[place] ?Internal Medicine ?

## 2021-09-13 ENCOUNTER — Encounter: Payer: Self-pay | Admitting: Internal Medicine

## 2021-09-14 ENCOUNTER — Other Ambulatory Visit: Payer: Self-pay | Admitting: Internal Medicine

## 2021-09-14 ENCOUNTER — Encounter: Payer: Self-pay | Admitting: Internal Medicine

## 2021-09-14 MED ORDER — SIMVASTATIN 40 MG PO TABS: 40.0000 mg | ORAL_TABLET | Freq: Every day | ORAL | 3 refills | Status: DC

## 2021-09-14 NOTE — Assessment & Plan Note (Signed)
Mild uncontrolled, for otc anthistamin and nasacort asd,  to f/u any worsening symptoms or concerns ?

## 2021-09-14 NOTE — Assessment & Plan Note (Signed)
Lab Results  ?Component Value Date  ? LDLCALC 135 (H) 09/12/2021  ? ?Uncontrolled, goal ldl < 100, pt to increase statin 40 mg ? ?

## 2021-09-14 NOTE — Assessment & Plan Note (Signed)
Lab Results  ?Component Value Date  ? HGBA1C 6.1 09/12/2021  ? ?Stable, pt to continue current medical treatment  - diet ? ?

## 2021-09-19 ENCOUNTER — Ambulatory Visit (INDEPENDENT_AMBULATORY_CARE_PROVIDER_SITE_OTHER): Payer: Medicare Other

## 2021-09-19 DIAGNOSIS — Z0000 Encounter for general adult medical examination without abnormal findings: Secondary | ICD-10-CM

## 2021-09-19 NOTE — Progress Notes (Signed)
?I connected with Jaime Young today by telephone and verified that I am speaking with the correct person using two identifiers. ?Location patient: home ?Location provider: work ?Persons participating in the virtual visit: patient, provider. ?  ?I discussed the limitations, risks, security and privacy concerns of performing an evaluation and management service by telephone and the availability of in person appointments. I also discussed with the patient that there may be a patient responsible charge related to this service. The patient expressed understanding and verbally consented to this telephonic visit.  ?  ?Interactive audio and video telecommunications were attempted between this provider and patient, however failed, due to patient having technical difficulties OR patient did not have access to video capability.  We continued and completed visit with audio only. ? ?Some vital signs may be absent or patient reported.  ? ?Time Spent with patient on telephone encounter: 30 minutes ? ?Subjective:  ? Jaime Young is a 71 y.o. female who presents for Medicare Annual (Subsequent) preventive examination. ? ?Review of Systems    ? ?Cardiac Risk Factors include: advanced age (>83mn, >>54women);dyslipidemia;family history of premature cardiovascular disease ? ?   ?Objective:  ?  ?There were no vitals filed for this visit. ?There is no height or weight on file to calculate BMI. ? ? ?  09/19/2021  ?  3:34 PM 09/11/2020  ?  8:49 AM 09/07/2018  ?  8:57 AM 08/27/2017  ?  8:56 AM 03/23/2017  ?  3:49 AM 08/20/2016  ? 10:24 AM 09/25/2015  ?  2:50 PM  ?Advanced Directives  ?Does Patient Have a Medical Advance Directive? No Yes Yes No No No No  ?Type of AScientist, physiologicalof AStowellLiving will      ?Does patient want to make changes to medical advance directive?  Yes (MAU/Ambulatory/Procedural Areas - Information given)  Yes (ED - Information included in AVS)     ?Copy of [place]?    No - copy requested      ?Would patient like information on creating a medical advance directive? No - Patient declined        ? ? ?Current Medications (verified) ?Outpatient Encounter Medications as of 09/19/2021  ?Medication Sig  ? brimonidine-timolol (COMBIGAN) 0.2-0.5 % ophthalmic solution Place 1 drop into the left eye 2 (two) times daily.   ? Calcium-Magnesium-Vitamin D 5563-149-702MG-MG-UNIT TABS Take 2 tablets by mouth 2 (two) times daily.   ? Multiple Vitamins tablet Take 1 tablet by mouth daily. Reported on 08/02/2015  ? Omega-3 Fatty Acids (FISH OIL) 300 MG CAPS Take 1 capsule by mouth daily.  ? simvastatin (ZOCOR) 40 MG tablet Take 1 tablet (40 mg total) by mouth at bedtime.  ? vitamin C (ASCORBIC ACID) 500 MG tablet Take 1,000 mg by mouth daily.  ? ?No facility-administered encounter medications on file as of 09/19/2021.  ? ? ?Allergies (verified) ?Dextromethorphan, Ciprofloxacin, Codeine, Hydrocodone, Penicillins, Sulfonamide derivatives, and Aflibercept  ? ?History: ?Past Medical History:  ?Diagnosis Date  ? Allergic rhinitis   ? Anxiety   ? Cancer ([facility]   ? stage I right breast cancer  ? Diverticulitis   ? Diverticulosis of colon   ? DJD (degenerative joint disease) of lumbar spine   ? DVT (deep venous thrombosis) ([place] 1977  ? Hearing loss of both ears   ? from radation  ? Hyperlipidemia   ? Impaired glucose tolerance 11/17/2010  ? Migraine   ? Osteopenia 09/18/2010  ?  Post-operative nausea and vomiting   ? RADIAL NERVE INJURY   ? Raynaud's disease   ? RAYNAUD'S DISEASE   ? SUPERFICIAL PHLEBITIS 09/01/2007  ? ?Past Surgical History:  ?Procedure Laterality Date  ? BACK SURGERY  2002  ? BREAST LUMPECTOMY Right 2011  ? axillary node dissection  ? BREAST SURGERY  1989/2001  ? implants  ? [place]  ? PARS PLANA VITRECTOMY Left 2013  ? w/Membranectomy  ? TONSILLECTOMY    ? TOTAL ABDOMINAL HYSTERECTOMY W/ BILATERAL SALPINGOOPHORECTOMY  1983  ? secondary to edometriosis  ? ?Family History  ?Problem  Relation Age of Onset  ? Stroke Mother   ? Hypertension Mother   ? Heart disease Father   ? Cancer Father   ? Esophageal cancer Father   ? Cancer Sister   ? Stomach cancer Sister   ? Breast cancer Other   ? Bone cancer Other   ? Stomach cancer Other   ? Colon cancer Neg Hx   ? Rectal cancer Neg Hx   ? ?Social History  ? ?Socioeconomic History  ? Marital status: Married  ?  Spouse name: Not on file  ? Number of children: 1  ? Years of education: Not on file  ? Highest education level: Not on file  ?Occupational History  ? Occupation: ACCOUNT EXECUTIVE  ?  Employer: [employer]  ? Occupation: Herbalist  ?Tobacco Use  ? Smoking status: Former  ? Smokeless tobacco: Never  ?Vaping Use  ? Vaping Use: Never used  ?Substance and Sexual Activity  ? Alcohol use: No  ? Drug use: No  ? Sexual activity: Yes  ?Other Topics Concern  ? Not on file  ?Social History Narrative  ? Not on file  ? ?Social Determinants of Health  ? ?Financial Resource Strain: Low Risk   ? Difficulty of Paying Living Expenses: Not hard at all  ?Food Insecurity: No Food Insecurity  ? Worried About Charity fundraiser in the Last Year: Never true  ? Ran Out of Food in the Last Year: Never true  ?Transportation Needs: No Transportation Needs  ? Lack of Transportation (Medical): No  ? Lack of Transportation (Non-Medical): No  ?Physical Activity: Sufficiently Active  ? Days of Exercise per Week: 5 days  ? Minutes of Exercise per Session: 30 min  ?Stress: No Stress Concern Present  ? Feeling of Stress : Not at all  ?Social Connections: Socially Integrated  ? Frequency of Communication with Friends and Family: More than three times a week  ? Frequency of Social Gatherings with Friends and Family: More than three times a week  ? Attends Religious Services: More than 4 times per year  ? Active Member of Clubs or Organizations: Yes  ? Attends Archivist Meetings: More than 4 times per year  ? Marital Status: Married  ? ? ?Tobacco Counseling ?Counseling  given: Not Answered ? ? ?Clinical Intake: ? ?Pre-visit preparation completed: Yes ? ?Pain : No/denies pain ? ?  ? ?Nutritional Risks: None ?Diabetes: No ? ?How often do you need to have someone help you when you read instructions, pamphlets, or other written materials from your doctor or pharmacy?: 1 - Never ?What is the last grade level you completed in school?: Account Executive ? ?Diabetic? no ? ?Interpreter Needed?: No ? ?Information entered by :: Lisette Abu, LPN ? ? ?Activities of Daily Living ? ?  09/19/2021  ?  3:39 PM  ?In your present state of health, do you  have any difficulty performing the following activities:  ?Hearing? 1  ?Comment wear hearing aids  ?Vision? 0  ?Difficulty concentrating or making decisions? 0  ?Walking or climbing stairs? 0  ?Dressing or bathing? 0  ?Doing errands, shopping? 0  ?Preparing Food and eating ? N  ?Using the Toilet? N  ?In the past six months, have you accidently leaked urine? N  ?Do you have problems with loss of bowel control? N  ?Managing your Medications? N  ?Managing your Finances? N  ?Housekeeping or managing your Housekeeping? N  ? ? ?Patient Care Team: ?Biagio Borg, MD as PCP - General ?Lorelle Gibbs, MD (Radiology) ?Magrinat, [name], MD (Inactive) as Consulting Physician (Oncology) ?Delsa Sale, OD as Consulting Physician (Optometry) ? ?Indicate any recent Medical Services you may have received from other than [facility] providers in the past year (date may be approximate). ? ?   ?Assessment:  ? This is a routine wellness examination for [place]. ? ?Hearing/Vision screen ?Hearing Screening - Comments:: Patient has hearing difficulty and wears hearing aids. ?Vision Screening - Comments:: Patient does wear corrective lenses/contacts.  ?Eye exam done by: Teodoro Spray, OD. ? ? ?Dietary issues and exercise activities discussed: ?Current Exercise Habits: Home exercise routine, Type of exercise: walking, Time (Minutes): 30, Frequency (Times/Week): 7,  Weekly Exercise (Minutes/Week): 210, Intensity: Moderate, Exercise limited by: None identified ? ? Goals Addressed   ? ?  ?  ?  ?  ? This Visit's Progress  ?  Patient Stated     ?  My goal is to get back t

## 2021-09-19 NOTE — Patient Instructions (Signed)
Jaime Young , ?Thank you for taking time to come for your Medicare Wellness Visit. I appreciate your ongoing commitment to your health goals. Please review the following plan we discussed and let me know if I can assist you in the future.  ? ?Screening recommendations/referrals: ?Colonoscopy: 02/01/2014; due every 9 years (2024) ?Mammogram: 11/26/2021; due every year ?Bone Density: never done ?Recommended yearly ophthalmology/optometry visit for glaucoma screening and checkup ?Recommended yearly dental visit for hygiene and checkup ? ?Vaccinations: ?Influenza vaccine: 02/27/2021 ?Pneumococcal vaccine: 08/25/2016, 08/27/2017 ?Tdap vaccine: 06/16/2014; due every 10 years ?Shingles vaccine: never done   ?Covid-19: 08/06/2019, 08/29/2019, 04/14/2020 ? ?Advanced directives: Please bring a copy of your health care power of attorney and living will to the office at your convenience. ? ?Conditions/risks identified: Yes ? ?Next appointment: Please schedule your next Medicare Wellness Visit with your Nurse Health Advisor in 1 year by calling [phone]. ? ? ?Preventive Care 31 Years and Older, Female ?Preventive care refers to lifestyle choices and visits with your health care provider that can promote health and wellness. ?What does preventive care include? ?A yearly physical exam. This is also called an annual well check. ?Dental exams once or twice a year. ?Routine eye exams. Ask your health care provider how often you should have your eyes checked. ?Personal lifestyle choices, including: ?Daily care of your teeth and gums. ?Regular physical activity. ?Eating a healthy diet. ?Avoiding tobacco and drug use. ?Limiting alcohol use. ?Practicing safe sex. ?Taking low-dose aspirin every day. ?Taking vitamin and mineral supplements as recommended by your health care provider. ?What happens during an annual well check? ?The services and screenings done by your health care provider during your annual well check will depend on your age,  overall health, lifestyle risk factors, and family history of disease. ?Counseling  ?Your health care provider may ask you questions about your: ?Alcohol use. ?Tobacco use. ?Drug use. ?Emotional well-being. ?Home and relationship well-being. ?Sexual activity. ?Eating habits. ?History of falls. ?Memory and ability to understand (cognition). ?Work and work Statistician. ?Reproductive health. ?Screening  ?You may have the following tests or measurements: ?Height, weight, and BMI. ?Blood pressure. ?Lipid and cholesterol levels. These may be checked every 5 years, or more frequently if you are over 77 years old. ?Skin check. ?Lung cancer screening. You may have this screening every year starting at age 36 if you have a 30-pack-year history of smoking and currently smoke or have quit within the past 15 years. ?Fecal occult blood test (FOBT) of the stool. You may have this test every year starting at age 52. ?Flexible sigmoidoscopy or colonoscopy. You may have a sigmoidoscopy every 5 years or a colonoscopy every 10 years starting at age 58. ?Hepatitis C blood test. ?Hepatitis B blood test. ?Sexually transmitted disease (STD) testing. ?Diabetes screening. This is done by checking your blood sugar (glucose) after you have not eaten for a while (fasting). You may have this done every 1-3 years. ?Bone density scan. This is done to screen for osteoporosis. You may have this done starting at age 62. ?Mammogram. This may be done every 1-2 years. Talk to your health care provider about how often you should have regular mammograms. ?Talk with your health care provider about your test results, treatment options, and if necessary, the need for more tests. ?Vaccines  ?Your health care provider may recommend certain vaccines, such as: ?Influenza vaccine. This is recommended every year. ?Tetanus, diphtheria, and acellular pertussis (Tdap, Td) vaccine. You may need a Td booster every 10 years. ?  Zoster vaccine. You may need this after age  1. ?Pneumococcal 13-valent conjugate (PCV13) vaccine. One dose is recommended after age 85. ?Pneumococcal polysaccharide (PPSV23) vaccine. One dose is recommended after age 26. ?Talk to your health care provider about which screenings and vaccines you need and how often you need them. ?This information is not intended to replace advice given to you by your health care provider. Make sure you discuss any questions you have with your health care provider. ?Document Released: 06/29/2015 Document Revised: 02/20/2016 Document Reviewed: 04/03/2015 ?Elsevier Interactive Patient Education ? 2017 [place]. ? ?Fall Prevention in the Home ?Falls can cause injuries. They can happen to people of all ages. There are many things you can do to make your home safe and to help prevent falls. ?What can I do on the outside of my home? ?Regularly fix the edges of walkways and driveways and fix any cracks. ?Remove anything that might make you trip as you walk through a door, such as a raised step or threshold. ?Trim any bushes or trees on the path to your home. ?Use bright outdoor lighting. ?Clear any walking paths of anything that might make someone trip, such as rocks or tools. ?Regularly check to see if handrails are loose or broken. Make sure that both sides of any steps have handrails. ?Any raised decks and porches should have guardrails on the edges. ?Have any leaves, snow, or ice cleared regularly. ?Use sand or salt on walking paths during winter. ?Clean up any spills in your garage right away. This includes oil or grease spills. ?What can I do in the bathroom? ?Use night lights. ?Install grab bars by the toilet and in the tub and shower. Do not use towel bars as grab bars. ?Use non-skid mats or decals in the tub or shower. ?If you need to sit down in the shower, use a plastic, non-slip stool. ?Keep the floor dry. Clean up any water that spills on the floor as soon as it happens. ?Remove soap buildup in the tub or shower  regularly. ?Attach bath mats securely with double-sided non-slip rug tape. ?Do not have throw rugs and other things on the floor that can make you trip. ?What can I do in the bedroom? ?Use night lights. ?Make sure that you have a light by your bed that is easy to reach. ?Do not use any sheets or blankets that are too big for your bed. They should not hang down onto the floor. ?Have a firm chair that has side arms. You can use this for support while you get dressed. ?Do not have throw rugs and other things on the floor that can make you trip. ?What can I do in the kitchen? ?Clean up any spills right away. ?Avoid walking on wet floors. ?Keep items that you use a lot in easy-to-reach places. ?If you need to reach something above you, use a strong step stool that has a grab bar. ?Keep electrical cords out of the way. ?Do not use floor polish or wax that makes floors slippery. If you must use wax, use non-skid floor wax. ?Do not have throw rugs and other things on the floor that can make you trip. ?What can I do with my stairs? ?Do not leave any items on the stairs. ?Make sure that there are handrails on both sides of the stairs and use them. Fix handrails that are broken or loose. Make sure that handrails are as long as the stairways. ?Check any carpeting to make sure that  it is firmly attached to the stairs. Fix any carpet that is loose or worn. ?Avoid having throw rugs at the top or bottom of the stairs. If you do have throw rugs, attach them to the floor with carpet tape. ?Make sure that you have a light switch at the top of the stairs and the bottom of the stairs. If you do not have them, ask someone to add them for you. ?What else can I do to help prevent falls? ?Wear shoes that: ?Do not have high heels. ?Have rubber bottoms. ?Are comfortable and fit you well. ?Are closed at the toe. Do not wear sandals. ?If you use a stepladder: ?Make sure that it is fully opened. Do not climb a closed stepladder. ?Make sure that  both sides of the stepladder are locked into place. ?Ask someone to hold it for you, if possible. ?Clearly mark and make sure that you can see: ?Any grab bars or handrails. ?First and last steps. ?Where the

## 2021-12-02 DIAGNOSIS — Z1231 Encounter for screening mammogram for malignant neoplasm of breast: Secondary | ICD-10-CM | POA: Diagnosis not present

## 2022-01-28 DIAGNOSIS — J209 Acute bronchitis, unspecified: Secondary | ICD-10-CM | POA: Diagnosis not present

## 2022-04-15 DIAGNOSIS — H35373 Puckering of macula, bilateral: Secondary | ICD-10-CM | POA: Diagnosis not present

## 2022-04-15 DIAGNOSIS — Z961 Presence of intraocular lens: Secondary | ICD-10-CM | POA: Diagnosis not present

## 2022-04-15 DIAGNOSIS — H34832 Tributary (branch) retinal vein occlusion, left eye, with macular edema: Secondary | ICD-10-CM | POA: Diagnosis not present

## 2022-04-15 DIAGNOSIS — H2511 Age-related nuclear cataract, right eye: Secondary | ICD-10-CM | POA: Diagnosis not present

## 2022-04-22 DIAGNOSIS — Z9889 Other specified postprocedural states: Secondary | ICD-10-CM | POA: Insufficient documentation

## 2022-04-23 DIAGNOSIS — H18453 Nodular corneal degeneration, bilateral: Secondary | ICD-10-CM | POA: Insufficient documentation

## 2022-04-23 DIAGNOSIS — H34832 Tributary (branch) retinal vein occlusion, left eye, with macular edema: Secondary | ICD-10-CM | POA: Diagnosis not present

## 2022-04-23 DIAGNOSIS — H35373 Puckering of macula, bilateral: Secondary | ICD-10-CM | POA: Diagnosis not present

## 2022-04-23 DIAGNOSIS — Z9889 Other specified postprocedural states: Secondary | ICD-10-CM | POA: Diagnosis not present

## 2022-04-23 DIAGNOSIS — H2511 Age-related nuclear cataract, right eye: Secondary | ICD-10-CM | POA: Diagnosis not present

## 2022-04-23 DIAGNOSIS — Z961 Presence of intraocular lens: Secondary | ICD-10-CM | POA: Diagnosis not present

## 2022-06-03 ENCOUNTER — Telehealth: Payer: Self-pay | Admitting: Nutrition

## 2022-08-18 NOTE — Telephone Encounter (Signed)
Opened in error

## 2022-09-17 ENCOUNTER — Ambulatory Visit (INDEPENDENT_AMBULATORY_CARE_PROVIDER_SITE_OTHER): Payer: Medicare Other | Admitting: Internal Medicine

## 2022-09-17 VITALS — BP 124/70 | HR 86 | Temp 98.1°F | Ht 62.0 in | Wt 132.0 lb

## 2022-09-17 DIAGNOSIS — E785 Hyperlipidemia, unspecified: Secondary | ICD-10-CM | POA: Diagnosis not present

## 2022-09-17 DIAGNOSIS — R7302 Impaired glucose tolerance (oral): Secondary | ICD-10-CM | POA: Diagnosis not present

## 2022-09-17 DIAGNOSIS — J309 Allergic rhinitis, unspecified: Secondary | ICD-10-CM | POA: Diagnosis not present

## 2022-09-17 DIAGNOSIS — E559 Vitamin D deficiency, unspecified: Secondary | ICD-10-CM

## 2022-09-17 DIAGNOSIS — E538 Deficiency of other specified B group vitamins: Secondary | ICD-10-CM | POA: Diagnosis not present

## 2022-09-17 LAB — CBC WITH DIFFERENTIAL/PLATELET
Basophils Absolute: 0 10*3/uL (ref 0.0–0.1)
Basophils Relative: 0.7 % (ref 0.0–3.0)
Eosinophils Absolute: 0.2 10*3/uL (ref 0.0–0.7)
Eosinophils Relative: 4 % (ref 0.0–5.0)
HCT: 43 % (ref 36.0–46.0)
Hemoglobin: 14.4 g/dL (ref 12.0–15.0)
Lymphocytes Relative: 21.5 % (ref 12.0–46.0)
Lymphs Abs: 1.1 10*3/uL (ref 0.7–4.0)
MCHC: 33.5 g/dL (ref 30.0–36.0)
MCV: 90.6 fl (ref 78.0–100.0)
Monocytes Absolute: 0.3 10*3/uL (ref 0.1–1.0)
Monocytes Relative: 6.6 % (ref 3.0–12.0)
Neutro Abs: 3.3 10*3/uL (ref 1.4–7.7)
Neutrophils Relative %: 67.2 % (ref 43.0–77.0)
Platelets: 266 10*3/uL (ref 150.0–400.0)
RBC: 4.75 Mil/uL (ref 3.87–5.11)
RDW: 12.9 % (ref 11.5–15.5)
WBC: 5 10*3/uL (ref 4.0–10.5)

## 2022-09-17 LAB — VITAMIN D 25 HYDROXY (VIT D DEFICIENCY, FRACTURES): VITD: 53.46 ng/mL (ref 30.00–100.00)

## 2022-09-17 LAB — URINALYSIS, ROUTINE W REFLEX MICROSCOPIC
Bilirubin Urine: NEGATIVE
Hgb urine dipstick: NEGATIVE
Ketones, ur: NEGATIVE
Leukocytes,Ua: NEGATIVE
Nitrite: NEGATIVE
RBC / HPF: NONE SEEN (ref 0–?)
Specific Gravity, Urine: <=1.005 — AB (ref 1.000–1.030)
Total Protein, Urine: NEGATIVE
Urine Glucose: NEGATIVE
Urobilinogen, UA: 0.2 (ref 0.0–1.0)
pH: 6.5 (ref 5.0–8.0)

## 2022-09-17 LAB — LIPID PANEL
Cholesterol: 249 mg/dL — ABNORMAL HIGH (ref 0–200)
HDL: 53.2 mg/dL (ref >39.00)
LDL Cholesterol: 174 mg/dL — ABNORMAL HIGH (ref 0–99)
NonHDL: 196.17
Total CHOL/HDL Ratio: 5
Triglycerides: 110 mg/dL (ref 0.0–149.0)
VLDL: 22 mg/dL (ref 0.0–40.0)

## 2022-09-17 LAB — HEPATIC FUNCTION PANEL
ALT: 10 U/L (ref 0–35)
AST: 18 U/L (ref 0–37)
Albumin: 4.4 g/dL (ref 3.5–5.2)
Alkaline Phosphatase: 60 U/L (ref 39–117)
Bilirubin, Direct: 0 mg/dL (ref 0.0–0.3)
Total Bilirubin: 0.2 mg/dL (ref 0.2–1.2)
Total Protein: 7 g/dL (ref 6.0–8.3)

## 2022-09-17 LAB — TSH: TSH: 2.3 u[IU]/mL (ref 0.35–5.50)

## 2022-09-17 LAB — BASIC METABOLIC PANEL
BUN: 11 mg/dL (ref 6–23)
CO2: 29 mEq/L (ref 19–32)
Calcium: 9.3 mg/dL (ref 8.4–10.5)
Chloride: 105 mEq/L (ref 96–112)
Creatinine, Ser: 0.81 mg/dL (ref 0.40–1.20)
GFR: 72.35 mL/min (ref >60.00)
Glucose, Bld: 85 mg/dL (ref 70–99)
Potassium: 3.8 mEq/L (ref 3.5–5.1)
Sodium: 144 mEq/L (ref 135–145)

## 2022-09-17 LAB — HEMOGLOBIN A1C: Hgb A1c MFr Bld: 6 % (ref 4.6–6.5)

## 2022-09-17 LAB — VITAMIN B12: Vitamin B-12: 408 pg/mL (ref 211–911)

## 2022-09-17 MED ORDER — SIMVASTATIN 40 MG PO TABS: 40.0000 mg | ORAL_TABLET | Freq: Every day | ORAL | 3 refills | Status: DC

## 2022-09-17 NOTE — Patient Instructions (Addendum)
Please call for your follow up colonoscopy as you mentioned  Please have your Shingrix (shingles) shots done at your local pharmacy, and the Tdap tetanus shot as well  Please also consider having the Prevnar 20 at the pharmacy  Please continue all other medications as before, and refills have been done if requested.  Please have the pharmacy call with any other refills you may need.  Please continue your efforts at being more active, low cholesterol diet, and weight control.  You are otherwise up to date with prevention measures today.  Please keep your appointments with your specialists as you may have planned  Please go to the LAB at the blood drawing area for the tests to be done  You will be contacted by phone if any changes need to be made immediately.  Otherwise, you will receive a letter about your results with an explanation, but please check with MyChart first.  Please remember to sign up for MyChart if you have not done so, as this will be important to you in the future with finding out test results, communicating by private email, and scheduling acute appointments online when needed.  Please make an Appointment to return for your 1 year visit, or sooner if needed

## 2022-09-17 NOTE — Progress Notes (Signed)
Patient ID: Jaime Young, female   DOB: [birth year], 72 y.o.   MRN: [id number]         Chief Complaint:: yearly exam       HPI:  Jaime Young is a 72 y.o. female overall doing ok.  Pt denies chest pain, increased sob or doe, wheezing, orthopnea, PND, increased LE swelling, palpitations, dizziness or syncope.   Pt denies polydipsia, polyuria, or new focal neuro s/s.    Pt denies fever, wt loss, night sweats, loss of appetite, or other constitutional symptoms  Tolerating zocor 40 mg.  Does have several wks ongoing nasal allergy symptoms with clearish congestion, itch and sneezing, without fever, pain, ST, cough, swelling or wheezing.  She plans to call for colonoscopy, and also for prevnar 20 at the pharmcy.     Wt Readings from Last 3 Encounters:  09/17/22 132 lb (59.9 kg)  09/12/21 131 lb (59.4 kg)  01/08/21 127 lb 14.4 oz (58 kg)   BP Readings from Last 3 Encounters:  09/17/22 124/70  09/12/21 112/68  01/08/21 129/67   Immunization History  Administered Date(s) Administered   Fluad Quad(high Dose 65+) 03/04/2019, 06/14/2020, 02/27/2021   Hepatitis A, Adult 05/24/2018   Influenza, High Dose Seasonal PF 04/18/2016   PFIZER Comirnaty(Gray Top)Covid-19 Tri-Sucrose Vaccine 08/06/2019, 08/29/2019, 04/14/2020   PFIZER(Purple Top)SARS-COV-2 Vaccination 08/06/2019, 08/29/2019, 04/14/2020   Pneumococcal Conjugate-13 08/25/2016   Pneumococcal Polysaccharide-23 08/27/2017   Zoster, Live 07/12/2012   Health Maintenance Due  Topic Date Due   DTaP/Tdap/Td (1 - Tdap) Never done   Zoster Vaccines- Shingrix (1 of 2) Never done   COLONOSCOPY (Pts 45-24yrs Insurance coverage will need to be confirmed)  02/01/2021   Medicare Annual Wellness (AWV)  09/20/2022      Past Medical History:  Diagnosis Date   Allergic rhinitis    Anxiety    Cancer    stage I right breast cancer   Diverticulitis    Diverticulosis of colon    DJD (degenerative joint disease) of lumbar spine    DVT (deep venous  thrombosis) 1977   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   Hyperlipidemia    Impaired glucose tolerance 11/17/2010   Migraine    Osteopenia 09/18/2010   Post-operative nausea and vomiting    RADIAL NERVE INJURY    Raynaud's disease    RAYNAUD'S DISEASE    SUPERFICIAL PHLEBITIS 09/01/2007   Past Surgical History:  Procedure Laterality Date   BACK SURGERY  2002   BREAST LUMPECTOMY Right 2011   axillary node dissection   BREAST SURGERY  1989/2001   implants   HEMORRHOID SURGERY  1983   PARS PLANA VITRECTOMY Left 2013   w/Membranectomy   TONSILLECTOMY     TOTAL ABDOMINAL HYSTERECTOMY W/ BILATERAL SALPINGOOPHORECTOMY  1983   secondary to edometriosis    reports that she has quit smoking. She has never used smokeless tobacco. She reports that she does not drink alcohol and does not use drugs. family history includes Bone cancer in an other family member; Breast cancer in an other family member; Cancer in her father and sister; Esophageal cancer in her father; Heart disease in her father; Hypertension in her mother; Stomach cancer in her sister and another family member; Stroke in her mother. Allergies  Allergen Reactions   Dextromethorphan Swelling   Ciprofloxacin Other (See Comments)    Pt denies   Codeine Nausea Only   Hydrocodone Nausea And Vomiting    Pt indicates N/V occurs  with all narcotics.    Penicillins Hives   Sulfonamide Derivatives Nausea Only   Aflibercept Rash   Current Outpatient Medications on File Prior to Visit  Medication Sig Dispense Refill   aspirin-acetaminophen-caffeine (EXCEDRIN MIGRAINE) 250-250-65 MG tablet Take 1 tablet by mouth as needed for headache.     brimonidine-timolol (COMBIGAN) 0.2-0.5 % ophthalmic solution Place 1 drop into the left eye 2 (two) times daily.      Calcium-Magnesium-Vitamin D 500-250-200 MG-MG-UNIT TABS Take 2 tablets by mouth 2 (two) times daily.      Multiple Vitamins tablet Take 1 tablet by mouth daily. Reported on  08/02/2015     Omega-3 Fatty Acids (FISH OIL) 300 MG CAPS Take 1 capsule by mouth daily.     timolol (TIMOPTIC) 0.5 % ophthalmic solution Place 1 drop into both eyes 2 (two) times daily.     vitamin C (ASCORBIC ACID) 500 MG tablet Take 1,000 mg by mouth daily.     No current facility-administered medications on file prior to visit.        ROS:  All others reviewed and negative.  Objective        PE:  BP 124/70 (BP Location: Left Arm, Patient Position: Sitting, Cuff Size: Normal)   Pulse 86   Temp 98.1 F (36.7 C) (Oral)   Ht 5\' 2"  (1.575 m)   Wt 132 lb (59.9 kg)   SpO2 98%   BMI 24.14 kg/m                 Constitutional: Pt appears in NAD               HENT: Head: NCAT.                Right Ear: External ear normal.                 Left Ear: External ear normal.                Eyes: . Pupils are equal, round, and reactive to light. Conjunctivae and EOM are normal               Nose: without d/c or deformity               Neck: Neck supple. Gross normal ROM               Cardiovascular: Normal rate and regular rhythm.                 Pulmonary/Chest: Effort normal and breath sounds without rales or wheezing.                Abd:  Soft, NT, ND, + BS, no organomegaly               Neurological: Pt is alert. At baseline orientation, motor grossly intact               Skin: Skin is warm. No rashes, no other new lesions, LE edema - none               Psychiatric: Pt behavior is normal without agitation   Micro: none  Cardiac tracings I have personally interpreted today:  none  Pertinent Radiological findings (summarize): none   Lab Results  Component Value Date   WBC 5.0 09/17/2022   HGB 14.4 09/17/2022   HCT 43.0 09/17/2022   PLT 266.0 09/17/2022   GLUCOSE 85 09/17/2022   CHOL 249 (H) 09/17/2022  TRIG 110.0 09/17/2022   HDL 53.20 09/17/2022   LDLDIRECT 160.6 12/20/2007   LDLCALC 174 (H) 09/17/2022   ALT 10 09/17/2022   AST 18 09/17/2022   NA 144 09/17/2022   K 3.8  09/17/2022   CL 105 09/17/2022   CREATININE 0.81 09/17/2022   BUN 11 09/17/2022   CO2 29 09/17/2022   TSH 2.30 09/17/2022   INR 1.00 02/04/2010   HGBA1C 6.0 09/17/2022   Assessment/Plan:  Jaime Young is a 72 y.o. White or Caucasian [1] female with  has a past medical history of Allergic rhinitis, Anxiety, Cancer, Diverticulitis, Diverticulosis of colon, DJD (degenerative joint disease) of lumbar spine, DVT (deep venous thrombosis) (1977), Hearing loss of both ears, Hyperlipidemia, Impaired glucose tolerance (11/17/2010), Migraine, Osteopenia (09/18/2010), Post-operative nausea and vomiting, RADIAL NERVE INJURY, Raynaud's disease, RAYNAUD'S DISEASE, and SUPERFICIAL PHLEBITIS (09/01/2007).  Hyperlipidemia Lab Results  Component Value Date   LDLCALC 135 (H) 09/12/2021   Uncontrolled, tolerating increased zocor 40 mg, to continue this and f/u lab today with goal ldl < 70   Allergic rhinitis Mild to mod, for restart otc allegra and nasacort asd,  to f/u any worsening symptoms or concerns  Impaired glucose tolerance Lab Results  Component Value Date   HGBA1C 6.0 09/17/2022   Stable, pt to continue current medical treatment  - diet,wt control  Followup: Return in about 1 year (around 09/17/2023).  Oliver Barre, MD 09/20/2022 5:25 PM [facility] [place]

## 2022-09-17 NOTE — Assessment & Plan Note (Addendum)
Lab Results  Component Value Date   LDLCALC 135 (H) 09/12/2021   Uncontrolled, tolerating increased zocor 40 mg, to continue this and f/u lab today with goal ldl < 70

## 2022-09-20 ENCOUNTER — Encounter: Payer: Self-pay | Admitting: Internal Medicine

## 2022-09-20 NOTE — Assessment & Plan Note (Signed)
Lab Results  Component Value Date   HGBA1C 6.0 09/17/2022   Stable, pt to continue current medical treatment  - diet, wt control 

## 2022-09-20 NOTE — Assessment & Plan Note (Signed)
Mild to mod, for restart otc allegra and nasacort asd,  to f/u any worsening symptoms or concerns

## 2022-10-09 ENCOUNTER — Telehealth: Payer: Self-pay | Admitting: Internal Medicine

## 2022-10-09 NOTE — Telephone Encounter (Signed)
Contacted Otila Back to schedule their annual wellness visit. Appointment made for 10/20/2022.  [facility] Care Guide [facility] AWV TEAM Direct Dial: [phone]

## 2022-10-20 ENCOUNTER — Ambulatory Visit (INDEPENDENT_AMBULATORY_CARE_PROVIDER_SITE_OTHER): Payer: Medicare Other

## 2022-10-20 VITALS — Ht 62.0 in | Wt 130.0 lb

## 2022-10-20 DIAGNOSIS — Z0000 Encounter for general adult medical examination without abnormal findings: Secondary | ICD-10-CM

## 2022-10-20 NOTE — Progress Notes (Signed)
I connected with  Jaime Young on 10/22/22 by a audio enabled telemedicine application and verified that I am speaking with the correct person using two identifiers.  Patient Location: Home  Provider Location: Office/Clinic  I discussed the limitations of evaluation and management by telemedicine. The patient expressed understanding and agreed to proceed.  Patient Medicare AWV questionnaire was completed by the patient on 10/17/2022; I have confirmed that all information answered by patient is correct and no changes since this date.    Subjective:   Jaime Young is a 72 y.o. female who presents for Medicare Annual (Subsequent) preventive examination.  Review of Systems     Cardiac Risk Factors include: advanced age (>87men, >39 women);dyslipidemia;family history of premature cardiovascular disease     Objective:    Today's Vitals   10/20/22 1342 10/20/22 1345  Weight: 130 lb (59 kg)   Height: 5\' 2"  (1.575 m)   PainSc: 0-No pain 0-No pain   Body mass index is 23.78 kg/m.     10/20/2022    1:47 PM 09/19/2021    3:34 PM 09/11/2020    8:49 AM 09/07/2018    8:57 AM 08/27/2017    8:56 AM 03/23/2017    3:49 AM 08/20/2016   10:24 AM  Advanced Directives  Does Patient Have a Medical Advance Directive? No No Yes Yes No No No  Type of Aeronautical engineer of [place];Living will     Does patient want to make changes to medical advance directive?   Yes (MAU/Ambulatory/Procedural Areas - Information given)  Yes (ED - Information included in AVS)    Copy of Healthcare Power of Attorney in Chart?    No - copy requested     Would patient like information on creating a medical advance directive? No - Patient declined No - Patient declined         Current Medications (verified) Outpatient Encounter Medications as of 10/20/2022  Medication Sig   aspirin-acetaminophen-caffeine (EXCEDRIN MIGRAINE) 250-250-65 MG tablet Take 1 tablet by mouth as needed for headache.    brimonidine-timolol (COMBIGAN) 0.2-0.5 % ophthalmic solution Place 1 drop into the left eye 2 (two) times daily.    Calcium-Magnesium-Vitamin D 500-250-200 MG-MG-UNIT TABS Take 2 tablets by mouth 2 (two) times daily.    Multiple Vitamins tablet Take 1 tablet by mouth daily. Reported on 08/02/2015   Omega-3 Fatty Acids (FISH OIL) 300 MG CAPS Take 1 capsule by mouth daily.   simvastatin (ZOCOR) 40 MG tablet Take 1 tablet (40 mg total) by mouth at bedtime.   timolol (TIMOPTIC) 0.5 % ophthalmic solution Place 1 drop into both eyes 2 (two) times daily.   vitamin C (ASCORBIC ACID) 500 MG tablet Take 1,000 mg by mouth daily.   No facility-administered encounter medications on file as of 10/20/2022.    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   Hyperlipidemia    Impaired glucose tolerance 11/17/2010   Migraine    Osteopenia 09/18/2010   Post-operative nausea and vomiting    RADIAL NERVE INJURY  Raynaud's disease    RAYNAUD'S DISEASE    SUPERFICIAL PHLEBITIS 09/01/2007   Past Surgical History:  Procedure Laterality Date   BACK SURGERY  2002   BREAST LUMPECTOMY Right 2011   axillary node dissection   BREAST SURGERY  1989/2001   implants   HEMORRHOID SURGERY  1983   PARS PLANA VITRECTOMY Left 2013   w/Membranectomy   TONSILLECTOMY     TOTAL ABDOMINAL HYSTERECTOMY W/ BILATERAL SALPINGOOPHORECTOMY  1983   secondary to edometriosis   Family History  Problem Relation Age of Onset   Stroke Mother    Hypertension Mother    Heart disease Father    Cancer Father    Esophageal cancer Father     Cancer Sister    Stomach cancer Sister    Breast cancer Other    Bone cancer Other    Stomach cancer Other    Colon cancer Neg Hx    Rectal cancer Neg Hx    Social History   Socioeconomic History   Marital status: Married    Spouse name: Not on file   Number of children: 1   Years of education: Not on file   Highest education level: Not on file  Occupational History   Occupation: ACCOUNT EXECUTIVE    Employer: [employer]   Occupation: Systems developer  Tobacco Use   Smoking status: Former   Smokeless tobacco: Never  Building services engineer Use: Never used  Substance and Sexual Activity   Alcohol use: No   Drug use: No   Sexual activity: Yes  Other Topics Concern   Not on file  Social History Narrative   Not on file   Social Determinants of Health   Financial Resource Strain: Low Risk  (10/20/2022)   Overall Financial Resource Strain (CARDIA)    Difficulty of Paying Living Expenses: Not hard at all  Food Insecurity: No Food Insecurity (10/20/2022)   Hunger Vital Sign    Worried About Running Out of Food in the Last Year: Never true    Ran Out of Food in the Last Year: Never true  Transportation Needs: No Transportation Needs (10/20/2022)   PRAPARE - Administrator, Civil Service (Medical): No    Lack of Transportation (Non-Medical): No  Physical Activity: Insufficiently Active (10/20/2022)   Exercise Vital Sign    Days of Exercise per Week: 3 days    Minutes of Exercise per Session: 30 min  Stress: No Stress Concern Present (10/20/2022)   [employer] of Occupational Health - Occupational Stress Questionnaire    Feeling of Stress : Not at all  Social Connections: Unknown (10/20/2022)   Social Connection and Isolation Panel [NHANES]    Frequency of Communication with Friends and Family: Once a week    Frequency of Social Gatherings with Friends and Family: Three times a week    Attends Religious Services: Not on file    Active Member of Clubs or Organizations:  Yes    Attends Banker Meetings: More than 4 times per year    Marital Status: Married    Tobacco Counseling Counseling given: Not Answered   Clinical Intake:  Pre-visit preparation completed: Yes  Pain : No/denies pain Pain Score: 0-No pain     BMI - recorded: 23.78 Nutritional Status: BMI of 19-24  Normal Nutritional Risks: None Diabetes: No  How often do you need to have someone help you when you read instructions, pamphlets, or other written materials from your doctor or pharmacy?:  1 - Never What is the last grade level you completed in school?: Account Executive  Diabetic? No  Interpreter Needed?: No  Information entered by :: Susie Cassette, LPN.   Activities of Daily Living    10/20/2022    1:51 PM 10/17/2022    4:54 PM  In your present state of health, do you have any difficulty performing the following activities:  Hearing? 0 0  Vision? 0 0  Difficulty concentrating or making decisions? 0 0  Walking or climbing stairs? 0 0  Dressing or bathing? 0 0  Doing errands, shopping? 0 0  Preparing Food and eating ? N N  Using the Toilet? N N  In the past six months, have you accidently leaked urine? Y Y  Do you have problems with loss of bowel control? N N  Managing your Medications? N N  Managing your Finances? N N  Housekeeping or managing your Housekeeping? N N    Patient Care Team: Corwin Levins, MD as PCP - General Yolanda Bonine Elpidio Anis, MD (Radiology) Magrinat, Valentino Hue, MD (Inactive) as Consulting Physician (Oncology) Holli Humbles, MD as Referring Physician (Ophthalmology)  Indicate any recent Medical Services you may have received from other than [facility] providers in the past year (date may be approximate).     Assessment:   This is a routine wellness examination for Lesta.  Hearing/Vision screen Hearing Screening - Comments:: Patient wears hearing aids. Vision Screening - Comments:: Wears rx glasses - up to date with  routine eye exams with [facility]   Dietary issues and exercise activities discussed: Current Exercise Habits: Home exercise routine, Type of exercise: walking, Time (Minutes): 30, Frequency (Times/Week): 3, Weekly Exercise (Minutes/Week): 90, Intensity: Moderate, Exercise limited by: None identified   Goals Addressed             This Visit's Progress    "Too bless to be in mess" I am truly blessed in my life.        Depression Screen    10/22/2022    9:14 AM 09/17/2022    9:03 AM 09/19/2021    3:39 PM 09/12/2021    9:59 AM 09/12/2021    9:14 AM 09/11/2020    9:20 AM 09/11/2020    8:47 AM  PHQ 2/9 Scores  PHQ - 2 Score 0 0 0 0 0 0 0  PHQ- 9 Score 0          Fall Risk    10/20/2022    1:47 PM 10/17/2022    4:54 PM 09/17/2022    9:03 AM 09/19/2021    3:35 PM 09/12/2021    9:58 AM  Fall Risk   Falls in the past year? 0 0 0 0 0  Number falls in past yr: 0 0 0 0 0  Injury with Fall? 0 0 0 0 0  Risk for fall due to : No Fall Risks  No Fall Risks No Fall Risks   Follow up Falls prevention discussed  Falls evaluation completed Falls evaluation completed     FALL RISK PREVENTION PERTAINING TO THE HOME:  Any stairs in or around the home? No  If so, are there any without handrails? No  Home free of loose throw rugs in walkways, pet beds, electrical cords, etc? Yes  Adequate lighting in your home to reduce risk of falls? Yes   ASSISTIVE DEVICES UTILIZED TO PREVENT FALLS:  Life alert? No  Use of a cane, walker or w/c? No  Grab bars in the bathroom? Yes  Shower chair or bench in shower? Yes  Elevated toilet seat or a handicapped toilet? Yes   TIMED UP AND GO:  Was the test performed? No . Telephonic Visit  Cognitive Function:        10/20/2022    1:48 PM 09/19/2021    3:44 PM  6CIT Screen  What Year? 0 points 0 points  What month? 0 points 0 points  What time? 0 points 0 points  Count back from 20 0 points 0 points  Months in reverse 0 points 0  points  Repeat phrase 0 points 0 points  Total Score 0 points 0 points    Immunizations Immunization History  Administered Date(s) Administered   Fluad Quad(high Dose 65+) 03/04/2019, 06/14/2020, 02/27/2021   Hepatitis A, Adult 05/24/2018   Influenza, High Dose Seasonal PF 04/18/2016   PFIZER Comirnaty(Gray Top)Covid-19 Tri-Sucrose Vaccine 08/06/2019, 08/29/2019, 04/14/2020   PFIZER(Purple Top)SARS-COV-2 Vaccination 08/06/2019, 08/29/2019, 04/14/2020   Pneumococcal Conjugate-13 08/25/2016   Pneumococcal Polysaccharide-23 08/27/2017   Zoster, Live 07/12/2012    TDAP status: Up to date  Flu Vaccine status: Due, Education has been provided regarding the importance of this vaccine. Advised may receive this vaccine at local pharmacy or Health Dept. Aware to provide a copy of the vaccination record if obtained from local pharmacy or Health Dept. Verbalized acceptance and understanding.  Pneumococcal vaccine status: Up to date  Covid-19 vaccine status: Completed vaccines  Qualifies for Shingles Vaccine? Yes   Zostavax completed Yes   Shingrix Completed?: No.    Education has been provided regarding the importance of this vaccine. Patient has been advised to call insurance company to determine out of pocket expense if they have not yet received this vaccine. Advised may also receive vaccine at local pharmacy or Health Dept. Verbalized acceptance and understanding.  Screening Tests Health Maintenance  Topic Date Due   DTaP/Tdap/Td (1 - Tdap) Never done   Zoster Vaccines- Shingrix (1 of 2) Never done   COLONOSCOPY (Pts 45-52yrs Insurance coverage will need to be confirmed)  02/01/2021   COVID-19 Vaccine (7 - 2023-24 season) 02/14/2022   MAMMOGRAM  11/27/2022   INFLUENZA VACCINE  01/15/2023   Medicare Annual Wellness (AWV)  10/20/2023   Pneumonia Vaccine 5+ Years old  Completed   DEXA SCAN  Completed   Hepatitis C Screening  Completed   HPV VACCINES  Aged Out    Health  Maintenance  Health Maintenance Due  Topic Date Due   DTaP/Tdap/Td (1 - Tdap) Never done   Zoster Vaccines- Shingrix (1 of 2) Never done   COLONOSCOPY (Pts 45-73yrs Insurance coverage will need to be confirmed)  02/01/2021   COVID-19 Vaccine (7 - 2023-24 season) 02/14/2022    Colorectal cancer screening: Type of screening: Colonoscopy. Completed 02/01/2014. Repeat every 7 years  Mammogram status: Completed 11/2021. Repeat every year  Bone Density status: Never done  Lung Cancer Screening: (Low Dose CT Chest recommended if Age 4-80 years, 30 pack-year currently smoking OR have quit w/in 15years.) does not qualify.   Lung Cancer Screening Referral: No  Additional Screening:  Hepatitis C Screening: does qualify; Completed 12/07/2006  Vision Screening: Recommended annual ophthalmology exams for early detection of glaucoma and other disorders of the eye. Is the patient up to date with their annual eye exam?  Yes  Who is the provider or what is the name of the office in which the patient attends annual eye exams? Hillery Aldo, MD. If pt is  not established with a provider, would they like to be referred to a provider to establish care? No .   Dental Screening: Recommended annual dental exams for proper oral hygiene  Community Resource Referral / Chronic Care Management: CRR required this visit?  No   CCM required this visit?  No      Plan:     I have personally reviewed and noted the following in the patient's chart:   Medical and social history Use of alcohol, tobacco or illicit drugs  Current medications and supplements including opioid prescriptions. Patient is not currently taking opioid prescriptions. Functional ability and status Nutritional status Physical activity Advanced directives List of other physicians Hospitalizations, surgeries, and ER visits in previous 12 months Vitals Screenings to include cognitive, depression, and falls Referrals and  appointments  In addition, I have reviewed and discussed with patient certain preventive protocols, quality metrics, and best practice recommendations. A written personalized care plan for preventive services as well as general preventive health recommendations were provided to patient.     Mickeal Needy, LPN   06/21/1094   Nurse Notes:  Normal cognitive status assessed by direct observation via telephone conversation by this Nurse Health Advisor. No abnormalities found.  Depression screening was completed during the visit on 10/20/2022.

## 2022-10-20 NOTE — Patient Instructions (Addendum)
Ms. Jaime Young , Thank you for taking time to come for your Medicare Wellness Visit. I appreciate your ongoing commitment to your health goals. Please review the following plan we discussed and let me know if I can assist you in the future.   These are the goals we discussed:  Goals      "Too bless to be in mess" I am truly blessed in my life.        This is a list of the screening recommended for you and due dates:  Health Maintenance  Topic Date Due   DTaP/Tdap/Td vaccine (1 - Tdap) Never done   Zoster (Shingles) Vaccine (1 of 2) Never done   Colon Cancer Screening  02/01/2021   COVID-19 Vaccine (7 - 2023-24 season) 02/14/2022   Mammogram  11/27/2022   Flu Shot  01/15/2023   Medicare Annual Wellness Visit  10/20/2023   Pneumonia Vaccine  Completed   DEXA scan (bone density measurement)  Completed   Hepatitis C Screening: USPSTF Recommendation to screen - Ages 18-79 yo.  Completed   HPV Vaccine  Aged Out    Advanced directives: No; In the process of updating.  Conditions/risks identified: Yes  Next appointment: Follow up in one year for your annual wellness visit.   Preventive Care 57 Years and Older, Female Preventive care refers to lifestyle choices and visits with your health care provider that can promote health and wellness. What does preventive care include? A yearly physical exam. This is also called an annual well check. Dental exams once or twice a year. Routine eye exams. Ask your health care provider how often you should have your eyes checked. Personal lifestyle choices, including: Daily care of your teeth and gums. Regular physical activity. Eating a healthy diet. Avoiding tobacco and drug use. Limiting alcohol use. Practicing safe sex. Taking low-dose aspirin every day. Taking vitamin and mineral supplements as recommended by your health care provider. What happens during an annual well check? The services and screenings done by your health care provider  during your annual well check will depend on your age, overall health, lifestyle risk factors, and family history of disease. Counseling  Your health care provider may ask you questions about your: Alcohol use. Tobacco use. Drug use. Emotional well-being. Home and relationship well-being. Sexual activity. Eating habits. History of falls. Memory and ability to understand (cognition). Work and work Astronomer. Reproductive health. Screening  You may have the following tests or measurements: Height, weight, and BMI. Blood pressure. Lipid and cholesterol levels. These may be checked every 5 years, or more frequently if you are over 74 years old. Skin check. Lung cancer screening. You may have this screening every year starting at age 53 if you have a 30-pack-year history of smoking and currently smoke or have quit within the past 15 years. Fecal occult blood test (FOBT) of the stool. You may have this test every year starting at age 99. Flexible sigmoidoscopy or colonoscopy. You may have a sigmoidoscopy every 5 years or a colonoscopy every 10 years starting at age 75. Hepatitis C blood test. Hepatitis B blood test. Sexually transmitted disease (STD) testing. Diabetes screening. This is done by checking your blood sugar (glucose) after you have not eaten for a while (fasting). You may have this done every 1-3 years. Bone density scan. This is done to screen for osteoporosis. You may have this done starting at age 19. Mammogram. This may be done every 1-2 years. Talk to your health care provider about  how often you should have regular mammograms. Talk with your health care provider about your test results, treatment options, and if necessary, the need for more tests. Vaccines  Your health care provider may recommend certain vaccines, such as: Influenza vaccine. This is recommended every year. Tetanus, diphtheria, and acellular pertussis (Tdap, Td) vaccine. You may need a Td booster every  10 years. Zoster vaccine. You may need this after age 39. Pneumococcal 13-valent conjugate (PCV13) vaccine. One dose is recommended after age 102. Pneumococcal polysaccharide (PPSV23) vaccine. One dose is recommended after age 81. Talk to your health care provider about which screenings and vaccines you need and how often you need them. This information is not intended to replace advice given to you by your health care provider. Make sure you discuss any questions you have with your health care provider. Document Released: 06/29/2015 Document Revised: 02/20/2016 Document Reviewed: 04/03/2015 Elsevier Interactive Patient Education  2017 [employer] Prevention in the Home Falls can cause injuries. They can happen to people of all ages. There are many things you can do to make your home safe and to help prevent falls. What can I do on the outside of my home? Regularly fix the edges of walkways and driveways and fix any cracks. Remove anything that might make you trip as you walk through a door, such as a raised step or threshold. Trim any bushes or trees on the path to your home. Use bright outdoor lighting. Clear any walking paths of anything that might make someone trip, such as rocks or tools. Regularly check to see if handrails are loose or broken. Make sure that both sides of any steps have handrails. Any raised decks and porches should have guardrails on the edges. Have any leaves, snow, or ice cleared regularly. Use sand or salt on walking paths during winter. Clean up any spills in your garage right away. This includes oil or grease spills. What can I do in the bathroom? Use night lights. Install grab bars by the toilet and in the tub and shower. Do not use towel bars as grab bars. Use non-skid mats or decals in the tub or shower. If you need to sit down in the shower, use a plastic, non-slip stool. Keep the floor dry. Clean up any water that spills on the floor as soon as it  happens. Remove soap buildup in the tub or shower regularly. Attach bath mats securely with double-sided non-slip rug tape. Do not have throw rugs and other things on the floor that can make you trip. What can I do in the bedroom? Use night lights. Make sure that you have a light by your bed that is easy to reach. Do not use any sheets or blankets that are too big for your bed. They should not hang down onto the floor. Have a firm chair that has side arms. You can use this for support while you get dressed. Do not have throw rugs and other things on the floor that can make you trip. What can I do in the kitchen? Clean up any spills right away. Avoid walking on wet floors. Keep items that you use a lot in easy-to-reach places. If you need to reach something above you, use a strong step stool that has a grab bar. Keep electrical cords out of the way. Do not use floor polish or wax that makes floors slippery. If you must use wax, use non-skid floor wax. Do not have throw rugs and other  things on the floor that can make you trip. What can I do with my stairs? Do not leave any items on the stairs. Make sure that there are handrails on both sides of the stairs and use them. Fix handrails that are broken or loose. Make sure that handrails are as long as the stairways. Check any carpeting to make sure that it is firmly attached to the stairs. Fix any carpet that is loose or worn. Avoid having throw rugs at the top or bottom of the stairs. If you do have throw rugs, attach them to the floor with carpet tape. Make sure that you have a light switch at the top of the stairs and the bottom of the stairs. If you do not have them, ask someone to add them for you. What else can I do to help prevent falls? Wear shoes that: Do not have high heels. Have rubber bottoms. Are comfortable and fit you well. Are closed at the toe. Do not wear sandals. If you use a stepladder: Make sure that it is fully opened.  Do not climb a closed stepladder. Make sure that both sides of the stepladder are locked into place. Ask someone to hold it for you, if possible. Clearly mark and make sure that you can see: Any grab bars or handrails. First and last steps. Where the edge of each step is. Use tools that help you move around (mobility aids) if they are needed. These include: Canes. Walkers. Scooters. Crutches. Turn on the lights when you go into a dark area. Replace any light bulbs as soon as they burn out. Set up your furniture so you have a clear path. Avoid moving your furniture around. If any of your floors are uneven, fix them. If there are any pets around you, be aware of where they are. Review your medicines with your doctor. Some medicines can make you feel dizzy. This can increase your chance of falling. Ask your doctor what other things that you can do to help prevent falls. This information is not intended to replace advice given to you by your health care provider. Make sure you discuss any questions you have with your health care provider. Document Released: 03/29/2009 Document Revised: 11/08/2015 Document Reviewed: 07/07/2014 Elsevier Interactive Patient Education  2017 [employer].

## 2022-12-08 LAB — HM MAMMOGRAPHY

## 2022-12-10 ENCOUNTER — Encounter: Payer: Self-pay | Admitting: Internal Medicine

## 2023-04-30 ENCOUNTER — Encounter: Payer: Self-pay | Admitting: Gastroenterology

## 2023-06-11 ENCOUNTER — Ambulatory Visit (INDEPENDENT_AMBULATORY_CARE_PROVIDER_SITE_OTHER): Payer: Medicare Other | Admitting: Internal Medicine

## 2023-06-11 VITALS — BP 124/80 | HR 76 | Temp 98.1°F | Ht 62.0 in | Wt 137.0 lb

## 2023-06-11 DIAGNOSIS — E785 Hyperlipidemia, unspecified: Secondary | ICD-10-CM

## 2023-06-11 DIAGNOSIS — J309 Allergic rhinitis, unspecified: Secondary | ICD-10-CM | POA: Diagnosis not present

## 2023-06-11 DIAGNOSIS — L989 Disorder of the skin and subcutaneous tissue, unspecified: Secondary | ICD-10-CM | POA: Diagnosis not present

## 2023-06-11 DIAGNOSIS — R7302 Impaired glucose tolerance (oral): Secondary | ICD-10-CM

## 2023-06-11 MED ORDER — SIMVASTATIN 40 MG PO TABS: 40.0000 mg | ORAL_TABLET | ORAL | 3 refills | Status: DC

## 2023-06-11 NOTE — Patient Instructions (Addendum)
Ok to decrease the Simvastatin to every other day  Please continue all other medications as before, and refills have been done if requested.  Please have the pharmacy call with any other refills you may need.  Please continue your efforts at being more active, low cholesterol diet, and weight control.  You are otherwise up to date with prevention measures today.  Please keep your appointments with your specialists as you may have planned  You will be contacted regarding the referral for: [facility]  Please make an Appointment to return in 3 months, or sooner if needed

## 2023-06-11 NOTE — Progress Notes (Signed)
Patient ID: Jaime Young, female   DOB: 09-19-1950, 71 y.o.   MRN: [id number]        Chief Complaint: follow up left leg skin lesion, hld, hyperglycemia, allergies       HPI:  Jaime Young is a 72 y.o. female here with c/o 3 mo onset gradually worseing somewhat tender mid left medial leg lesion now over 10 mm.  Also has myalgias to taking zocor daily after about 10 days, so not sure whe wants to keep taking it.  Pt denies chest pain, increased sob or doe, wheezing, orthopnea, PND, increased LE swelling, palpitations, dizziness or syncope.   Pt denies polydipsia, polyuria, or new focal neuro s/s.    Pt denies fever, wt loss, night sweats, loss of appetite, or other constitutional symptoms  Does have several wks ongoing nasal allergy symptoms with clearish congestion, itch and sneezing, without fever, pain, ST, cough, swelling or wheezing.       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   Hyperlipidemia    Impaired glucose tolerance 11/17/2010   Migraine    Osteopenia 09/18/2010   Post-operative nausea and vomiting    RADIAL NERVE INJURY    Raynaud's disease    RAYNAUD'S DISEASE    SUPERFICIAL PHLEBITIS 09/01/2007   Past Surgical History:  Procedure Laterality Date   BACK SURGERY  2002   BREAST LUMPECTOMY Right 2011   axillary node dissection   BREAST SURGERY  1989/2001   implants   HEMORRHOID SURGERY  1983   PARS PLANA VITRECTOMY Left 2013   w/Membranectomy   TONSILLECTOMY     TOTAL ABDOMINAL HYSTERECTOMY  W/ BILATERAL SALPINGOOPHORECTOMY  1983   secondary to edometriosis    reports that she has quit smoking. She has never used smokeless tobacco. She reports that she does not drink alcohol and does not use drugs. family history includes Bone cancer in an other family member; Breast cancer in an other family member; Cancer in her father and sister; Esophageal cancer in her father; Heart disease in her father; Hypertension in her mother; Stomach cancer in her sister and another family member; Stroke in her mother. Allergies  Allergen Reactions   Dextromethorphan Swelling   Ciprofloxacin Other (See Comments)    Pt denies   Codeine Nausea Only   Hydrocodone Nausea And Vomiting    Pt indicates N/V occurs with all narcotics.    Penicillins Hives   Sulfonamide Derivatives Nausea Only   Aflibercept Rash   Current Outpatient Medications on File Prior to Visit  Medication Sig Dispense Refill   aspirin-acetaminophen-caffeine (EXCEDRIN MIGRAINE) 250-250-65 MG tablet Take 1 tablet by mouth as needed for headache.     brimonidine-timolol (COMBIGAN) 0.2-0.5 % ophthalmic solution Place 1 drop into the left eye 2 (two) times daily.      Calcium-Magnesium-Vitamin D 500-250-200 MG-MG-UNIT TABS Take 2 tablets by  mouth 2 (two) times daily.      Multiple Vitamins tablet Take 1 tablet by mouth daily. Reported on 08/02/2015     Omega-3 Fatty Acids (FISH OIL) 300 MG CAPS Take 1 capsule by mouth daily.     timolol (TIMOPTIC) 0.5 % ophthalmic solution Place 1 drop into both eyes 2 (two) times daily.     vitamin C (ASCORBIC ACID) 500 MG tablet Take 1,000 mg by mouth daily.     No current facility-administered medications on file prior to visit.        ROS:  All others reviewed and negative.  Objective        PE:  BP 124/80 (BP Location: Left Arm, Patient Position: Sitting, Cuff Size: Normal)   Pulse 76   Temp 98.1 F (36.7 C) (Oral)   Ht 5\' 2"  (1.575 m)   Wt 137 lb (62.1 kg)   SpO2 96%   BMI 25.06 kg/m                  Constitutional: Pt appears in NAD               HENT: Head: NCAT.                Right Ear: External ear normal.                 Left Ear: External ear normal.                Eyes: . Pupils are equal, round, and reactive to light. Conjunctivae and EOM are normal               Nose: without d/c or deformity               Neck: Neck supple. Gross normal ROM               Cardiovascular: Normal rate and regular rhythm.                 Pulmonary/Chest: Effort normal and breath sounds without rales or wheezing.                Abd:  Soft, NT, ND, + BS, no organomegaly               Neurological: Pt is alert. At baseline orientation, motor grossly intact               Skin: Skin is warm. , left medial mid leg with 12 mm dark raised lesion with irreg edges,  LE edema - none               Psychiatric: Pt behavior is normal without agitation   Micro: none  Cardiac tracings I have personally interpreted today:  none  Pertinent Radiological findings (summarize): none   Lab Results  Component Value Date   WBC 5.0 09/17/2022   HGB 14.4 09/17/2022   HCT 43.0 09/17/2022   PLT 266.0 09/17/2022   GLUCOSE 85 09/17/2022   CHOL 249 (H) 09/17/2022   TRIG 110.0 09/17/2022   HDL 53.20 09/17/2022   LDLDIRECT 160.6 12/20/2007   LDLCALC 174 (H) 09/17/2022   ALT 10 09/17/2022   AST 18 09/17/2022   NA 144 09/17/2022   K 3.8 09/17/2022   CL 105 09/17/2022   CREATININE 0.81 09/17/2022   BUN 11 09/17/2022   CO2 29 09/17/2022   TSH 2.30 09/17/2022   INR 1.00 02/04/2010   HGBA1C 6.0 09/17/2022  Assessment/Plan:  Jaime Young is a 72 y.o. White or Caucasian [1] female with  has a past medical history of Allergic rhinitis, Anxiety, Cancer (HCC), Diverticulitis, Diverticulosis of colon, DJD (degenerative joint disease) of lumbar spine, DVT (deep venous thrombosis) (HCC) (1977), Hearing loss of both ears, Hyperlipidemia, Impaired glucose tolerance (11/17/2010), Migraine, Osteopenia  (09/18/2010), Post-operative nausea and vomiting, RADIAL NERVE INJURY, Raynaud's disease, RAYNAUD'S DISEASE, and SUPERFICIAL PHLEBITIS (09/01/2007).  Hyperlipidemia Lab Results  Component Value Date   LDLCALC 174 (H) 09/17/2022   Uncontrolled, unable to tolerate daily zocor 40 mg, ok for change to 40 mg every other day   Allergic rhinitis Mild to mod, for otc nasacort asd,  to f/u any worsening symptoms or concerns  Impaired glucose tolerance Lab Results  Component Value Date   HGBA1C 6.0 09/17/2022   Stable, pt to continue current medical treatment - diet, wt control   Skin lesion of left leg New onset, for derm referral can't r/o malignancy  Followup: Return in about 3 months (around 09/09/2023).  Oliver Barre, MD 06/13/2023 7:38 PM [facility]

## 2023-06-13 ENCOUNTER — Encounter: Payer: Self-pay | Admitting: Internal Medicine

## 2023-06-13 NOTE — Assessment & Plan Note (Signed)
New onset, for derm referral can't r/o malignancy

## 2023-06-13 NOTE — Assessment & Plan Note (Signed)
Lab Results  Component Value Date   LDLCALC 174 (H) 09/17/2022   Uncontrolled, unable to tolerate daily zocor 40 mg, ok for change to 40 mg every other day

## 2023-06-13 NOTE — Assessment & Plan Note (Signed)
Lab Results  Component Value Date   HGBA1C 6.0 09/17/2022   Stable, pt to continue current medical treatment  - diet, wt control 

## 2023-06-13 NOTE — Assessment & Plan Note (Signed)
Mild to mod, for otc nasacort asd, to f/u any worsening symptoms or concerns ?

## 2023-10-22 ENCOUNTER — Telehealth: Payer: Self-pay | Admitting: Internal Medicine

## 2023-10-22 ENCOUNTER — Ambulatory Visit: Payer: Medicare Other

## 2023-10-22 VITALS — Ht 62.0 in | Wt 132.0 lb

## 2023-10-22 DIAGNOSIS — K635 Polyp of colon: Secondary | ICD-10-CM

## 2023-10-22 DIAGNOSIS — Z0000 Encounter for general adult medical examination without abnormal findings: Secondary | ICD-10-CM | POA: Diagnosis not present

## 2023-10-22 NOTE — Addendum Note (Signed)
 Addended by: Patria Bookbinder on: 10/22/2023 11:24 AM   Modules accepted: Orders

## 2023-10-22 NOTE — Patient Instructions (Signed)
 Ms. Gieseking , Thank you for taking time to come for your Medicare Wellness Visit. I appreciate your ongoing commitment to your health goals. Please review the following plan we discussed and let me know if I can assist you in the future.   Referrals/Orders/Follow-Ups/Clinician Recommendations: Aim for 30 minutes of exercise or brisk walking, 6-8 glasses of water, and 5 servings of fruits and vegetables each day. Patient will schedule a repeat Colonoscopy w/[facility].  Educated and advised on getting the Shingles, COVID, and Tdap (Tetenus) vaccines in 2025.  This is a list of the screening recommended for you and due dates:  Health Maintenance  Topic Date Due   DTaP/Tdap/Td vaccine (1 - Tdap) Never done   Zoster (Shingles) Vaccine (1 of 2) 12/15/1968   Colon Cancer Screening  02/01/2021   COVID-19 Vaccine (4 - 2024-25 season) 02/15/2023   Flu Shot  01/15/2024   Medicare Annual Wellness Visit  10/21/2024   Mammogram  12/07/2024   Pneumonia Vaccine  Completed   DEXA scan (bone density measurement)  Completed   Hepatitis C Screening  Completed   HPV Vaccine  Aged Out   Meningitis B Vaccine  Aged Out    Advanced directives: (Provided) Advance directive discussed with you today. I have provided a copy for you to complete at home and have notarized. Once this is complete, please bring a copy in to our office so we can scan it into your chart.   Next Medicare Annual Wellness Visit scheduled for next year: Yes

## 2023-10-22 NOTE — Progress Notes (Addendum)
 Subjective:   Jaime Young is a 73 y.o. who presents for a Medicare Wellness preventive visit.  Visit Complete: Virtual I connected with  Fernande Howells on 10/22/23 by a audio enabled telemedicine application and verified that I am speaking with the correct person using two identifiers.  Patient Location: Home  Provider Location: Office/Clinic  I discussed the limitations of evaluation and management by telemedicine. The patient expressed understanding and agreed to proceed.  Vital Signs: Because this visit was a virtual/telehealth visit, some criteria may be missing or patient reported. Any vitals not documented were not able to be obtained and vitals that have been documented are patient reported.  VideoDeclined- This patient declined Librarian, academic. Therefore the visit was completed with audio only.  Persons Participating in Visit: Patient.  AWV Questionnaire: No: Patient Medicare AWV questionnaire was not completed prior to this visit.  Cardiac Risk Factors include: advanced age (>29men, >20 women);dyslipidemia     Objective:    Today's Vitals   10/22/23 1014  Weight: 132 lb (59.9 kg)  Height: 5\' 2"  (1.575 m)   Body mass index is 24.14 kg/m.     10/22/2023   10:13 AM 10/20/2022    1:47 PM 09/19/2021    3:34 PM 09/11/2020    8:49 AM 09/07/2018    8:57 AM 08/27/2017    8:56 AM 03/23/2017    3:49 AM  Advanced Directives  Does Patient Have a Medical Advance Directive? No No No Yes Yes No No  Type of Merchandiser, retail of [place];Living will    Does patient want to make changes to medical advance directive?    Yes (MAU/Ambulatory/Procedural Areas - Information given)  Yes (ED - Information included in AVS)   Copy of Healthcare Power of Attorney in Chart?     No - copy requested    Would patient like information on creating a medical advance directive? Yes (MAU/Ambulatory/Procedural Areas - Information given) No -  Patient declined No - Patient declined        Current Medications (verified) Outpatient Encounter Medications as of 10/22/2023  Medication Sig   aspirin-acetaminophen-caffeine (EXCEDRIN MIGRAINE) 250-250-65 MG tablet Take 1 tablet by mouth as needed for headache.   brimonidine -timolol  (COMBIGAN) 0.2-0.5 % ophthalmic solution Place 1 drop into the left eye 2 (two) times daily.    Calcium -Magnesium-Vitamin D  500-250-200 MG-MG-UNIT TABS Take 2 tablets by mouth 2 (two) times daily.    Multiple Vitamins tablet Take 1 tablet by mouth daily. Reported on 08/02/2015   Omega-3 Fatty Acids (FISH OIL) 300 MG CAPS Take 1 capsule by mouth daily.   simvastatin  (ZOCOR ) 40 MG tablet Take 1 tablet (40 mg total) by mouth every other day.   timolol  (TIMOPTIC ) 0.5 % ophthalmic solution Place 1 drop into both eyes 2 (two) times daily.   vitamin C (ASCORBIC ACID) 500 MG tablet Take 1,000 mg by mouth daily.   No facility-administered encounter medications on file as of 10/22/2023.    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  Hyperlipidemia    Impaired glucose tolerance 11/17/2010   Migraine    Osteopenia 09/18/2010   Post-operative nausea and vomiting    RADIAL NERVE INJURY    Raynaud's disease    RAYNAUD'S DISEASE    SUPERFICIAL PHLEBITIS 09/01/2007   Past Surgical History:  Procedure Laterality Date   BACK SURGERY  2002   BREAST LUMPECTOMY Right 2011   axillary node dissection   BREAST SURGERY  1989/2001   implants   HEMORRHOID SURGERY  1983   PARS PLANA VITRECTOMY Left 2013   w/Membranectomy    TONSILLECTOMY     TOTAL ABDOMINAL HYSTERECTOMY W/ BILATERAL SALPINGOOPHORECTOMY  1983   secondary to edometriosis   Family History  Problem Relation Age of Onset   Stroke Mother    Hypertension Mother    Heart disease Father    Cancer Father    Esophageal cancer Father    Cancer Sister    Stomach cancer Sister    Breast cancer Other    Bone cancer Other    Stomach cancer Other    Colon cancer Neg Hx    Rectal cancer Neg Hx    Social History   Socioeconomic History   Marital status: Married    Spouse name: Not on file   Number of children: 1   Years of education: Not on file   Highest education level: Not on file  Occupational History   Occupation: ACCOUNT EXECUTIVE    Employer: [employer]   Occupation: Systems developer  Tobacco Use   Smoking status: Never    Passive exposure: Never   Smokeless tobacco: Never  Vaping Use   Vaping status: Never Used  Substance and Sexual Activity   Alcohol use: No   Drug use: No   Sexual activity: Yes  Other Topics Concern   Not on file  Social History Narrative   Married   Social Drivers of Health   Financial Resource Strain: Low Risk  (10/22/2023)   Overall Financial Resource Strain (CARDIA)    Difficulty of Paying Living Expenses: Not hard at all  Food Insecurity: No Food Insecurity (10/22/2023)   Hunger Vital Sign    Worried About Radiation protection practitioner of Food in the Last Year: Never true    Ran Out of Food in the Last Year: Never true  Transportation Needs: No Transportation Needs (10/22/2023)   PRAPARE - Administrator, Civil Service (Medical): No    Lack of Transportation (Non-Medical): No  Physical Activity: Sufficiently Active (10/22/2023)   Exercise Vital Sign    Days of Exercise per Week: 5 days    Minutes of Exercise per Session: 40 min  Stress: No Stress Concern Present (10/22/2023)   [employer] of Occupational Health - Occupational Stress Questionnaire    Feeling of Stress : Not at all  Social Connections:  Socially Integrated (10/22/2023)   Social Connection and Isolation Panel [NHANES]    Frequency of Communication with Friends and Family: More than three times a week    Frequency of Social Gatherings with Friends and Family: Three times a week    Attends Religious Services: More than 4 times per year    Active Member of Clubs or Organizations: Yes    Attends Engineer, structural: More than 4 times per year    Marital Status: Married    Tobacco Counseling Counseling given: No    Clinical Intake:  Pre-visit preparation completed: Yes  Pain : No/denies pain     BMI - recorded:  24.14 Nutritional Risks: None Diabetes: No  Lab Results  Component Value Date   HGBA1C 6.0 09/17/2022   HGBA1C 6.1 09/12/2021   HGBA1C 6.0 09/11/2020     How often do you need to have someone help you when you read instructions, pamphlets, or other written materials from your doctor or pharmacy?: 1 - Never  Interpreter Needed?: No  Information entered by :: Kandy Orris, CMA   Activities of Daily Living     10/22/2023   10:18 AM  In your present state of health, do you have any difficulty performing the following activities:  Hearing? 0  Vision? 0  Difficulty concentrating or making decisions? 0  Walking or climbing stairs? 0  Dressing or bathing? 0  Doing errands, shopping? 0  Preparing Food and eating ? N  Using the Toilet? N  In the past six months, have you accidently leaked urine? Y  Comment wears a pad sometimes  Do you have problems with loss of bowel control? N  Managing your Medications? N  Managing your Finances? N  Housekeeping or managing your Housekeeping? N    Patient Care Team: Roslyn Coombe, MD as PCP - General Lorna Rose Billye Buerger, MD (Radiology) Magrinat, Rozella Cornfield, MD (Inactive) as Consulting Physician (Oncology) Ladon Pickler, MD as Referring Physician (Ophthalmology)  Indicate any recent Medical Services you may have received from other than  [facility] providers in the past year (date may be approximate).     Assessment:    This is a routine wellness examination for Jaime Young.  Hearing/Vision screen Hearing Screening - Comments:: Wears hearing aids Vision Screening - Comments:: Wears rx glasses - up to date with routine eye exams with Dr Ansel Kingdom   Goals Addressed               This Visit's Progress     Patient Stated (pt-stated)        Patient stated she's started back walking with husband.        Depression Screen     10/22/2023   10:21 AM 10/22/2022    9:14 AM 09/17/2022    9:03 AM 09/19/2021    3:39 PM 09/12/2021    9:59 AM 09/12/2021    9:14 AM 09/11/2020    9:20 AM  PHQ 2/9 Scores  PHQ - 2 Score 0 0 0 0 0 0 0  PHQ- 9 Score 0 0         Fall Risk     10/22/2023   10:19 AM 06/11/2023   10:03 AM 10/20/2022    1:47 PM 10/17/2022    4:54 PM 09/17/2022    9:03 AM  Fall Risk   Falls in the past year? 0 0 0 0 0  Number falls in past yr: 0 0 0 0 0  Injury with Fall? 0 0 0 0 0  Risk for fall due to : No Fall Risks  No Fall Risks  No Fall Risks  Follow up Falls prevention discussed;Falls evaluation completed Falls evaluation completed Falls prevention discussed  Falls evaluation completed    MEDICARE RISK AT HOME:  Medicare Risk at Home Any stairs in or around the home?: No If so, are there any without handrails?: No Home free of loose throw rugs in walkways, pet beds, electrical cords, etc?: Yes Adequate lighting in your home to reduce risk of falls?: Yes Life alert?: No Use of a cane, walker or w/c?: No Grab bars in the bathroom?: Yes Shower chair or bench in shower?:  Yes Elevated toilet seat or a handicapped toilet?: Yes  TIMED UP AND GO:  Was the test performed?  No  Cognitive Function: 6CIT completed        10/22/2023   10:22 AM 10/20/2022    1:48 PM 09/19/2021    3:44 PM  6CIT Screen  What Year? 0 points 0 points 0 points  What month? 0 points 0 points 0 points  What time? 0 points 0 points 0 points   Count back from 20 0 points 0 points 0 points  Months in reverse 0 points 0 points 0 points  Repeat phrase 0 points 0 points 0 points  Total Score 0 points 0 points 0 points    Immunizations Immunization History  Administered Date(s) Administered   Fluad Quad(high Dose 65+) 03/04/2019, 06/14/2020, 02/27/2021   Hepatitis A, Adult 05/24/2018   Influenza, High Dose Seasonal PF 04/18/2016   PFIZER(Purple Top)SARS-COV-2 Vaccination 08/06/2019, 08/29/2019, 04/14/2020   Pneumococcal Conjugate-13 08/25/2016   Pneumococcal Polysaccharide-23 08/27/2017   Zoster, Live 07/12/2012    Screening Tests Health Maintenance  Topic Date Due   DTaP/Tdap/Td (1 - Tdap) Never done   Zoster Vaccines- Shingrix (1 of 2) 12/15/1968   Colonoscopy  02/01/2021   COVID-19 Vaccine (4 - 2024-25 season) 02/15/2023   INFLUENZA VACCINE  01/15/2024   Medicare Annual Wellness (AWV)  10/21/2024   MAMMOGRAM  12/07/2024   Pneumonia Vaccine 11+ Years old  Completed   DEXA SCAN  Completed   Hepatitis C Screening  Completed   HPV VACCINES  Aged Out   Meningococcal B Vaccine  Aged Out    Health Maintenance  Health Maintenance Due  Topic Date Due   DTaP/Tdap/Td (1 - Tdap) Never done   Zoster Vaccines- Shingrix (1 of 2) 12/15/1968   Colonoscopy  02/01/2021   COVID-19 Vaccine (4 - 2024-25 season) 02/15/2023   Health Maintenance Items Addressed:  Referral sent to GI for colonoscopy - Referral sent by PCP.  Pt stated will contact office to schedule.  Additional Screening:  Vision Screening: Recommended annual ophthalmology exams for early detection of glaucoma and other disorders of the eye.  Dental Screening: Recommended annual dental exams for proper oral hygiene  Community Resource Referral / Chronic Care Management: CRR required this visit?  No   CCM required this visit?  No     Plan:     I have personally reviewed and noted the following in the patient's chart:   Medical and social history Use  of alcohol, tobacco or illicit drugs  Current medications and supplements including opioid prescriptions. Patient is not currently taking opioid prescriptions. Functional ability and status Nutritional status Physical activity Advanced directives List of other physicians Hospitalizations, surgeries, and ER visits in previous 12 months Vitals Screenings to include cognitive, depression, and falls Referrals and appointments  In addition, I have reviewed and discussed with patient certain preventive protocols, quality metrics, and best practice recommendations. A written personalized care plan for preventive services as well as general preventive health recommendations were provided to patient.     Patria Bookbinder, CMA   10/22/2023   After Visit Summary: (MyChart) Due to this being a telephonic visit, the after visit summary with patients personalized plan was offered to patient via MyChart   Notes: Nothing significant to report at this time.

## 2023-11-05 NOTE — Telephone Encounter (Signed)
 error

## 2023-11-12 ENCOUNTER — Encounter: Payer: Self-pay | Admitting: Internal Medicine

## 2023-11-12 ENCOUNTER — Ambulatory Visit: Payer: Self-pay | Admitting: Internal Medicine

## 2023-11-12 ENCOUNTER — Ambulatory Visit (INDEPENDENT_AMBULATORY_CARE_PROVIDER_SITE_OTHER): Admitting: Internal Medicine

## 2023-11-12 VITALS — BP 120/72 | HR 65 | Temp 98.4°F | Ht 62.0 in | Wt 134.0 lb

## 2023-11-12 DIAGNOSIS — K635 Polyp of colon: Secondary | ICD-10-CM

## 2023-11-12 DIAGNOSIS — E559 Vitamin D deficiency, unspecified: Secondary | ICD-10-CM | POA: Diagnosis not present

## 2023-11-12 DIAGNOSIS — E538 Deficiency of other specified B group vitamins: Secondary | ICD-10-CM

## 2023-11-12 DIAGNOSIS — R7302 Impaired glucose tolerance (oral): Secondary | ICD-10-CM

## 2023-11-12 DIAGNOSIS — E785 Hyperlipidemia, unspecified: Secondary | ICD-10-CM

## 2023-11-12 DIAGNOSIS — J309 Allergic rhinitis, unspecified: Secondary | ICD-10-CM | POA: Diagnosis not present

## 2023-11-12 DIAGNOSIS — R32 Unspecified urinary incontinence: Secondary | ICD-10-CM | POA: Diagnosis not present

## 2023-11-12 LAB — VITAMIN B12: Vitamin B-12: 1472 pg/mL — ABNORMAL HIGH (ref 211–911)

## 2023-11-12 LAB — LIPID PANEL
Cholesterol: 261 mg/dL — ABNORMAL HIGH (ref 0–200)
HDL: 47.6 mg/dL (ref >39.00)
LDL Cholesterol: 187 mg/dL — ABNORMAL HIGH (ref 0–99)
NonHDL: 213.16
Total CHOL/HDL Ratio: 5
Triglycerides: 131 mg/dL (ref 0.0–149.0)
VLDL: 26.2 mg/dL (ref 0.0–40.0)

## 2023-11-12 LAB — CBC WITH DIFFERENTIAL/PLATELET
Basophils Absolute: 0 10*3/uL (ref 0.0–0.1)
Basophils Relative: 0.9 % (ref 0.0–3.0)
Eosinophils Absolute: 0.2 10*3/uL (ref 0.0–0.7)
Eosinophils Relative: 4.1 % (ref 0.0–5.0)
HCT: 44.5 % (ref 36.0–46.0)
Hemoglobin: 14.8 g/dL (ref 12.0–15.0)
Lymphocytes Relative: 25.2 % (ref 12.0–46.0)
Lymphs Abs: 1 10*3/uL (ref 0.7–4.0)
MCHC: 33.3 g/dL (ref 30.0–36.0)
MCV: 90.3 fl (ref 78.0–100.0)
Monocytes Absolute: 0.2 10*3/uL (ref 0.1–1.0)
Monocytes Relative: 5.8 % (ref 3.0–12.0)
Neutro Abs: 2.6 10*3/uL (ref 1.4–7.7)
Neutrophils Relative %: 64 % (ref 43.0–77.0)
Platelets: 253 10*3/uL (ref 150.0–400.0)
RBC: 4.92 Mil/uL (ref 3.87–5.11)
RDW: 12.7 % (ref 11.5–15.5)
WBC: 4.1 10*3/uL (ref 4.0–10.5)

## 2023-11-12 LAB — HEPATIC FUNCTION PANEL
ALT: 9 U/L (ref 0–35)
AST: 18 U/L (ref 0–37)
Albumin: 4.7 g/dL (ref 3.5–5.2)
Alkaline Phosphatase: 49 U/L (ref 39–117)
Bilirubin, Direct: 0.1 mg/dL (ref 0.0–0.3)
Total Bilirubin: 0.3 mg/dL (ref 0.2–1.2)
Total Protein: 7.1 g/dL (ref 6.0–8.3)

## 2023-11-12 LAB — TSH: TSH: 3.97 u[IU]/mL (ref 0.35–5.50)

## 2023-11-12 LAB — URINALYSIS, ROUTINE W REFLEX MICROSCOPIC
Bilirubin Urine: NEGATIVE
Hgb urine dipstick: NEGATIVE
Ketones, ur: NEGATIVE
Leukocytes,Ua: NEGATIVE
Nitrite: NEGATIVE
RBC / HPF: NONE SEEN (ref 0–?)
Specific Gravity, Urine: <=1.005 — AB (ref 1.000–1.030)
Total Protein, Urine: NEGATIVE
Urine Glucose: NEGATIVE
Urobilinogen, UA: 0.2 (ref 0.0–1.0)
pH: 7 (ref 5.0–8.0)

## 2023-11-12 LAB — BASIC METABOLIC PANEL WITH GFR
BUN: 10 mg/dL (ref 6–23)
CO2: 29 meq/L (ref 19–32)
Calcium: 9.4 mg/dL (ref 8.4–10.5)
Chloride: 105 meq/L (ref 96–112)
Creatinine, Ser: 0.82 mg/dL (ref 0.40–1.20)
GFR: 70.72 mL/min (ref >60.00)
Glucose, Bld: 84 mg/dL (ref 70–99)
Potassium: 3.9 meq/L (ref 3.5–5.1)
Sodium: 144 meq/L (ref 135–145)

## 2023-11-12 LAB — VITAMIN D 25 HYDROXY (VIT D DEFICIENCY, FRACTURES): VITD: 47.51 ng/mL (ref 30.00–100.00)

## 2023-11-12 LAB — HEMOGLOBIN A1C: Hgb A1c MFr Bld: 6.1 % (ref 4.6–6.5)

## 2023-11-12 LAB — MICROALBUMIN / CREATININE URINE RATIO
Creatinine,U: 11.4 mg/dL
Microalb Creat Ratio: UNDETERMINED mg/g (ref 0.0–30.0)
Microalb, Ur: <0.7 mg/dL

## 2023-11-12 MED ORDER — SIMVASTATIN 40 MG PO TABS: 40.0000 mg | ORAL_TABLET | ORAL | 3 refills | Status: AC

## 2023-11-12 MED ORDER — SOLIFENACIN SUCCINATE 5 MG PO TABS: 5.0000 mg | ORAL_TABLET | Freq: Every day | ORAL | 3 refills | Status: DC

## 2023-11-12 NOTE — Progress Notes (Signed)
 Patient ID: Jaime Young, female   DOB: [birth year], 73 y.o.   MRN: [id number]         Chief Complaint:: yearly exam       HPI:  Jaime Young is a 73 y.o. female here overall doing ok, Pt denies chest pain, increased sob or doe, wheezing, orthopnea, PND, increased LE swelling, palpitations, dizziness or syncope.   Pt denies polydipsia, polyuria, or new focal neuro s/s.    Pt denies fever, wt loss, night sweats, loss of appetite, or other constitutional symptoms    Also s/p cataract surgu rymay 10 doing well; tolerating statin every other day now without muscle cramping.  Husband now with early dementia and to have dental surgury soon, but will need colonoscopy for later this yr (her as well) as they missed the scheduling deadline.  Having some new incontinence worsening recently but mostly with urgency frquency , but Denies urinary symptoms such as dysuria, flank pain, hematuria or n/v, fever, chills. Does have several wks ongoing nasal allergy symptoms with clearish congestion, itch and sneezing, without fever, pain, ST, cough, swelling or wheezing.   Wt Readings from Last 3 Encounters:  11/12/23 134 lb (60.8 kg)  10/22/23 132 lb (59.9 kg)  06/11/23 137 lb (62.1 kg)   BP Readings from Last 3 Encounters:  11/12/23 120/72  06/11/23 124/80  09/17/22 124/70   Immunization History  Administered Date(s) Administered   Fluad Quad(high Dose 65+) 03/04/2019, 06/14/2020, 02/27/2021   Hepatitis A, Adult 05/24/2018   Influenza, High Dose Seasonal PF 04/18/2016   PFIZER(Purple Top)SARS-COV-2 Vaccination 08/06/2019, 08/29/2019, 04/14/2020   Pneumococcal Conjugate-13 08/25/2016   Pneumococcal Polysaccharide-23 08/27/2017   Zoster, Live 07/12/2012   Health Maintenance Due  Topic Date Due   DTaP/Tdap/Td (1 - Tdap) Never done   Zoster Vaccines- Shingrix (1 of 2) 12/15/1968   Colonoscopy  02/01/2021      Past Medical History:  Diagnosis Date   Allergic rhinitis    Anxiety    Cancer (HCC)     stage I right breast cancer   Diverticulitis    Diverticulosis of colon    DJD (degenerative joint disease) of lumbar spine    DVT (deep venous thrombosis) (HCC) 1977   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   Hyperlipidemia    Impaired glucose tolerance 11/17/2010   Migraine    Osteopenia 09/18/2010   Post-operative nausea and vomiting    RADIAL NERVE INJURY    Raynaud's disease    RAYNAUD'S DISEASE    SUPERFICIAL PHLEBITIS 09/01/2007   Past Surgical History:  Procedure Laterality Date   BACK SURGERY  2002   BREAST LUMPECTOMY Right 2011   axillary node dissection   BREAST SURGERY  1989/2001   implants   HEMORRHOID SURGERY  1983   PARS PLANA VITRECTOMY Left 2013   w/Membranectomy   TONSILLECTOMY     TOTAL ABDOMINAL HYSTERECTOMY W/ BILATERAL SALPINGOOPHORECTOMY  1983   secondary to edometriosis    reports that she has never smoked. She has never been exposed to tobacco smoke. She has never used smokeless tobacco. She reports that she does not drink alcohol and does not use drugs. family history includes Bone cancer in an other family member; Breast cancer in an other family member; Cancer in her father and sister; Esophageal cancer in her father; Heart disease in her father; Hypertension in her mother; Stomach cancer in her sister and another family member; Stroke in her mother. Allergies  Allergen Reactions  Dextromethorphan Swelling   Ciprofloxacin Other (See Comments)    Pt denies   Codeine Nausea Only   Hydrocodone  Nausea And Vomiting    Pt indicates N/V occurs with all narcotics.    Penicillins Hives   Sulfonamide Derivatives Nausea Only   Aflibercept Rash   Current Outpatient Medications on File Prior to Visit  Medication Sig Dispense Refill   aspirin-acetaminophen-caffeine (EXCEDRIN MIGRAINE) 250-250-65 MG tablet Take 1 tablet by mouth as needed for headache.     brimonidine -timolol  (COMBIGAN) 0.2-0.5 % ophthalmic solution Place 1 drop into the left eye 2  (two) times daily.      Calcium -Magnesium-Vitamin D  500-250-200 MG-MG-UNIT TABS Take 2 tablets by mouth 2 (two) times daily.      Multiple Vitamins tablet Take 1 tablet by mouth daily. Reported on 08/02/2015     Omega-3 Fatty Acids (FISH OIL) 300 MG CAPS Take 1 capsule by mouth daily.     timolol  (TIMOPTIC ) 0.5 % ophthalmic solution Place 1 drop into both eyes 2 (two) times daily.     vitamin C (ASCORBIC ACID) 500 MG tablet Take 1,000 mg by mouth daily.     No current facility-administered medications on file prior to visit.        ROS:  All others reviewed and negative.  Objective        PE:  BP 120/72 (BP Location: Right Arm, Patient Position: Sitting, Cuff Size: Normal)   Pulse 65   Temp 98.4 F (36.9 C) (Oral)   Ht 5\' 2"  (1.575 m)   Wt 134 lb (60.8 kg)   SpO2 98%   BMI 24.51 kg/m                 Constitutional: Pt appears in NAD               HENT: Head: NCAT.                Right Ear: External ear normal.                 Left Ear: External ear normal.                Eyes: . Pupils are equal, round, and reactive to light. Conjunctivae and EOM are normal               Nose: without d/c or deformity               Neck: Neck supple. Gross normal ROM               Cardiovascular: Normal rate and regular rhythm.                 Pulmonary/Chest: Effort normal and breath sounds without rales or wheezing.                Abd:  Soft, NT, ND, + BS, no organomegaly               Neurological: Pt is alert. At baseline orientation, motor grossly intact               Skin: Skin is warm. No rashes, no other new lesions, LE edema - none               Psychiatric: Pt behavior is normal without agitation   Micro: none  Cardiac tracings I have personally interpreted today:  none  Pertinent Radiological findings (summarize): none   Lab Results  Component Value Date  WBC 4.1 11/12/2023   HGB 14.8 11/12/2023   HCT 44.5 11/12/2023   PLT 253.0 11/12/2023   GLUCOSE 84 11/12/2023   CHOL  261 (H) 11/12/2023   TRIG 131.0 11/12/2023   HDL 47.60 11/12/2023   LDLDIRECT 160.6 12/20/2007   LDLCALC 187 (H) 11/12/2023   ALT 9 11/12/2023   AST 18 11/12/2023   NA 144 11/12/2023   K 3.9 11/12/2023   CL 105 11/12/2023   CREATININE 0.82 11/12/2023   BUN 10 11/12/2023   CO2 29 11/12/2023   TSH 3.97 11/12/2023   INR 1.00 02/04/2010   HGBA1C 6.1 11/12/2023   MICROALBUR <0.7 11/12/2023   Assessment/Plan:  MOMO BRAUN is a 73 y.o. White or Caucasian [1] female with  has a past medical history of Allergic rhinitis, Anxiety, Cancer (HCC), Diverticulitis, Diverticulosis of colon, DJD (degenerative joint disease) of lumbar spine, DVT (deep venous thrombosis) (HCC) (1977), Hearing loss of both ears, Hyperlipidemia, Impaired glucose tolerance (11/17/2010), Migraine, Osteopenia (09/18/2010), Post-operative nausea and vomiting, RADIAL NERVE INJURY, Raynaud's disease, RAYNAUD'S DISEASE, and SUPERFICIAL PHLEBITIS (09/01/2007).  Hyperlipidemia Lab Results  Component Value Date   LDLCALC 187 (H) 11/12/2023   Severe uncontrolled, pt declines to take statin, wants to work on diet   Impaired glucose tolerance Lab Results  Component Value Date   HGBA1C 6.1 11/12/2023   Stable, pt to continue current medical treatment  - diet, wt control   Urinary incontinence With frequency likely OAB, for ua and cx bu also trial vesicare  5 mg every day, consider urology if not improved  Allergic rhinitis Mild to mod, for otc allegra and /or nasacort prn,  to f/u any worsening symptoms or concerns  Followup: Return in about 6 months (around 05/14/2024).  Rosalia Colonel, MD 11/14/2023 9:19 AM [facility] Internal Medicine

## 2023-11-12 NOTE — Patient Instructions (Addendum)
 Please have your Shingrix (shingles) shots done at your local pharmacy, and the Tdap tetanus as well  Please take all new medication as prescribed  - the vesicare  Please continue all other medications as before, and refills have been done if requested.  Please have the pharmacy call with any other refills you may need.  Please continue your efforts at being more active, low cholesterol diet, and weight control.  You are otherwise up to date with prevention measures today.  Please keep your appointments with your specialists as you may have planned  You will be contacted regarding the referral for: colonoscopy for you and your husband  Please go to the LAB at the blood drawing area for the tests to be done  You will be contacted by phone if any changes need to be made immediately.  Otherwise, you will receive a letter about your results with an explanation, but please check with MyChart first.  Please make an Appointment to return in 6 months, or sooner if needed

## 2023-11-13 LAB — URINE CULTURE: Result:: NO GROWTH

## 2023-11-14 ENCOUNTER — Encounter: Payer: Self-pay | Admitting: Internal Medicine

## 2023-11-14 NOTE — Assessment & Plan Note (Signed)
 With frequency likely OAB, for ua and cx bu also trial vesicare  5 mg every day, consider urology if not improved

## 2023-11-14 NOTE — Assessment & Plan Note (Signed)
 Lab Results  Component Value Date   HGBA1C 6.1 11/12/2023   Stable, pt to continue current medical treatment  - diet, wt control

## 2023-11-14 NOTE — Assessment & Plan Note (Signed)
 Mild to mod, for otc allegra and /or nasacort prn,  to f/u any worsening symptoms or concerns

## 2023-11-14 NOTE — Assessment & Plan Note (Signed)
 Lab Results  Component Value Date   LDLCALC 187 (H) 11/12/2023   Severe uncontrolled, pt declines to take statin, wants to work on diet

## 2023-11-26 ENCOUNTER — Ambulatory Visit: Payer: Medicare Other | Admitting: Dermatology

## 2023-12-11 ENCOUNTER — Encounter: Payer: Self-pay | Admitting: Gastroenterology

## 2023-12-14 LAB — HM MAMMOGRAPHY

## 2023-12-17 ENCOUNTER — Encounter: Payer: Self-pay | Admitting: Internal Medicine

## 2024-02-16 ENCOUNTER — Ambulatory Visit (AMBULATORY_SURGERY_CENTER)

## 2024-02-16 VITALS — Ht 62.0 in | Wt 130.0 lb

## 2024-02-16 DIAGNOSIS — Z860100 Personal history of colon polyps, unspecified: Secondary | ICD-10-CM

## 2024-02-16 MED ORDER — NA SULFATE-K SULFATE-MG SULF 17.5-3.13-1.6 GM/177ML PO SOLN: 1.0000 | Freq: Once | ORAL | 0 refills | Status: AC

## 2024-02-16 NOTE — Progress Notes (Signed)
 No egg or soy allergy known to patient  No issues known to pt with past sedation with any surgeries or procedures Patient denies ever being told they had issues or difficulty with intubation  No FH of Malignant Hyperthermia Pt is not on diet pills Pt is not on  home 02  Pt is not on blood thinners  Pt denies issues with constipation- stool softener daily  No A fib or A flutter Have any cardiac testing pending--NO Pt can ambulate- independently   Pt denies use of chewing tobacco Discussed diabetic I weight loss medication holds Discussed NSAID holds Checked BMI Pt instructed to use Singlecare.com or GoodRx for a price reduction on prep  Patient's chart reviewed by Norleen Schillings CNRA prior to previsit and patient appropriate for the [facility].  Pre visit completed and red dot placed by patient's name on their procedure day (on provider's schedule).

## 2024-03-01 ENCOUNTER — Ambulatory Visit: Admitting: Gastroenterology

## 2024-03-01 ENCOUNTER — Encounter: Payer: Self-pay | Admitting: Gastroenterology

## 2024-03-01 VITALS — BP 136/83 | HR 78 | Temp 97.3°F | Resp 12 | Ht 62.0 in | Wt 130.0 lb

## 2024-03-01 DIAGNOSIS — K648 Other hemorrhoids: Secondary | ICD-10-CM | POA: Diagnosis not present

## 2024-03-01 DIAGNOSIS — Z1211 Encounter for screening for malignant neoplasm of colon: Secondary | ICD-10-CM | POA: Diagnosis not present

## 2024-03-01 DIAGNOSIS — K5730 Diverticulosis of large intestine without perforation or abscess without bleeding: Secondary | ICD-10-CM | POA: Diagnosis not present

## 2024-03-01 DIAGNOSIS — Z860100 Personal history of colon polyps, unspecified: Secondary | ICD-10-CM

## 2024-03-01 DIAGNOSIS — K644 Residual hemorrhoidal skin tags: Secondary | ICD-10-CM | POA: Diagnosis not present

## 2024-03-01 DIAGNOSIS — Z860101 Personal history of adenomatous and serrated colon polyps: Secondary | ICD-10-CM

## 2024-03-01 MED ORDER — SODIUM CHLORIDE 0.9 % IV SOLN: 500.0000 mL | INTRAVENOUS | Status: DC

## 2024-03-01 NOTE — Progress Notes (Unsigned)
 [facility] History and Physical   Primary Care Physician:  Norleen Lynwood ORN, MD   Reason for Procedure:  History of adenomatous colon polyps  Plan:    Surveillance colonoscopy with possible interventions as needed     HPI: Jaime Young is a very pleasant 73 y.o. female here for surveillance colonoscopy. Denies any nausea, vomiting, abdominal pain, melena or bright red blood per rectum  The risks and benefits as well as alternatives of endoscopic procedure(s) have been discussed and reviewed. All questions answered. The patient agrees to proceed.    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   Hyperlipidemia    Impaired glucose tolerance 11/17/2010   Migraine    Osteopenia 09/18/2010   Post-operative nausea and vomiting    RADIAL NERVE INJURY    Raynaud's disease    RAYNAUD'S DISEASE    SUPERFICIAL PHLEBITIS 09/01/2007    Past Surgical History:  Procedure Laterality Date   BACK SURGERY  06/16/2000   BREAST LUMPECTOMY Right 06/16/2009   axillary node dissection   BREAST SURGERY  1989/2001   implants   CATARACT EXTRACTION Right    April 2025   HEMORRHOID SURGERY  06/16/1981   PARS PLANA VITRECTOMY Left 06/17/2011   w/Membranectomy   TONSILLECTOMY     TOTAL ABDOMINAL HYSTERECTOMY W/ BILATERAL SALPINGOOPHORECTOMY  06/16/1981   secondary to edometriosis    Prior to Admission medications   Medication Sig Start Date End Date Taking? Authorizing Provider  acetaminophen (TYLENOL) 500 MG tablet Take 500 mg by mouth every 6 (six) hours as needed for headache.   Yes [provider]  timolol  (TIMOPTIC ) 0.5 % ophthalmic solution Place 1 drop into both eyes 2 (two) times daily. 06/18/22  Yes [provider]  aspirin-acetaminophen-caffeine (EXCEDRIN MIGRAINE) 250-250-65 MG tablet Take 1 tablet by mouth as needed for headache. 01/27/21   [provider]  brimonidine -timolol  (COMBIGAN) 0.2-0.5 % ophthalmic solution Place 1 drop into the left eye 2 (two) times daily.     [provider]  Calcium -Magnesium-Vitamin D  500-250-200 MG-MG-UNIT TABS Take 2 tablets by mouth 2 (two) times daily.     [provider]  Multiple Vitamins tablet Take 1 tablet by mouth daily. Reported on 08/02/2015    [provider]  Omega-3 Fatty Acids (FISH OIL) 300 MG CAPS Take 1 capsule by mouth daily.    [provider]  simvastatin  (ZOCOR ) 40 MG tablet Take 1 tablet (40 mg total) by mouth every other day. 11/12/23   Norleen Lynwood ORN, MD  vitamin C (ASCORBIC ACID) 500 MG tablet Take 1,000 mg by mouth daily.    [provider]    Current Outpatient Medications  Medication Sig Dispense Refill   acetaminophen (TYLENOL) 500 MG tablet Take 500 mg by mouth every 6 (six) hours as needed for headache.     timolol  (TIMOPTIC ) 0.5 % ophthalmic solution Place 1 drop into both eyes 2 (two) times daily.     aspirin-acetaminophen-caffeine (EXCEDRIN MIGRAINE) 250-250-65 MG tablet Take 1 tablet by mouth as needed for headache.     brimonidine -timolol  (COMBIGAN) 0.2-0.5 % ophthalmic solution Place 1 drop into the left eye  2 (two) times daily.      Calcium -Magnesium-Vitamin D  500-250-200 MG-MG-UNIT TABS Take 2 tablets by mouth 2 (two) times daily.      Multiple Vitamins tablet Take 1 tablet by mouth daily. Reported on 08/02/2015     Omega-3 Fatty Acids (FISH OIL) 300 MG CAPS Take 1 capsule by mouth daily.     simvastatin  (ZOCOR ) 40 MG tablet Take 1 tablet (40 mg total) by mouth every other day. 45 tablet 3   vitamin C (ASCORBIC ACID) 500 MG tablet Take 1,000 mg by mouth daily.     Current Facility-Administered Medications  Medication Dose Route Frequency Provider Last Rate Last Admin    0.9 %  sodium chloride  infusion  500 mL Intravenous Continuous Turner Baillie V, MD        Allergies as of 03/01/2024 - Review Complete 03/01/2024  Allergen Reaction Noted   Dextromethorphan Swelling 07/31/2015   Aflibercept Rash 01/16/2014   Ciprofloxacin Other (See Comments) 09/01/2007   Codeine Nausea Only 09/01/2007   Hydrocodone  Nausea And Vomiting 08/02/2015   Penicillins Hives 09/01/2007   Sulfonamide derivatives Nausea Only 09/01/2007    Family History  Problem Relation Age of Onset   Stroke Mother    Hypertension Mother    Heart disease Father    Cancer Father    Esophageal cancer Father    Cancer Sister    Stomach cancer Sister    Breast cancer Other    Bone cancer Other    Stomach cancer Other    Colon cancer Neg Hx    Rectal cancer Neg Hx     Social History   Socioeconomic History   Marital status: Married    Spouse name: Not on file   Number of children: 1   Years of education: Not on file   Highest education level: Not on file  Occupational History   Occupation: ACCOUNT EXECUTIVE    Employer: [employer]   Occupation: Systems developer  Tobacco Use   Smoking status: Never    Passive exposure: Never   Smokeless tobacco: Never  Vaping Use   Vaping status: Never Used  Substance and Sexual Activity   Alcohol use: No   Drug use: No   Sexual activity: Yes  Other Topics Concern   Not on file  Social History Narrative   Married   Social Drivers of Health   Financial Resource Strain: Low Risk  (10/22/2023)   Overall Financial Resource Strain (CARDIA)    Difficulty of Paying Living Expenses: Not hard at all  Food Insecurity: No Food Insecurity (10/22/2023)   Hunger Vital Sign    Worried About Running Out of Food in the Last Year: Never true    Ran Out of Food in the Last Year: Never true  Transportation Needs: No Transportation Needs (10/22/2023)   PRAPARE - Administrator, Civil Service (Medical): No    Lack of Transportation (Non-Medical):  No  Physical Activity: Sufficiently Active (10/22/2023)   Exercise Vital Sign    Days of Exercise per Week: 5 days    Minutes of Exercise per Session: 40 min  Stress: No Stress Concern Present (10/22/2023)   [employer] of Occupational Health - Occupational Stress Questionnaire    Feeling of Stress : Not at all  Social Connections: Socially Integrated (10/22/2023)   Social Connection and Isolation Panel    Frequency of Communication with Friends and Family: More than three times a week    Frequency of Social Gatherings with Friends and  Family: Three times a week    Attends Religious Services: More than 4 times per year    Active Member of Clubs or Organizations: Yes    Attends Banker Meetings: More than 4 times per year    Marital Status: Married  Catering manager Violence: Not At Risk (10/22/2023)   Humiliation, Afraid, Rape, and Kick questionnaire    Fear of Current or Ex-Partner: No    Emotionally Abused: No    Physically Abused: No    Sexually Abused: No    Review of Systems:  All other review of systems negative except as mentioned in the HPI.  Physical Exam: Vital signs in last 24 hours: BP 126/85   Pulse 84   Temp (!) 97.3 F (36.3 C) (Temporal)   Ht 5' 2 (1.575 m)   Wt 130 lb (59 kg)   SpO2 99%   BMI 23.78 kg/m  General:   Alert, NAD Lungs:  Clear .   Heart:  Regular rate and rhythm Abdomen:  Soft, nontender and nondistended. Neuro/Psych:  Alert and cooperative. Normal mood and affect. A and O x 3  Reviewed labs, radiology imaging, old records and pertinent past GI work up  Patient is appropriate for planned procedure(s) and anesthesia in an ambulatory setting   K. Veena Trampus Mcquerry , MD [phone]

## 2024-03-01 NOTE — Progress Notes (Unsigned)
 To pacu, VSS. Report to [email or web address]

## 2024-03-01 NOTE — Progress Notes (Unsigned)
 Pt's states no medical or surgical changes since previsit or office visit.

## 2024-03-01 NOTE — Op Note (Signed)
 [facility] Patient Name: Jaime Young Procedure Date: 03/01/2024 9:49 AM MRN: [id number] Endoscopist: Gustav ALONSO Mcgee , MD, [id number] Age: 73 Referring MD:  Date of Birth: Mar 18, 1951 Gender: Female Account #: [id number] Procedure:                Colonoscopy Indications:              High risk colon cancer surveillance: Personal                            history of colonic polyps, High risk colon cancer                            surveillance: Personal history of adenoma less than                            10 mm in size, Last colonoscopy: 2015 Medicines:                Monitored Anesthesia Care Procedure:                Pre-Anesthesia Assessment:                           - Prior to the procedure, a History and Physical                            was performed, and patient medications and                            allergies were reviewed. The patient's tolerance of                            previous anesthesia was also reviewed. The risks                            and benefits of the procedure and the sedation                            options and risks were discussed with the patient.                            All questions were answered, and informed consent                            was obtained. Prior Anticoagulants: The patient has                            taken no anticoagulant or antiplatelet agents. ASA                            Grade Assessment: II - A patient with mild systemic                            disease. After reviewing the risks and benefits,  the patient was deemed in satisfactory condition to                            undergo the procedure.                           After obtaining informed consent, the colonoscope                            was passed under direct vision. Throughout the                            procedure, the patient's blood pressure, pulse, and                            oxygen  saturations  were monitored continuously. The                            Olympus Scope [id number] was introduced through the                            anus and advanced to the the cecum, identified by                            appendiceal orifice and ileocecal valve. The                            colonoscopy was performed without difficulty. The                            patient tolerated the procedure well. The quality                            of the bowel preparation was good. The ileocecal                            valve, appendiceal orifice, and rectum were                            photographed. Scope In: 10:11:35 AM Scope Out: 10:28:47 AM Scope Withdrawal Time: 0 hours 7 minutes 13 seconds  Total Procedure Duration: 0 hours 17 minutes 12 seconds  Findings:                 The perianal and digital rectal examinations were                            normal.                           A few medium-mouthed and small-mouthed diverticula                            were found in the sigmoid colon.  Non-bleeding external and internal hemorrhoids were                            found during retroflexion. The hemorrhoids were                            medium-sized. Complications:            No immediate complications. Estimated Blood Loss:     Estimated blood loss was minimal. Impression:               - Diverticulosis in the sigmoid colon.                           - Non-bleeding external and internal hemorrhoids.                           - No specimens collected. Recommendation:           - Patient has a contact number available for                            emergencies. The signs and symptoms of potential                            delayed complications were discussed with the                            patient. Return to normal activities tomorrow.                            Written discharge instructions were provided to the                            patient.                            - Resume previous diet.                           - Continue present medications.                           - No repeat colonoscopy due to age. Jaivian Battaglini V. Oliviya Gilkison, MD 03/01/2024 10:36:19 AM This report has been signed electronically.

## 2024-03-01 NOTE — Patient Instructions (Addendum)
 Diverticulosis and hemorrhoids handouts given. Resume previous diet. Continue previous medications. No repeat colonoscopy due to age.  YOU HAD AN ENDOSCOPIC PROCEDURE TODAY AT THE [facility]:   Refer to the procedure report that was given to you for any specific questions about what was found during the examination.  If the procedure report does not answer your questions, please call your gastroenterologist to clarify.  If you requested that your care partner not be given the details of your procedure findings, then the procedure report has been included in a sealed envelope for you to review at your convenience later.  YOU SHOULD EXPECT: Some feelings of bloating in the abdomen. Passage of more gas than usual.  Walking can help get rid of the air that was put into your GI tract during the procedure and reduce the bloating. If you had a lower endoscopy (such as a colonoscopy or flexible sigmoidoscopy) you may notice spotting of blood in your stool or on the toilet paper. If you underwent a bowel prep for your procedure, you may not have a normal bowel movement for a few days.  Please Note:  You might notice some irritation and congestion in your nose or some drainage.  This is from the oxygen  used during your procedure.  There is no need for concern and it should clear up in a day or so.  SYMPTOMS TO REPORT IMMEDIATELY:  Following lower endoscopy (colonoscopy or flexible sigmoidoscopy):  Excessive amounts of blood in the stool  Significant tenderness or worsening of abdominal pains  Swelling of the abdomen that is new, acute  Fever of 100F or higher    For urgent or emergent issues, a gastroenterologist can be reached at any hour by calling [phone]. Do not use MyChart messaging for urgent concerns.    DIET:  We do recommend a small meal at first, but then you may proceed to your regular diet.  Drink plenty of fluids but you should avoid alcoholic beverages for 24  hours.  ACTIVITY:  You should plan to take it easy for the rest of today and you should NOT DRIVE or use heavy machinery until tomorrow (because of the sedation medicines used during the test).    FOLLOW UP: Our staff will call the number listed on your records the next business day following your procedure.  We will call around 7:15- 8:00 am to check on you and address any questions or concerns that you may have regarding the information given to you following your procedure. If we do not reach you, we will leave a message.     If any biopsies were taken you will be contacted by phone or by letter within the next 1-3 weeks.  Please call us  at [phone] if you have not heard about the biopsies in 3 weeks.    SIGNATURES/CONFIDENTIALITY: You and/or your care partner have signed paperwork which will be entered into your electronic medical record.  These signatures attest to the fact that that the information above on your After Visit Summary has been reviewed and is understood.  Full responsibility of the confidentiality of this discharge information lies with you and/or your care-partner.

## 2024-03-02 ENCOUNTER — Telehealth: Payer: Self-pay

## 2024-03-02 NOTE — Telephone Encounter (Signed)
  Follow up Call-     03/01/2024    8:34 AM  Call back number  Post procedure Call Back phone  # [phone]  Permission to leave phone message Yes     Patient questions:  Do you have a fever, pain , or abdominal swelling? No. Pain Score  0 *  Have you tolerated food without any problems? Yes.   Have you been able to return to your normal activities? Yes.    Do you have any questions about your discharge instructions: Diet   No. Medications  No. Follow up visit  No.  Do you have questions or concerns about your Care? No.  Actions: * If pain score is 4 or above: No action needed, pain <4.

## 2024-07-25 ENCOUNTER — Encounter: Admitting: Family Medicine

## 2024-10-24 ENCOUNTER — Ambulatory Visit
